# Patient Record
Sex: Female | Born: 1980
Health system: Southern US, Community
[De-identification: ages and names within clinical notes are randomized; demographics above are authoritative.]

## PROBLEM LIST (undated history)

## (undated) DIAGNOSIS — F419 Anxiety disorder, unspecified: Secondary | ICD-10-CM

## (undated) DIAGNOSIS — K589 Irritable bowel syndrome without diarrhea: Secondary | ICD-10-CM

## (undated) DIAGNOSIS — K449 Diaphragmatic hernia without obstruction or gangrene: Secondary | ICD-10-CM

## (undated) DIAGNOSIS — K219 Gastro-esophageal reflux disease without esophagitis: Secondary | ICD-10-CM

## (undated) DIAGNOSIS — F32A Depression, unspecified: Secondary | ICD-10-CM

## (undated) HISTORY — DX: Diaphragmatic hernia without obstruction or gangrene: K44.9

## (undated) HISTORY — DX: Gastro-esophageal reflux disease without esophagitis: K21.9

## (undated) HISTORY — DX: Irritable bowel syndrome, unspecified: K58.9

## (undated) HISTORY — DX: Depression, unspecified: F32.A

## (undated) HISTORY — DX: Anxiety disorder, unspecified: F41.9

## (undated) HISTORY — PX: TONSILLECTOMY: SUR1361

---

## 2015-10-22 DIAGNOSIS — F419 Anxiety disorder, unspecified: Secondary | ICD-10-CM | POA: Insufficient documentation

## 2017-10-17 ENCOUNTER — Ambulatory Visit: Payer: BLUE CROSS/BLUE SHIELD | Admitting: Psychiatry

## 2017-10-17 ENCOUNTER — Encounter: Payer: Self-pay | Admitting: Psychiatry

## 2017-10-17 VITALS — BP 104/62 | HR 68 | Ht 65.0 in | Wt 140.0 lb

## 2017-10-17 DIAGNOSIS — F411 Generalized anxiety disorder: Secondary | ICD-10-CM | POA: Diagnosis not present

## 2017-10-17 DIAGNOSIS — F330 Major depressive disorder, recurrent, mild: Secondary | ICD-10-CM | POA: Diagnosis not present

## 2017-10-17 DIAGNOSIS — F5105 Insomnia due to other mental disorder: Secondary | ICD-10-CM | POA: Diagnosis not present

## 2017-10-17 DIAGNOSIS — F4310 Post-traumatic stress disorder, unspecified: Secondary | ICD-10-CM

## 2017-10-17 MED ORDER — VILAZODONE HCL 20 MG PO TABS: 20.0000 mg | ORAL_TABLET | Freq: Every day | ORAL | 0 refills | Status: DC

## 2017-10-17 MED ORDER — TRAZODONE HCL 100 MG PO TABS: 100.0000 mg | ORAL_TABLET | Freq: Every day | ORAL | 1 refills | Status: DC

## 2017-10-17 MED ORDER — CLONAZEPAM 1 MG PO TABS: ORAL_TABLET | ORAL | 1 refills | Status: DC

## 2017-10-17 NOTE — Progress Notes (Signed)
Breanna Young [id number] 09-21-1980 37 y.o.  Assessment: Plan:    PTSD (post-traumatic stress disorder) - Plan: Vilazodone HCl (VIIBRYD) 20 MG TABS, clonazePAM (KLONOPIN) 1 MG tablet  Generalized anxiety disorder - Plan: Vilazodone HCl (VIIBRYD) 20 MG TABS, clonazePAM (KLONOPIN) 1 MG tablet  Major depressive disorder, recurrent episode, mild (HCC) - Plan: Vilazodone HCl (VIIBRYD) 20 MG TABS  Insomnia due to mental disorder - Plan: traZODone (DESYREL) 100 MG tablet  Please see After Visit Summary for patient specific instructions.  Future Appointments  Date Time Provider [place]  01/23/2018 11:30 AM [name], PMHNP [facility] None    No orders of the defined types were placed in this encounter.      Subjective:   Patient ID:  Breanna Young is a 37 y.o. (DOB 1980/06/24) female.  Chief Complaint:  Chief Complaint  Patient presents with  . Anxiety  . Depression  . Insomnia    Anxiety  Presents for follow-up visit. Symptoms include insomnia. Patient reports no confusion, decreased concentration, nervous/anxious behavior, panic or suicidal ideas.    Depression         This is a chronic problem.  The problem has been gradually improving since onset.  Associated symptoms include insomnia.  Associated symptoms include no decreased concentration, no fatigue, no appetite change and no suicidal ideas.  Compliance with treatment is good.  Past medical history includes anxiety.   Insomnia  Primary symptoms: no sleep disturbance.  The problem has been gradually improving since onset. The treatment provided significant relief. PMH includes: depression.   Breanna Young presents to the office today for f/u for depression and anxiety. She reports "I feel so much better." Reports improved mood and energy and feels "happier." Reports depressed mood is "minimal, if any." Reports that her boyfriend has been traveling frequently with new job and that previously this would have  caused her significant distress and she is coping better than expected. Reports "I feel like I am coping better with things." Reports decreased irritability. Notices mild irritability monthly when she thinks she would have a period if she did not have IUD. Reports improved anxiety. Reports some continued generalized anxiety and worry and reports that this has decreased. Reports that physical s/s of anxiety have improved. No recent panic attacks. Reports vivid dreams but no nightmares. Difficulty going to bed and feeling tired and able to fall asleep easily once she goes to bed. Reports that working out later at night after work may be causing difficulty falling asleep. Reports that Trazodone is effective for her insomnia. Describes appetite as "healthy" and reports eating an adequate amount.Reports eating healthy snacks throughout the day.  Reports exercising for at least an hour 4-5 times a week. Energy and motivation is "much better." Reports improved concentration and "I still struggle with it a little bit, but not like  I had before." Denies SI.     Medications: I have reviewed the patient's current medications.  Allergies:  Allergies  Allergen Reactions  . Avelox [Moxifloxacin Hcl In Nacl]   . Dexlansoprazole   . Doxazosin     Past Medical History:  Diagnosis Date  . GERD (gastroesophageal reflux disease)   . IBS (irritable bowel syndrome)     Past Surgical History:  Procedure Laterality Date  . TONSILLECTOMY      Family History  Problem Relation Age of Onset  . Depression Mother   . Heart attack Father   . Diabetes Father   . Depression Maternal Grandmother   .  Endometrial cancer Maternal Grandmother   . Dementia Maternal Grandfather   . Diabetes Maternal Grandfather   . Dementia Paternal Grandmother     Social History   Socioeconomic History  . Marital status: Single    Spouse name: Not on file  . Number of children: Not on file  . Years of education: Not on file  .  Highest education level: Not on file  Occupational History  . Occupation: hair stylist  Social Needs  . Financial resource strain: Not on file  . Food insecurity:    Worry: Not on file    Inability: Not on file  . Transportation needs:    Medical: Not on file    Non-medical: Not on file  Tobacco Use  . Smoking status: Never Smoker  . Smokeless tobacco: Never Used  Substance and Sexual Activity  . Alcohol use: Not on file  . Drug use: Not on file  . Sexual activity: Yes    Partners: Male    Birth control/protection: IUD  Lifestyle  . Physical activity:    Days per week: 4 days    Minutes per session: 60 min  . Stress: Not on file  Relationships  . Social connections:    Talks on phone: Not on file    Gets together: Not on file    Attends religious service: Not on file    Active member of club or organization: Not on file    Attends meetings of clubs or organizations: Not on file    Relationship status: Not on file  . Intimate partner violence:    Fear of current or ex partner: Not on file    Emotionally abused: Not on file    Physically abused: Not on file    Forced sexual activity: Not on file  Other Topics Concern  . Not on file  Social History Narrative  . Not on file    Past Medical History, Surgical history, Social history, and Family history were reviewed and updated as appropriate.   Please see review of systems for further details on the patient's review from today.   Review of Systems:  Review of Systems  Constitutional: Negative for activity change, appetite change, fatigue and unexpected weight change.  Musculoskeletal: Negative for gait problem.  Neurological: Negative for tremors.  Psychiatric/Behavioral: Positive for depression. Negative for agitation, behavioral problems, confusion, decreased concentration, dysphoric mood, hallucinations, sleep disturbance and suicidal ideas. The patient has insomnia. The patient is not nervous/anxious and is not  hyperactive.     Objective:   Physical Exam:  BP 104/62   Pulse 68   Ht 5\' 5"  (1.651 m)   Wt 140 lb (63.5 kg)   BMI 23.30 kg/m   Physical Exam  Constitutional: She is oriented to person, place, and time. She appears well-developed. No distress.  Musculoskeletal: Normal range of motion.  Neurological: She is alert and oriented to person, place, and time. Coordination normal.  Psychiatric: Her speech is normal and behavior is normal. Judgment and thought content normal. Her mood appears not anxious. Her affect is not angry, not blunt, not labile and not inappropriate. Cognition and memory are normal. She does not exhibit a depressed mood.    Lab Review:  No results found for: NA, K, CL, CO2, GLUCOSE, BUN, CREATININE, CALCIUM, PROT, ALBUMIN, AST, ALT, ALKPHOS, BILITOT, GFRNONAA, GFRAA  No results found for: WBC, RBC, HGB, HCT, PLT, MCV, MCH, MCHC, RDW, LYMPHSABS, MONOABS, EOSABS, BASOSABS -------------------------------

## 2017-11-07 ENCOUNTER — Ambulatory Visit (INDEPENDENT_AMBULATORY_CARE_PROVIDER_SITE_OTHER): Payer: BLUE CROSS/BLUE SHIELD | Admitting: Psychiatry

## 2017-11-07 ENCOUNTER — Encounter: Payer: Self-pay | Admitting: Psychiatry

## 2017-11-07 DIAGNOSIS — F411 Generalized anxiety disorder: Secondary | ICD-10-CM | POA: Diagnosis not present

## 2017-11-07 DIAGNOSIS — F4310 Post-traumatic stress disorder, unspecified: Secondary | ICD-10-CM | POA: Diagnosis not present
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
## 2017-11-14 ENCOUNTER — Ambulatory Visit: Payer: BLUE CROSS/BLUE SHIELD | Admitting: Psychiatry

## 2017-11-14 DIAGNOSIS — F411 Generalized anxiety disorder: Secondary | ICD-10-CM | POA: Diagnosis not present

## 2017-11-14 DIAGNOSIS — F4310 Post-traumatic stress disorder, unspecified: Secondary | ICD-10-CM
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
## 2017-11-28 ENCOUNTER — Ambulatory Visit: Payer: BLUE CROSS/BLUE SHIELD | Admitting: Psychiatry

## 2017-11-28 DIAGNOSIS — F4310 Post-traumatic stress disorder, unspecified: Secondary | ICD-10-CM

## 2017-11-28 DIAGNOSIS — F411 Generalized anxiety disorder: Secondary | ICD-10-CM | POA: Diagnosis not present
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
## 2017-12-12 ENCOUNTER — Encounter: Payer: Self-pay | Admitting: Psychiatry

## 2017-12-12 ENCOUNTER — Ambulatory Visit: Payer: BLUE CROSS/BLUE SHIELD | Admitting: Psychiatry

## 2017-12-12 DIAGNOSIS — F4310 Post-traumatic stress disorder, unspecified: Secondary | ICD-10-CM

## 2017-12-12 NOTE — Progress Notes (Signed)
      [facility] Counselor/Therapist Progress Note  Patient ID: Breanna Young, MRN: [id number],    Date: 12/12/2017  Time Spent: 46 minutes  Treatment Type: Individual Therapy  Reported Symptoms: Anxious Mood and Fatigue,depression, anger, low motivation  Mental Status Exam:  Appearance:   Well Groomed     Behavior:  Appropriate  Motor:  Normal  Speech/Language:   Normal Rate  Affect:  Appropriate  Mood:  sad  Thought process:  normal  Thought content:    WNL  Sensory/Perceptual disturbances:    WNL  Orientation:  oriented to person, place and time/date  Attention:  Good  Concentration:  Good  Memory:  WNL  Fund of knowledge:   Good  Insight:    Good  Judgment:   Good  Impulse Control:  Good   Risk Assessment: Danger to Self:  No Self-injurious Behavior: No Danger to Others: No Duty to Warn:no Physical Aggression / Violence:No  Access to Firearms a concern: No  Gang Involvement:No   Subjective: Patient was present for session.  She stated at the beginning of session she was not feeling well and was not certain if she had a stomach virus or had food poisoning.  Agreed to keep the session light due to that reason.  Patient also reported that she is having lots of issues with being easily agitated.  She reported she is concerned that some of the agitation is coming because past memories are flashing up for her and she is becoming angrier with her mother.  Patient was allowed time to realize that the emotions are appropriate but need to be released in a good manner.  Discussed different ways to release her anger appropriately throughout her day.  Patient was encouraged to remember the emotions are going to come up very quickly so she needs to be proactive about releasing things regularly.  Ways to do that were discussed with patient.  Patient was given a list of options to take home and work on daily.  Patient was also encouraged to focus on her eating sleep and exercise.   Patient did admit that the nightmares have stopped which was very important for her.  Ways to continue taking care of herself were discussed with patient.  Interventions: Solution-Oriented/Positive Psychology  Diagnosis:   ICD-10-CM   1. PTSD (post-traumatic stress disorder) F43.10     Plan: 1.  Patient to continue to engage in individual counseling 2-4 times a month or as needed. 2.  Patient to identify and apply CBT, coping skills learned in session to decrease anxiety symptoms. 3.  Patient to contact this office, go to the local ED or call 911 if a crisis or emergency develops between visits.  Lina Sayre, [place]

## 2018-01-01 ENCOUNTER — Encounter: Payer: Self-pay | Admitting: Emergency Medicine

## 2018-01-01 DIAGNOSIS — F4310 Post-traumatic stress disorder, unspecified: Secondary | ICD-10-CM

## 2018-01-01 DIAGNOSIS — F332 Major depressive disorder, recurrent severe without psychotic features: Secondary | ICD-10-CM | POA: Insufficient documentation

## 2018-01-01 DIAGNOSIS — G4700 Insomnia, unspecified: Secondary | ICD-10-CM

## 2018-01-01 DIAGNOSIS — F410 Panic disorder [episodic paroxysmal anxiety] without agoraphobia: Secondary | ICD-10-CM | POA: Insufficient documentation

## 2018-01-02 ENCOUNTER — Encounter: Payer: Self-pay | Admitting: Psychiatry

## 2018-01-02 ENCOUNTER — Ambulatory Visit: Payer: BLUE CROSS/BLUE SHIELD | Admitting: Psychiatry

## 2018-01-02 DIAGNOSIS — F4310 Post-traumatic stress disorder, unspecified: Secondary | ICD-10-CM | POA: Diagnosis not present

## 2018-01-02 NOTE — Progress Notes (Signed)
      [facility] Counselor/Therapist Progress Note  Patient ID: Breanna Young, MRN: [id number],    Date: 01/02/2018  Time Spent: 52 minutes   Treatment Type: Individual Therapy  Reported Symptoms: Anxious Mood  Mental Status Exam:  Appearance:   Well Groomed     Behavior:  Appropriate  Motor:  Normal  Speech/Language:   Normal Rate  Affect:  Appropriate  Mood:  normal  Thought process:  normal  Thought content:    WNL  Sensory/Perceptual disturbances:    WNL  Orientation:  oriented to person, place and time/date  Attention:  Good  Concentration:  Good  Memory:  WNL  Fund of knowledge:   Good  Insight:    Good  Judgment:   Good  Impulse Control:  Good   Risk Assessment: Danger to Self:  No Self-injurious Behavior: No Danger to Others: No Duty to Warn:no Physical Aggression / Violence:No  Access to Firearms a concern: No  Gang Involvement:No   Subjective: Patient was present for session.  Patient reported she is having lots of issues with the anticipation of Christmas.  She shared she admitted through Thanksgiving but it was very difficult with her mother.  Also 1 of her pets passed away and that has been very difficult.  Patient shared that she did use her coping skills and found them to be helpful.  She was able to discuss some coping skills that can continue during her Christmas visit with her mother.  Other solutions were also discussed with patient.  Patient reported she had gotten to the point where she started having some negative thoughts again and she wanted to make sure that that did not happen.  The importance of releasing her emotions in healthy manners through exercise were discussed with patient and she agreed to get back into her regular routine at this time.  Interventions: Cognitive Behavioral Therapy and Solution-Oriented/Positive Psychology  Diagnosis:   ICD-10-CM   1. Posttraumatic stress disorder F43.10     Plan: 1.  Patient to continue to  engage in individual counseling 2-4 times a month or as needed. 2.  Patient to identify and apply CBT, coping skills learned in session to decrease triggered responses and anxiety symptoms. 3.  Patient to contact this office, go to the local ED or call 911 if a crisis or emergency develops between visits.  Lina Sayre, [place]

## 2018-01-23 ENCOUNTER — Encounter: Payer: Self-pay | Admitting: Psychiatry

## 2018-01-23 ENCOUNTER — Ambulatory Visit: Payer: BLUE CROSS/BLUE SHIELD | Admitting: Psychiatry

## 2018-01-23 VITALS — BP 115/71 | HR 72

## 2018-01-23 DIAGNOSIS — F330 Major depressive disorder, recurrent, mild: Secondary | ICD-10-CM | POA: Diagnosis not present

## 2018-01-23 DIAGNOSIS — F5105 Insomnia due to other mental disorder: Secondary | ICD-10-CM | POA: Diagnosis not present

## 2018-01-23 DIAGNOSIS — F411 Generalized anxiety disorder: Secondary | ICD-10-CM

## 2018-01-23 DIAGNOSIS — F4310 Post-traumatic stress disorder, unspecified: Secondary | ICD-10-CM

## 2018-01-23 MED ORDER — TRAZODONE HCL 100 MG PO TABS: 100.0000 mg | ORAL_TABLET | Freq: Every day | ORAL | 1 refills | Status: DC

## 2018-01-23 MED ORDER — CLONAZEPAM 1 MG PO TABS: ORAL_TABLET | ORAL | 2 refills | Status: DC

## 2018-01-23 MED ORDER — VILAZODONE HCL 20 MG PO TABS: 20.0000 mg | ORAL_TABLET | Freq: Every day | ORAL | 0 refills | Status: DC

## 2018-01-23 NOTE — Progress Notes (Signed)
Breanna Young [id number] 1980/04/05 38 y.o.  Subjective:   Patient ID:  Breanna Young is a 38 y.o. (DOB 10/14/1980) female.  Chief Complaint:  Chief Complaint  Patient presents with  . Anxiety  . Insomnia  . Depression    HPI Breanna Young presents to the office today for follow-up of depression and anxiety. She noticed increased irritability a few months ago. She reports that irritability has improved and thinks that some of the irritability may have been related to working through some issues in therapy. She reports that she had some nightmares and panic attacks in the middle of the night while doing more intense work from therapy. Reports that her motivation was lower around that time. She reports that her motivation is gradually improving. Reports that she had some anxiety over the holidays and that they holidays are typically a difficult time for her. She reports that she has some occasional mild irritability but what she would consider appropriate.   She reports having the start of a panic attack earlier this week while sleeping. Reports that she then had to take a klonopin during the middle of the night. She describes generalized anxiety as "middle of the road" and "not great." She reports that she has been feeling easily overwhelmed. She reports that anxiety was more manageable when she was exercising regularly and is trying to get back to that. Energy has been low. "I feel kind of sluggish, tired." Reports that she has been having difficulty falling asleep since boyfriend left for travel with work several days ago. She reports that her appetite has been "ok." Reports concentration has been "all over the place." Denies SI.   Reports that boyfriend was home for a few weeks around the holidays.  Reports that sexual side effects have decreased with Viibryd compared to Trintellix.   Past Psychiatric Medication Trials: Viibryd Trintellix Zoloft-sexual side effects Lexapro-adverse  reaction Wellbutrin XL-increased anxiety and felt fuzzy headed Rexulti-affect of dulling, weight gain Trazodone Ativan Xanax Klonopin Doxazosin-dissociation BuSpar-adverse reaction   Review of Systems:  Review of Systems  Gastrointestinal: Positive for diarrhea.  Musculoskeletal: Negative for gait problem.  Neurological: Negative for tremors.  Psychiatric/Behavioral:       Please refer to HPI    Medications: I have reviewed the patient's current medications.  Current Outpatient Medications  Medication Sig Dispense Refill  . CALCIUM-MAGNESIUM PO Take by mouth.    . clonazePAM (KLONOPIN) 1 MG tablet TAKE 1 TAB PO BID AND 1 TAB PO QD PRN ANXIETY 75 tablet 2  . Glucosamine-Chondroitin (GLUCOSAMINE CHONDR COMPLEX PO) Take 3 tablets by mouth.    . Lactobacillus (PROBIOTIC ACIDOPHILUS PO) Take by mouth.    . Multiple Vitamin (MULTIVITAMIN) LIQD Take 5 mLs by mouth daily.    Marland Kitchen omega-3 fish oil (MAXEPA) 1000 MG CAPS capsule Take 2 capsules by mouth daily.    . pantoprazole (PROTONIX) 40 MG tablet Take 40 mg by mouth daily.    . traZODone (DESYREL) 100 MG tablet Take 1 tablet (100 mg total) by mouth at bedtime. 90 tablet 1  . Vilazodone HCl (VIIBRYD) 20 MG TABS Take 1 tablet (20 mg total) by mouth daily. 90 tablet 0   No current facility-administered medications for this visit.     Medication Side Effects: None  Allergies:  Allergies  Allergen Reactions  . Avelox [Moxifloxacin Hcl In Nacl]   . Dexlansoprazole   . Doxazosin     Past Medical History:  Diagnosis Date  . GERD (gastroesophageal reflux disease)   .  IBS (irritable bowel syndrome)     Family History  Problem Relation Age of Onset  . Depression Mother   . Heart attack Father   . Diabetes Father   . Depression Maternal Grandmother   . Endometrial cancer Maternal Grandmother   . Dementia Maternal Grandfather   . Diabetes Maternal Grandfather   . Dementia Paternal Grandmother     Social History    Socioeconomic History  . Marital status: Single    Spouse name: Not on file  . Number of children: Not on file  . Years of education: Not on file  . Highest education level: Not on file  Occupational History  . Occupation: hair stylist  Social Needs  . Financial resource strain: Not on file  . Food insecurity:    Worry: Not on file    Inability: Not on file  . Transportation needs:    Medical: Not on file    Non-medical: Not on file  Tobacco Use  . Smoking status: Never Smoker  . Smokeless tobacco: Never Used  Substance and Sexual Activity  . Alcohol use: Not Currently    Alcohol/week: 2.0 standard drinks    Types: 2 Glasses of wine per week  . Drug use: Never  . Sexual activity: Yes    Partners: Male    Birth control/protection: I.U.D.  Lifestyle  . Physical activity:    Days per week: 4 days    Minutes per session: 60 min  . Stress: Not on file  Relationships  . Social connections:    Talks on phone: Not on file    Gets together: Not on file    Attends religious service: Not on file    Active member of club or organization: Not on file    Attends meetings of clubs or organizations: Not on file    Relationship status: Not on file  . Intimate partner violence:    Fear of current or ex partner: Not on file    Emotionally abused: Not on file    Physically abused: Not on file    Forced sexual activity: Not on file  Other Topics Concern  . Not on file  Social History Narrative  . Not on file    Past Medical History, Surgical history, Social history, and Family history were reviewed and updated as appropriate.   Please see review of systems for further details on the patient's review from today.   Objective:   Physical Exam:  BP 115/71   Pulse 72   Physical Exam Constitutional:      General: She is not in acute distress.    Appearance: She is well-developed.  Musculoskeletal:        General: No deformity.  Neurological:     Mental Status: She is  alert and oriented to person, place, and time.     Coordination: Coordination normal.  Psychiatric:        Mood and Affect: Mood is anxious and depressed. Affect is not labile, blunt, angry or inappropriate.        Speech: Speech normal.        Behavior: Behavior normal.        Thought Content: Thought content normal. Thought content does not include homicidal or suicidal ideation. Thought content does not include homicidal or suicidal plan.        Judgment: Judgment normal.     Comments: Insight intact. No auditory or visual hallucinations. No delusions.      Lab Review:  No  results found for: NA, K, CL, CO2, GLUCOSE, BUN, CREATININE, CALCIUM, PROT, ALBUMIN, AST, ALT, ALKPHOS, BILITOT, GFRNONAA, GFRAA  No results found for: WBC, RBC, HGB, HCT, PLT, MCV, MCH, MCHC, RDW, LYMPHSABS, MONOABS, EOSABS, BASOSABS  No results found for: POCLITH, LITHIUM   No results found for: PHENYTOIN, PHENOBARB, VALPROATE, CBMZ   .res Assessment: Plan:   Discussed option of increasing Viibryd to 40 mg daily or continuing current medications.  Patient reports that she would like to continue current medications and increase exercise and improve diet for the next month and then reevaluate possibly increasing Viibryd, particularly since she has noticed some recent improvement in mood and anxiety signs and symptoms.  Patient advised to contact office if signs and symptoms worsen. Continue Viibryd 20 mg daily. Continue Klonopin 1 mg twice daily and 1 tab p.o. daily as needed anxiety. Continue trazodone 100 mg p.o. nightly for insomnia.  PTSD (post-traumatic stress disorder) - Chronic with recent exacerbation - Plan: Vilazodone HCl (VIIBRYD) 20 MG TABS, clonazePAM (KLONOPIN) 1 MG tablet  Generalized anxiety disorder - Chronic with some recent worsening - Plan: Vilazodone HCl (VIIBRYD) 20 MG TABS, clonazePAM (KLONOPIN) 1 MG tablet  Major depressive disorder, recurrent episode, mild (HCC) - Chronic with some  recent worsening - Plan: Vilazodone HCl (VIIBRYD) 20 MG TABS  Insomnia due to mental disorder - Chronic with some recent intermittent awakenings due to anxiety - Plan: traZODone (DESYREL) 100 MG tablet  Please see After Visit Summary for patient specific instructions.  Future Appointments  Date Time Provider [place]  02/13/2018 12:00 PM Lina Sayre, [place] [facility] None  02/20/2018  3:00 PM Lina Sayre, [place] [facility] None  02/27/2018  1:00 PM [name], PMHNP CP-CP None  03/06/2018  1:00 PM Lina Sayre, [facility] None    No orders of the defined types were placed in this encounter.     -------------------------------

## 2018-01-30 ENCOUNTER — Ambulatory Visit: Payer: BLUE CROSS/BLUE SHIELD | Admitting: Psychiatry

## 2018-02-13 ENCOUNTER — Ambulatory Visit: Payer: BLUE CROSS/BLUE SHIELD | Admitting: Psychiatry

## 2018-02-13 ENCOUNTER — Encounter: Payer: Self-pay | Admitting: Psychiatry

## 2018-02-13 DIAGNOSIS — F4310 Post-traumatic stress disorder, unspecified: Secondary | ICD-10-CM | POA: Diagnosis not present
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
## 2018-02-20 ENCOUNTER — Ambulatory Visit: Payer: BLUE CROSS/BLUE SHIELD | Admitting: Psychiatry

## 2018-02-27 ENCOUNTER — Encounter: Payer: Self-pay | Admitting: Psychiatry

## 2018-02-27 ENCOUNTER — Ambulatory Visit: Payer: BLUE CROSS/BLUE SHIELD | Admitting: Psychiatry

## 2018-02-27 VITALS — BP 95/60 | HR 70

## 2018-02-27 DIAGNOSIS — F411 Generalized anxiety disorder: Secondary | ICD-10-CM | POA: Diagnosis not present

## 2018-02-27 DIAGNOSIS — F330 Major depressive disorder, recurrent, mild: Secondary | ICD-10-CM | POA: Diagnosis not present

## 2018-02-27 DIAGNOSIS — F4310 Post-traumatic stress disorder, unspecified: Secondary | ICD-10-CM | POA: Diagnosis not present

## 2018-02-27 MED ORDER — CLONAZEPAM 1 MG PO TABS: ORAL_TABLET | ORAL | 2 refills | Status: DC

## 2018-02-27 MED ORDER — VILAZODONE HCL 40 MG PO TABS: 40.0000 mg | ORAL_TABLET | Freq: Every day | ORAL | 0 refills | Status: DC

## 2018-02-27 NOTE — Progress Notes (Signed)
Breanna Young [id number] Jul 25, 1980 38 y.o.  Subjective:   Patient ID:  Breanna Young is a 38 y.o. (DOB [birth year]) female.  Chief Complaint:  Chief Complaint  Patient presents with  . Depression  . Insomnia  . Anxiety    HPI Breanna Young presents to the office today for follow-up of depression and anxiety. She reports "I think it is definitely time for use to go up" on Viibryd and notices depression. She reports that her energy and motivation remain low and she has not been able to get back on a set routine of exercise and health maintenance. Reports that she not feel that she has the energy to go to work. She reports that she has not been sleeping well and wakes up feeling very tired. Reports difficulty falling asleep and does not feel tired until much later than she would like to fall asleep. Reports that she has tried multiple strategies to help fall asleep to include apps, TV on and off, total darkness and silence, etc. Reports some occasional difficulty staying asleep. Reports that she has had a few nightmares. She reports that she tried taking Benadryl instead of Trazodone one night and "that was terrible" and had itching, diarrhea, and sweats and thinks that she had discontinuation s/s. Estimates sleeping 5-6 hours of sleep a night on average. Reports that even when she is able to sleep she does not feel rested or refreshed on awakening. Reports that her anxiety is "so, so." Reports that she has had several recent stressors to include multiple home repairs. Reports poor concentration and notices that she is often not paying attention to clients. Appetite has been "up and down." Reports that she has been craving unhealthy foods at times. Denies SI.  Past Psychiatric Medication Trials: Viibryd Trintellix Zoloft-sexual side effects Lexapro-adverse reaction Wellbutrin XL-increased anxiety and felt fuzzy headed Rexulti-affect of dulling, weight  gain Trazodone Ativan Xanax Klonopin Doxazosin-dissociation BuSpar-adverse reaction  Review of Systems:  Review of Systems  Musculoskeletal: Negative for gait problem.  Neurological: Positive for headaches. Negative for tremors.       Reports increase in dull HA's.  Psychiatric/Behavioral:       Please refer to HPI    Medications: I have reviewed the patient's current medications.  Current Outpatient Medications  Medication Sig Dispense Refill  . CALCIUM-MAGNESIUM PO Take by mouth.    Derrill Memo ON 04/24/2018] clonazePAM (KLONOPIN) 1 MG tablet TAKE 1 TAB PO BID AND 1 TAB PO QD PRN ANXIETY 75 tablet 2  . Glucosamine-Chondroitin (GLUCOSAMINE CHONDR COMPLEX PO) Take 3 tablets by mouth.    . Lactobacillus (PROBIOTIC ACIDOPHILUS PO) Take by mouth.    . Multiple Vitamin (MULTIVITAMIN) LIQD Take 5 mLs by mouth daily.    Marland Kitchen omega-3 fish oil (MAXEPA) 1000 MG CAPS capsule Take 2 capsules by mouth daily.    . pantoprazole (PROTONIX) 40 MG tablet Take 40 mg by mouth daily.    . traZODone (DESYREL) 100 MG tablet Take 1 tablet (100 mg total) by mouth at bedtime. 90 tablet 1  . Vilazodone HCl (VIIBRYD) 40 MG TABS Take 1 tablet (40 mg total) by mouth daily. 90 tablet 0   No current facility-administered medications for this visit.     Medication Side Effects: None  Allergies:  Allergies  Allergen Reactions  . Avelox [Moxifloxacin Hcl In Nacl]   . Dexlansoprazole   . Doxazosin     Past Medical History:  Diagnosis Date  . GERD (gastroesophageal reflux disease)   . IBS (irritable  bowel syndrome)     Family History  Problem Relation Age of Onset  . Depression Mother   . Heart attack Father   . Diabetes Father   . Depression Maternal Grandmother   . Endometrial cancer Maternal Grandmother   . Dementia Maternal Grandfather   . Diabetes Maternal Grandfather   . Dementia Paternal Grandmother     Social History   Socioeconomic History  . Marital status: Single    Spouse name: Not  on file  . Number of children: Not on file  . Years of education: Not on file  . Highest education level: Not on file  Occupational History  . Occupation: hair stylist  Social Needs  . Financial resource strain: Not on file  . Food insecurity:    Worry: Not on file    Inability: Not on file  . Transportation needs:    Medical: Not on file    Non-medical: Not on file  Tobacco Use  . Smoking status: Never Smoker  . Smokeless tobacco: Never Used  Substance and Sexual Activity  . Alcohol use: Not Currently    Alcohol/week: 2.0 standard drinks    Types: 2 Glasses of wine per week  . Drug use: Never  . Sexual activity: Yes    Partners: Male    Birth control/protection: I.U.D.  Lifestyle  . Physical activity:    Days per week: 4 days    Minutes per session: 60 min  . Stress: Not on file  Relationships  . Social connections:    Talks on phone: Not on file    Gets together: Not on file    Attends religious service: Not on file    Active member of club or organization: Not on file    Attends meetings of clubs or organizations: Not on file    Relationship status: Not on file  . Intimate partner violence:    Fear of current or ex partner: Not on file    Emotionally abused: Not on file    Physically abused: Not on file    Forced sexual activity: Not on file  Other Topics Concern  . Not on file  Social History Narrative  . Not on file    Past Medical History, Surgical history, Social history, and Family history were reviewed and updated as appropriate.   Please see review of systems for further details on the patient's review from today.   Objective:   Physical Exam:  BP 95/60   Pulse 70   Physical Exam Constitutional:      General: She is not in acute distress.    Appearance: She is well-developed.  Musculoskeletal:        General: No deformity.  Neurological:     Mental Status: She is alert and oriented to person, place, and time.     Coordination: Coordination  normal.  Psychiatric:        Attention and Perception: Attention and perception normal. She does not perceive auditory or visual hallucinations.        Mood and Affect: Mood is anxious and depressed. Affect is not labile, blunt, angry or inappropriate.        Speech: Speech normal.        Behavior: Behavior normal.        Thought Content: Thought content normal. Thought content does not include homicidal or suicidal ideation. Thought content does not include homicidal or suicidal plan.        Cognition and Memory: Cognition and memory  normal.        Judgment: Judgment normal.     Comments: Insight intact. No delusions.      Lab Review:  No results found for: NA, K, CL, CO2, GLUCOSE, BUN, CREATININE, CALCIUM, PROT, ALBUMIN, AST, ALT, ALKPHOS, BILITOT, GFRNONAA, GFRAA  No results found for: WBC, RBC, HGB, HCT, PLT, MCV, MCH, MCHC, RDW, LYMPHSABS, MONOABS, EOSABS, BASOSABS  No results found for: POCLITH, LITHIUM   No results found for: PHENYTOIN, PHENOBARB, VALPROATE, CBMZ   .res Assessment: Plan:   Discussed potential benefits, risks, and side effects of increasing Viibryd to 40 mg po qd to improve anxiety and depressive s/s since these s/s have partially improved with Viibryd 20 mg po qd. Pt agrees to increase in Viibryd to 40 mg po qd. Discussed insomnia may be due to acute exacerbation of anxiety and depression and may improve if increase in Viibryd is effective for these s/s. Discussed considering tx options for insomnia if sleep has not improved at time of f/u visit.  Continue Klonopin for anxiety. Recommend continuing therapy with Lina Sayre, LPC.   Major depressive disorder, recurrent episode, mild (HCC) - Plan: Vilazodone HCl (VIIBRYD) 40 MG TABS  PTSD (post-traumatic stress disorder) - Chronic with recent exacerbation - Plan: Vilazodone HCl (VIIBRYD) 40 MG TABS, clonazePAM (KLONOPIN) 1 MG tablet  Generalized anxiety disorder - Chronic with some recent worsening - Plan:  Vilazodone HCl (VIIBRYD) 40 MG TABS, clonazePAM (KLONOPIN) 1 MG tablet  Please see After Visit Summary for patient specific instructions.  Future Appointments  Date Time Provider [place]  03/06/2018  1:00 PM Lina Sayre, [place] [facility] None  04/03/2018  1:00 PM [name], PMHNP CP-CP None    No orders of the defined types were placed in this encounter.     -------------------------------

## 2018-03-06 ENCOUNTER — Ambulatory Visit: Payer: BLUE CROSS/BLUE SHIELD | Admitting: Psychiatry

## 2018-03-06 ENCOUNTER — Encounter: Payer: Self-pay | Admitting: Psychiatry

## 2018-03-06 DIAGNOSIS — F411 Generalized anxiety disorder: Secondary | ICD-10-CM | POA: Diagnosis not present

## 2018-03-06 DIAGNOSIS — F4310 Post-traumatic stress disorder, unspecified: Secondary | ICD-10-CM | POA: Diagnosis not present
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
## 2018-03-20 ENCOUNTER — Ambulatory Visit: Payer: BLUE CROSS/BLUE SHIELD | Admitting: Psychiatry

## 2018-04-03 ENCOUNTER — Other Ambulatory Visit: Payer: Self-pay

## 2018-04-03 ENCOUNTER — Ambulatory Visit: Payer: BLUE CROSS/BLUE SHIELD | Admitting: Psychiatry

## 2018-04-03 ENCOUNTER — Encounter: Payer: Self-pay | Admitting: Psychiatry

## 2018-04-03 VITALS — Wt 143.0 lb

## 2018-04-03 DIAGNOSIS — F5105 Insomnia due to other mental disorder: Secondary | ICD-10-CM | POA: Diagnosis not present

## 2018-04-03 DIAGNOSIS — F330 Major depressive disorder, recurrent, mild: Secondary | ICD-10-CM

## 2018-04-03 DIAGNOSIS — F4310 Post-traumatic stress disorder, unspecified: Secondary | ICD-10-CM

## 2018-04-03 DIAGNOSIS — F411 Generalized anxiety disorder: Secondary | ICD-10-CM

## 2018-04-03 MED ORDER — LAMOTRIGINE 25 MG PO TABS: ORAL_TABLET | ORAL | 0 refills | Status: DC

## 2018-04-03 MED ORDER — VILAZODONE HCL 20 MG PO TABS: 30.0000 mg | ORAL_TABLET | Freq: Every day | ORAL | 0 refills | Status: DC

## 2018-04-03 MED ORDER — LAMOTRIGINE 100 MG PO TABS: ORAL_TABLET | ORAL | 0 refills | Status: DC

## 2018-04-03 NOTE — Progress Notes (Signed)
Breanna Young [id number] 12-06-1980 38 y.o.  Subjective:   Patient ID:  Breanna Young is a 38 y.o. (DOB 04/23/1980) female.  Chief Complaint:  Chief Complaint  Patient presents with  . Depression  . Anxiety  . Insomnia    HPI Breanna Young presents to the office today for follow-up of depression and anxiety. She reports that nightmares and vivid dreams stopped at higher dose. She reports feeling some "numb... lack of emotion" on multiple antidepressants. Has been more productive and sleep has improved at 40 mg po qd but notices some increase in affective dulling. Reports mood feels more "numb" than down currently. Reports that on Saturday she woke up early in the morning and was hysterically laughing and then cried uncontrollably off and on for the rest of the day. She reports that she has periods of feeling as if she is on the verge of tears. Energy and motivation have been better. She reports Viibryd has helped with anxiety. She reports that she is having some situational anxiety with COVID 19 and thinking about loss of income if work closes. Denies any recent panic attacks. Reports that now once she falls asleep she typically stays asleep. Reports less difficulty falling asleep. Estimates sleeping 7-9 hours a night. Describes appetite as "so, so." Denies change in appetite. Concentration has improved with Viibryd 40 mg "but I struggle with it still." Reports that she notices she may drift off while other people are talking. Denies SI.   Past Psychiatric Medication Trials: Viibryd- More effective at 40 mg dose and then had more "fogginess" and some sexual side effective. Not effective at 20 mg.  Trintellix- Helpful for depression Zoloft-sexual side effects. Felt more "upbeat."  Lexapro-adverse reaction Wellbutrin XL-increased anxiety and felt fuzzy headed Rexulti-affect of dulling, weight gain Trazodone Ativan Xanax Klonopin Doxazosin-dissociation BuSpar-adverse reaction  Review  of Systems:  Review of Systems  Musculoskeletal: Negative for gait problem.  Allergic/Immunologic: Positive for environmental allergies.  Neurological: Negative for tremors.       Occ HA's.  Psychiatric/Behavioral:       Please refer to HPI    Medications: I have reviewed the patient's current medications.  Current Outpatient Medications  Medication Sig Dispense Refill  . CALCIUM-MAGNESIUM PO Take by mouth.    Derrill Memo ON 04/24/2018] clonazePAM (KLONOPIN) 1 MG tablet TAKE 1 TAB PO BID AND 1 TAB PO QD PRN ANXIETY 75 tablet 2  . Glucosamine-Chondroitin (GLUCOSAMINE CHONDR COMPLEX PO) Take 3 tablets by mouth.    . Lactobacillus (PROBIOTIC ACIDOPHILUS PO) Take by mouth.    . Multiple Vitamin (MULTIVITAMIN) LIQD Take 5 mLs by mouth daily.    Marland Kitchen omega-3 fish oil (MAXEPA) 1000 MG CAPS capsule Take 2 capsules by mouth daily.    . pantoprazole (PROTONIX) 40 MG tablet Take 40 mg by mouth daily.    . traZODone (DESYREL) 100 MG tablet Take 1 tablet (100 mg total) by mouth at bedtime. 90 tablet 1  . Vilazodone HCl (VIIBRYD) 40 MG TABS Take 1 tablet (40 mg total) by mouth daily. 90 tablet 0  . [START ON 04/18/2018] lamoTRIgine (LAMICTAL) 100 MG tablet Take 1 tablet po qd x 2 weeks, then 1.5 tabs po qd 45 tablet 0  . lamoTRIgine (LAMICTAL) 25 MG tablet Take 1 tablet (25 mg total) by mouth daily for 14 days, THEN 2 tablets (50 mg total) daily for 14 days. 45 tablet 0  . Vilazodone HCl (VIIBRYD) 20 MG TABS Take 1.5 tablets (30 mg total) by mouth daily. [place]  tablet 0   No current facility-administered medications for this visit.     Medication Side Effects: Other: Some fogginess and sexual side effects.   Allergies:  Allergies  Allergen Reactions  . Avelox [Moxifloxacin Hcl In Nacl]   . Dexlansoprazole   . Doxazosin     Past Medical History:  Diagnosis Date  . GERD (gastroesophageal reflux disease)   . IBS (irritable bowel syndrome)     Family History  Problem Relation Age of Onset  .  Depression Mother   . Heart attack Father   . Diabetes Father   . Depression Maternal Grandmother   . Endometrial cancer Maternal Grandmother   . Dementia Maternal Grandfather   . Diabetes Maternal Grandfather   . Dementia Paternal Grandmother     Social History   Socioeconomic History  . Marital status: Single    Spouse name: Not on file  . Number of children: Not on file  . Years of education: Not on file  . Highest education level: Not on file  Occupational History  . Occupation: hair stylist  Social Needs  . Financial resource strain: Not on file  . Food insecurity:    Worry: Not on file    Inability: Not on file  . Transportation needs:    Medical: Not on file    Non-medical: Not on file  Tobacco Use  . Smoking status: Never Smoker  . Smokeless tobacco: Never Used  Substance and Sexual Activity  . Alcohol use: Not Currently    Alcohol/week: 2.0 standard drinks    Types: 2 Glasses of wine per week  . Drug use: Never  . Sexual activity: Yes    Partners: Male    Birth control/protection: I.U.D.  Lifestyle  . Physical activity:    Days per week: 4 days    Minutes per session: 60 min  . Stress: Not on file  Relationships  . Social connections:    Talks on phone: Not on file    Gets together: Not on file    Attends religious service: Not on file    Active member of club or organization: Not on file    Attends meetings of clubs or organizations: Not on file    Relationship status: Not on file  . Intimate partner violence:    Fear of current or ex partner: Not on file    Emotionally abused: Not on file    Physically abused: Not on file    Forced sexual activity: Not on file  Other Topics Concern  . Not on file  Social History Narrative  . Not on file    Past Medical History, Surgical history, Social history, and Family history were reviewed and updated as appropriate.   Please see review of systems for further details on the patient's review from today.    Objective:   Physical Exam:  Wt 143 lb (64.9 kg)   BMI 23.80 kg/m   Physical Exam Constitutional:      General: She is not in acute distress.    Appearance: She is well-developed.  Musculoskeletal:        General: No deformity.  Neurological:     Mental Status: She is alert and oriented to person, place, and time.     Coordination: Coordination normal.  Psychiatric:        Attention and Perception: Attention and perception normal. She does not perceive auditory or visual hallucinations.        Mood and Affect: Mood is anxious.  Affect is not labile, blunt, angry or inappropriate.        Speech: Speech normal.        Behavior: Behavior normal.        Thought Content: Thought content normal. Thought content does not include homicidal or suicidal ideation. Thought content does not include homicidal or suicidal plan.        Cognition and Memory: Cognition and memory normal.        Judgment: Judgment normal.     Comments: Insight intact. No delusions.      Lab Review:  No results found for: NA, K, CL, CO2, GLUCOSE, BUN, CREATININE, CALCIUM, PROT, ALBUMIN, AST, ALT, ALKPHOS, BILITOT, GFRNONAA, GFRAA  No results found for: WBC, RBC, HGB, HCT, PLT, MCV, MCH, MCHC, RDW, LYMPHSABS, MONOABS, EOSABS, BASOSABS  No results found for: POCLITH, LITHIUM   No results found for: PHENYTOIN, PHENOBARB, VALPROATE, CBMZ   .res Assessment: Plan:   Discussed several treatment options with patient since she reports that mood and anxiety signs and symptoms have partially improved with Viibryd but she continues to experience some mild signs and symptoms and have increased difficulty with concentration and affect of dulling which she has had on most other antidepressants tried.  Counseled patient regarding potential benefits, risks, and side effects of Lamictal to include potential risk of Stevens-Johnson syndrome. Advised patient to stop taking Lamictal and contact office immediately if rash develops  and to seek urgent medical attention if rash is severe and/or spreading quickly.  Discussed that Lamictal can be helpful for treatment resistant depression and typically is not associated with the side effects that she is most concerned about to include sexual side effects and affect of dulling.  Patient agrees to trial of Lamictal. Will start Lamictal 25 mg daily for 2 weeks and then increase to 50 mg daily for 2 weeks, with plan to then increase to 100 mg daily for 2 weeks and then 150 mg daily. Will decrease Viibryd to 30 mg daily to potentially minimize side effects since she is experiencing some intolerable side effects at 40 mg dose, however 20 mg daily did not adequately control mood and anxiety signs and symptoms. Continue Klonopin for anxiety Continue trazodone for insomnia. Recommend continuing psychotherapy with Lina Sayre, [place].  Major depressive disorder, recurrent episode, mild (HCC) - Plan: lamoTRIgine (LAMICTAL) 25 MG tablet, lamoTRIgine (LAMICTAL) 100 MG tablet, Vilazodone HCl (VIIBRYD) 20 MG TABS  PTSD (post-traumatic stress disorder) - Plan: Vilazodone HCl (VIIBRYD) 20 MG TABS  Generalized anxiety disorder - Plan: Vilazodone HCl (VIIBRYD) 20 MG TABS  Insomnia due to mental disorder  Please see After Visit Summary for patient specific instructions.  Future Appointments  Date Time Provider [place]  05/15/2018  1:30 PM [name], PMHNP CP-CP None  05/22/2018  1:00 PM Lina Sayre, [facility] CP-CP None    No orders of the defined types were placed in this encounter.     -------------------------------

## 2018-04-17 ENCOUNTER — Other Ambulatory Visit: Payer: Self-pay | Admitting: Psychiatry

## 2018-04-17 DIAGNOSIS — F4310 Post-traumatic stress disorder, unspecified: Secondary | ICD-10-CM

## 2018-04-17 DIAGNOSIS — F330 Major depressive disorder, recurrent, mild: Secondary | ICD-10-CM

## 2018-04-17 DIAGNOSIS — F411 Generalized anxiety disorder: Secondary | ICD-10-CM

## 2018-04-25 ENCOUNTER — Other Ambulatory Visit: Payer: Self-pay | Admitting: Psychiatry

## 2018-04-25 DIAGNOSIS — F330 Major depressive disorder, recurrent, mild: Secondary | ICD-10-CM

## 2018-04-29 ENCOUNTER — Telehealth: Payer: Self-pay | Admitting: Psychiatry

## 2018-04-29 NOTE — Telephone Encounter (Signed)
Pt on lamotrigine suppose to start new dosage on Thursday. She wants to know if she should take go up by 25mg  or what.

## 2018-04-30 NOTE — Telephone Encounter (Signed)
I called her and left her a message on her personal phone. She told me to leave it on there if she didn't answer. I advised her to call back if there were any side effects.

## 2018-04-30 NOTE — Telephone Encounter (Signed)
On 75 Mg currently. She wants to know if she goes from 50 Mg to 100 Mg or 50 Mg to 75 Mg and then !00 Mg.

## 2018-04-30 NOTE — Telephone Encounter (Signed)
Left pt. A VM.

## 2018-04-30 NOTE — Telephone Encounter (Signed)
She can go to 100 mg from her 50 mg as long as no side effects and feeling ok with dosage.

## 2018-05-15 ENCOUNTER — Ambulatory Visit (INDEPENDENT_AMBULATORY_CARE_PROVIDER_SITE_OTHER): Payer: BLUE CROSS/BLUE SHIELD | Admitting: Psychiatry

## 2018-05-15 ENCOUNTER — Other Ambulatory Visit: Payer: Self-pay

## 2018-05-15 DIAGNOSIS — F411 Generalized anxiety disorder: Secondary | ICD-10-CM

## 2018-05-15 DIAGNOSIS — F330 Major depressive disorder, recurrent, mild: Secondary | ICD-10-CM

## 2018-05-15 DIAGNOSIS — F4310 Post-traumatic stress disorder, unspecified: Secondary | ICD-10-CM

## 2018-05-15 MED ORDER — CLONAZEPAM 1 MG PO TABS: ORAL_TABLET | ORAL | 2 refills | Status: DC

## 2018-05-15 MED ORDER — VILAZODONE HCL 20 MG PO TABS: 30.0000 mg | ORAL_TABLET | Freq: Every day | ORAL | 1 refills | Status: DC

## 2018-05-15 MED ORDER — LAMOTRIGINE 150 MG PO TABS: 150.0000 mg | ORAL_TABLET | Freq: Every day | ORAL | 0 refills | Status: DC

## 2018-05-15 NOTE — Progress Notes (Signed)
Breanna Young [id number] 06-24-1980 38 y.o.  Virtual Visit via Telephone Note  I connected with@ on 05/15/18 at  1:30 PM EDT by telephone and verified that I am speaking with the correct person using two identifiers.   I discussed the limitations, risks, security and privacy concerns of performing an evaluation and management service by telephone and the availability of in person appointments. I also discussed with the patient that there may be a patient responsible charge related to this service. The patient expressed understanding and agreed to proceed.   I discussed the assessment and treatment plan with the patient. The patient was provided an opportunity to ask questions and all were answered. The patient agreed with the plan and demonstrated an understanding of the instructions.   The patient was advised to call back or seek an in-person evaluation if the symptoms worsen or if the condition fails to improve as anticipated.  I provided 30 minutes of non-face-to-face time during this encounter.  The patient was located at home.  The provider was located at home.   Breanna Young, PMHNP   Subjective:   Patient ID:  Breanna Young is a 38 y.o. (DOB 12/23/1980) female.  Chief Complaint:  Chief Complaint  Patient presents with  . Follow-up    anxiety, depression    HPI Breanna Young presents for follow-up of depression and anxiety. "Having more better days than not... not feeling as bad as I have been before... don't feel as depressed."   She reports that she will likely be out of work for at least 2 months due to stay at home order. Reports that boyfriend's work has reduced significantly with the pandemic.  She reports "I've had a few melt downs." She reports that she has had anxiety with going into stores and is trying to quarantine as much as possible. She reports that she awakened this morning with some anxiety s/s.   Reports that she is tolerating Lamictal 100 mg po qd  well. Notices that she has noticed some improvement in anxiety with Lamictal. She reports that she has been having some dull HA's daily that she finds tolerable. She notices it more when dose is increased and then it improves. She reports that her mood has been "quite a bit lighter." She reports that she is not as irritable.  She reports that she has noticed an improvement in side effects with Viibryd from 40 to 30 mg po qd. She reports that about a week later and felt more "aware of things" and no longer feeling as if she in a fog. Reports that she has not had sexual side effects at 30 mg dose.    She reports that her energy and motivation varies. She reports that some days she feels like she wants to clean or get outside in the sun. Reports that at times she will go for a ride if she needs to run an errand. She reports energy and motivation is lower when she is not leaving the house. Has been getting out and walking her dogs. She reports that her sleep has been "better."  She reports that she has been having "odd" dreams but not nightmares. She reports that her appetite has been good. Has been stress eating at times. She reports that concentration has improved. Denies SI.    Review of Systems:  Review of Systems  HENT: Positive for ear pain and sneezing.   Eyes: Positive for itching.  Gastrointestinal:       Reports that she  has been having cycles of constipation and diarrhea.   Musculoskeletal: Negative for gait problem.  Allergic/Immunologic: Positive for environmental allergies.  Neurological: Positive for headaches. Negative for tremors.  Psychiatric/Behavioral:       Please refer to HPI    Medications: I have reviewed the patient's current medications.  Current Outpatient Medications  Medication Sig Dispense Refill  . CALCIUM-MAGNESIUM PO Take by mouth.    . clonazePAM (KLONOPIN) 1 MG tablet TAKE 1 TAB PO BID AND 1 TAB PO QD PRN ANXIETY 75 tablet 2  . Glucosamine-Chondroitin  (GLUCOSAMINE CHONDR COMPLEX PO) Take 3 tablets by mouth.    . Lactobacillus (PROBIOTIC ACIDOPHILUS PO) Take by mouth.    . Multiple Vitamin (MULTIVITAMIN) LIQD Take 5 mLs by mouth daily.    Marland Kitchen omega-3 fish oil (MAXEPA) 1000 MG CAPS capsule Take 2 capsules by mouth daily.    . pantoprazole (PROTONIX) 40 MG tablet Take 40 mg by mouth daily.    . Vilazodone HCl (VIIBRYD) 20 MG TABS Take 1.5 tablets (30 mg total) by mouth daily. 135 tablet 1  . lamoTRIgine (LAMICTAL) 150 MG tablet Take 1 tablet (150 mg total) by mouth daily. 90 tablet 0  . traZODone (DESYREL) 100 MG tablet Take 1 tablet (100 mg total) by mouth at bedtime. 90 tablet 1   No current facility-administered medications for this visit.     Medication Side Effects: Headache  Allergies:  Allergies  Allergen Reactions  . Avelox [Moxifloxacin Hcl In Nacl]   . Dexlansoprazole   . Doxazosin     Past Medical History:  Diagnosis Date  . GERD (gastroesophageal reflux disease)   . IBS (irritable bowel syndrome)     Family History  Problem Relation Age of Onset  . Depression Mother   . Heart attack Father   . Diabetes Father   . Depression Maternal Grandmother   . Endometrial cancer Maternal Grandmother   . Dementia Maternal Grandfather   . Diabetes Maternal Grandfather   . Dementia Paternal Grandmother     Social History   Socioeconomic History  . Marital status: Single    Spouse name: Not on file  . Number of children: Not on file  . Years of education: Not on file  . Highest education level: Not on file  Occupational History  . Occupation: hair stylist  Social Needs  . Financial resource strain: Not on file  . Food insecurity:    Worry: Not on file    Inability: Not on file  . Transportation needs:    Medical: Not on file    Non-medical: Not on file  Tobacco Use  . Smoking status: Never Smoker  . Smokeless tobacco: Never Used  Substance and Sexual Activity  . Alcohol use: Not Currently    Alcohol/week: 2.0  standard drinks    Types: 2 Glasses of wine per week  . Drug use: Never  . Sexual activity: Yes    Partners: Male    Birth control/protection: I.U.D.  Lifestyle  . Physical activity:    Days per week: 4 days    Minutes per session: 60 min  . Stress: Not on file  Relationships  . Social connections:    Talks on phone: Not on file    Gets together: Not on file    Attends religious service: Not on file    Active member of club or organization: Not on file    Attends meetings of clubs or organizations: Not on file    Relationship status:  Not on file  . Intimate partner violence:    Fear of current or ex partner: Not on file    Emotionally abused: Not on file    Physically abused: Not on file    Forced sexual activity: Not on file  Other Topics Concern  . Not on file  Social History Narrative  . Not on file    Past Medical History, Surgical history, Social history, and Family history were reviewed and updated as appropriate.   Please see review of systems for further details on the patient's review from today.   Objective:   Physical Exam:  There were no vitals taken for this visit.  Physical Exam  Lab Review:  No results found for: NA, K, CL, CO2, GLUCOSE, BUN, CREATININE, CALCIUM, PROT, ALBUMIN, AST, ALT, ALKPHOS, BILITOT, GFRNONAA, GFRAA  No results found for: WBC, RBC, HGB, HCT, PLT, MCV, MCH, MCHC, RDW, LYMPHSABS, MONOABS, EOSABS, BASOSABS  No results found for: POCLITH, LITHIUM   No results found for: PHENYTOIN, PHENOBARB, VALPROATE, CBMZ   .res Assessment: Plan:   Will increase Lamictal to 150 mg daily since patient reports that she has noticed some improvement with mood and anxiety signs and symptoms with Lamictal without any significant tolerability issues. Continue Viibryd 30 mg daily for depression and anxiety. Continue Klonopin 1 mg twice daily and 1 tab p.o. daily as needed anxiety. Continue trazodone 100 mg p.o. nightly as needed insomnia.  Recommend  continuing psychotherapy with Lina Sayre, [place].  Patient to follow-up in 6 weeks or sooner if clinically indicated.   Major depressive disorder, recurrent episode, mild (HCC) - Plan: lamoTRIgine (LAMICTAL) 150 MG tablet, Vilazodone HCl (VIIBRYD) 20 MG TABS  PTSD (post-traumatic stress disorder) - Plan: Vilazodone HCl (VIIBRYD) 20 MG TABS, clonazePAM (KLONOPIN) 1 MG tablet  Generalized anxiety disorder - Plan: Vilazodone HCl (VIIBRYD) 20 MG TABS, clonazePAM (KLONOPIN) 1 MG tablet  Please see After Visit Summary for patient specific instructions.  Future Appointments  Date Time Provider [place]  05/22/2018  1:00 PM Lina Sayre, [facility] CP-CP None  06/19/2018 11:45 AM Breanna Young, PMHNP CP-CP None    No orders of the defined types were placed in this encounter.     -------------------------------

## 2018-05-16 ENCOUNTER — Encounter: Payer: Self-pay | Admitting: Psychiatry

## 2018-05-22 ENCOUNTER — Ambulatory Visit (INDEPENDENT_AMBULATORY_CARE_PROVIDER_SITE_OTHER): Payer: BLUE CROSS/BLUE SHIELD | Admitting: Psychiatry

## 2018-05-22 ENCOUNTER — Other Ambulatory Visit: Payer: Self-pay

## 2018-05-22 DIAGNOSIS — F411 Generalized anxiety disorder: Secondary | ICD-10-CM

## 2018-05-22 DIAGNOSIS — F4310 Post-traumatic stress disorder, unspecified: Secondary | ICD-10-CM
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
## 2018-05-23 ENCOUNTER — Other Ambulatory Visit: Payer: Self-pay | Admitting: Psychiatry

## 2018-05-23 DIAGNOSIS — F330 Major depressive disorder, recurrent, mild: Secondary | ICD-10-CM

## 2018-05-24 ENCOUNTER — Encounter: Payer: Self-pay | Admitting: Psychiatry

## 2018-06-19 ENCOUNTER — Encounter: Payer: Self-pay | Admitting: Psychiatry

## 2018-06-19 ENCOUNTER — Ambulatory Visit (INDEPENDENT_AMBULATORY_CARE_PROVIDER_SITE_OTHER): Payer: BC Managed Care – PPO | Admitting: Psychiatry

## 2018-06-19 ENCOUNTER — Other Ambulatory Visit: Payer: Self-pay

## 2018-06-19 DIAGNOSIS — F5105 Insomnia due to other mental disorder: Secondary | ICD-10-CM | POA: Diagnosis not present

## 2018-06-19 DIAGNOSIS — F3341 Major depressive disorder, recurrent, in partial remission: Secondary | ICD-10-CM

## 2018-06-19 DIAGNOSIS — F4310 Post-traumatic stress disorder, unspecified: Secondary | ICD-10-CM | POA: Diagnosis not present

## 2018-06-19 DIAGNOSIS — F411 Generalized anxiety disorder: Secondary | ICD-10-CM | POA: Diagnosis not present

## 2018-06-19 DIAGNOSIS — F330 Major depressive disorder, recurrent, mild: Secondary | ICD-10-CM | POA: Diagnosis not present

## 2018-06-19 MED ORDER — VILAZODONE HCL 20 MG PO TABS: 30.0000 mg | ORAL_TABLET | Freq: Every day | ORAL | 1 refills | Status: DC

## 2018-06-19 MED ORDER — LAMOTRIGINE 150 MG PO TABS: 150.0000 mg | ORAL_TABLET | Freq: Every day | ORAL | 0 refills | Status: DC

## 2018-06-19 MED ORDER — TRAZODONE HCL 100 MG PO TABS: 100.0000 mg | ORAL_TABLET | Freq: Every day | ORAL | 1 refills | Status: DC

## 2018-06-19 NOTE — Progress Notes (Signed)
Breanna Young [id number] 1980-10-30 38 y.o.  Virtual Visit via Telephone Note  I connected with pt on 06/19/18 at 11:45 AM EDT by telephone and verified that I am speaking with the correct person using two identifiers.   I discussed the limitations, risks, security and privacy concerns of performing an evaluation and management service by telephone and the availability of in person appointments. I also discussed with the patient that there may be a patient responsible charge related to this service. The patient expressed understanding and agreed to proceed.   I discussed the assessment and treatment plan with the patient. The patient was provided an opportunity to ask questions and all were answered. The patient agreed with the plan and demonstrated an understanding of the instructions.   The patient was advised to call back or seek an in-person evaluation if the symptoms worsen or if the condition fails to improve as anticipated.  I provided 30 minutes of non-face-to-face time during this encounter.  The patient was located at home.  The provider was located at [place].   [name], PMHNP   Subjective:   Patient ID:  Breanna Young is a 38 y.o. (DOB September 06, 1980) female.  Chief Complaint:  Chief Complaint  Patient presents with  . Follow-up    h/o Anxiety, Depression, and insomnia    HPI Breanna Young presents for follow-up of depression and anxiety. She reports that increase in Lamictal to 150 mg po qd has been helpful for her mood and anxiety.   She reports that she has returned to work and reports that this has been stressful. 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 She reports that she had increased anxiety with going back to work initially and had some nausea. She reports that she has had difficulty adjusting to wearing a face mask and this triggers her claustrophobia. Reports that she had one  panic attack with returning to work.  Denies depressed mood. Reports some slight irritability with increased anxiety. She reports that she has been sleeping adequately. Reports that she has had recent vivid dreams. Denies nightmares. She reports appetite has returned to baseline. She reports that her energy is ok and "could be better." She reports adequate concentration. She feels that concentration is better than it was previously. Denies SI.   Review of Systems:  Review of Systems  Gastrointestinal: Positive for constipation.  Musculoskeletal: Negative for gait problem.  Skin: Negative for rash.       Reports that her skin has been "bumpy" in places. Denies itching.  Allergic/Immunologic: Positive for environmental allergies.  Neurological: Negative for tremors.  Psychiatric/Behavioral:       Please refer to HPI   She reports that diarrhea has now switched to constipation and can go several days without a BM.   Medications: I have reviewed the patient's current medications.  Current Outpatient Medications  Medication Sig Dispense Refill  . CALCIUM-MAGNESIUM PO Take by mouth.    . clonazePAM (KLONOPIN) 1 MG tablet TAKE 1 TAB PO BID AND 1 TAB PO QD PRN ANXIETY 75 tablet 2  . Glucosamine-Chondroitin (GLUCOSAMINE CHONDR COMPLEX PO) Take 3 tablets by mouth.    . Lactobacillus (PROBIOTIC ACIDOPHILUS PO) Take by mouth.    . lamoTRIgine (LAMICTAL) 150 MG tablet Take 1 tablet (150 mg total) by mouth daily. 90 tablet 0  . Multiple Vitamin (MULTIVITAMIN) LIQD Take 5 mLs by mouth daily.    Marland Kitchen omega-3 fish oil (MAXEPA) 1000  MG CAPS capsule Take 2 capsules by mouth daily.    . pantoprazole (PROTONIX) 40 MG tablet Take 40 mg by mouth daily.    . traZODone (DESYREL) 100 MG tablet Take 1 tablet (100 mg total) by mouth at bedtime. 90 tablet 1  . Vilazodone HCl (VIIBRYD) 20 MG TABS Take 1.5 tablets (30 mg total) by mouth daily. 135 tablet 1   No current facility-administered medications for this  visit.     Medication Side Effects: None  Allergies:  Allergies  Allergen Reactions  . Avelox [Moxifloxacin Hcl In Nacl]   . Dexlansoprazole   . Doxazosin     Past Medical History:  Diagnosis Date  . GERD (gastroesophageal reflux disease)   . IBS (irritable bowel syndrome)     Family History  Problem Relation Age of Onset  . Depression Mother   . Heart attack Father   . Diabetes Father   . Depression Maternal Grandmother   . Endometrial cancer Maternal Grandmother   . Dementia Maternal Grandfather   . Diabetes Maternal Grandfather   . Dementia Paternal Grandmother     Social History   Socioeconomic History  . Marital status: Single    Spouse name: Not on file  . Number of children: Not on file  . Years of education: Not on file  . Highest education level: Not on file  Occupational History  . Occupation: hair stylist  Social Needs  . Financial resource strain: Not on file  . Food insecurity:    Worry: Not on file    Inability: Not on file  . Transportation needs:    Medical: Not on file    Non-medical: Not on file  Tobacco Use  . Smoking status: Never Smoker  . Smokeless tobacco: Never Used  Substance and Sexual Activity  . Alcohol use: Not Currently    Alcohol/week: 2.0 standard drinks    Types: 2 Glasses of wine per week  . Drug use: Never  . Sexual activity: Yes    Partners: Male    Birth control/protection: I.U.D.  Lifestyle  . Physical activity:    Days per week: 4 days    Minutes per session: 60 min  . Stress: Not on file  Relationships  . Social connections:    Talks on phone: Not on file    Gets together: Not on file    Attends religious service: Not on file    Active member of club or organization: Not on file    Attends meetings of clubs or organizations: Not on file    Relationship status: Not on file  . Intimate partner violence:    Fear of current or ex partner: Not on file    Emotionally abused: Not on file    Physically abused:  Not on file    Forced sexual activity: Not on file  Other Topics Concern  . Not on file  Social History Narrative  . Not on file    Past Medical History, Surgical history, Social history, and Family history were reviewed and updated as appropriate.   Please see review of systems for further details on the patient's review from today.   Objective:   Physical Exam:  There were no vitals taken for this visit.  Physical Exam Neurological:     Mental Status: She is alert and oriented to person, place, and time.     Cranial Nerves: No dysarthria.  Psychiatric:        Attention and Perception: Attention normal.  Mood and Affect: Mood normal.        Speech: Speech normal.        Behavior: Behavior is cooperative.        Thought Content: Thought content normal. Thought content is not paranoid or delusional. Thought content does not include homicidal or suicidal ideation. Thought content does not include homicidal or suicidal plan.        Cognition and Memory: Cognition and memory normal.        Judgment: Judgment normal.     Comments: Mood is appropriate to content.  Insight intact.     Lab Review:  No results found for: NA, K, CL, CO2, GLUCOSE, BUN, CREATININE, CALCIUM, PROT, ALBUMIN, AST, ALT, ALKPHOS, BILITOT, GFRNONAA, GFRAA  No results found for: WBC, RBC, HGB, HCT, PLT, MCV, MCH, MCHC, RDW, LYMPHSABS, MONOABS, EOSABS, BASOSABS  No results found for: POCLITH, LITHIUM   No results found for: PHENYTOIN, PHENOBARB, VALPROATE, CBMZ   .res Assessment: Plan:   Will continue current medications since patient reports that medications seem to be adequately controlling her signs and symptoms, particularly considering situational stressors. Continue Viibryd 30 mg daily for depression and anxiety Continue Lamictal 150 mg daily for mood. Continue trazodone 100 mg at bedtime for insomnia. Continue Klonopin for anxiety. Recommend continuing psychotherapy with Lina Sayre,  [place]. Patient to follow-up in 2 months or sooner if clinically indicated. PTSD (post-traumatic stress disorder) - Plan: Vilazodone HCl (VIIBRYD) 20 MG TABS  Generalized anxiety disorder - Plan: Vilazodone HCl (VIIBRYD) 20 MG TABS  Insomnia due to mental disorder - Plan: traZODone (DESYREL) 100 MG tablet  Recurrent major depressive disorder, in partial remission (HCC)  Insomnia due to mental disorder - Chronic with some recent intermittent awakenings due to anxiety - Plan: traZODone (DESYREL) 100 MG tablet  Major depressive disorder, recurrent episode, mild (HCC) - Plan: Vilazodone HCl (VIIBRYD) 20 MG TABS, lamoTRIgine (LAMICTAL) 150 MG tablet  Please see After Visit Summary for patient specific instructions.  Future Appointments  Date Time Provider [place]  06/19/2018  1:00 PM Lina Sayre, [facility] CP-CP None    No orders of the defined types were placed in this encounter.     -------------------------------

## 2018-06-19 NOTE — Progress Notes (Signed)
[facility] Counselor/Therapist Progress Note  Patient ID: Breanna Young, MRN: [id number],    Date: 06/19/2018  Time Spent: 52 minutes start time 1:00 PM End time 1:52 PM Virtual Visit via Telephone Note Connected with patient by a video enabled telemedicine/telehealth application or telephone, with their informed consent, and verified patient privacy and that I am speaking with the correct person using two identifiers. I discussed the limitations, risks, security and privacy concerns of performing psychotherapy and management service by telephone and the availability of in person appointments. I also discussed with the patient that there may be a patient responsible charge related to this service. The patient expressed understanding and agreed to proceed. I discussed the treatment planning with the patient. The patient was provided an opportunity to ask questions and all were answered. The patient agreed with the plan and demonstrated an understanding of the instructions. The patient was advised to call  our office if  symptoms worsen or feel they are in a crisis state and need immediate contact.   Therapist Location: office Patient Location: home    Treatment Type: Individual Therapy  Reported Symptoms: anxiety,irritable, triggered responses, depression, flashbacks  Mental Status Exam:  Appearance:   NA     Behavior:  Sharing  Motor:  NA  Speech/Language:   Normal Rate  Affect:  NA  Mood:  anxious  Thought process:  normal  Thought content:    WNL  Sensory/Perceptual disturbances:    WNL  Orientation:  oriented to person, place, time/date and situation  Attention:  Good  Concentration:  Good  Memory:  WNL  Fund of knowledge:   Good  Insight:    Good  Judgment:   Good  Impulse Control:  Good   Risk Assessment: Danger to Self:  No Self-injurious Behavior: No Danger to Others: No Duty to Warn:no Physical Aggression / Violence:No  Access to Firearms a concern: No   Gang Involvement:No   Subjective: Met with patient via phone.  Patient reported that things had gotten much worse after last session.  She explained that she is starting to have more contact with her mother and during those times she is getting very triggered by comments that her mother is making.  Patient explained her mother is pushing her to discuss more of the traumas that she is experienced.  She shared that the issue is if she divulges anything than she uses it against her at later times.  Patient shared that that is already occurred with the things that she has shared with her mother previously.  Patient was very tearful discussed the hurt that she feels when she goes around her mom.  Patient reported even on the telephone her mother will let things go well for a while and then start asking her strange questions that bring up lots of emotion for patient.  Patient was encouraged to try and put in place some CBT filters so that she can keep herself at a positive place when she interacts with her mother.  The importance of her reminding herself that her mother is having difficulty hearing and excepting what occurred due to she being the adult who was to keep her safe.  Also, there seems to be a continued dynamic between she and her mother and without communicating with her is hard to know exactly what that issue was there.  Patient was able to acknowledge the fact that that has always been present in their relationship she was hopeful that things would  change but now she is saying she they will not.  The importance of affirming the younger parts of her and working on her affirmations to help her get to the other side of it were discussed with patient.  Also different plans to allow herself to be removed in situations when she gets triggered were discussed with patient.  Patient explained her birthday and her boyfriend's birthday are coming up so she knows they will have to do a family dinner and that would  be a time her mother would try and make comments if she has a moment with patient alone.  Different strategies to assure that does not happen were discussed with patient and plans were developed in session.  Interventions: Cognitive Behavioral Therapy and Solution-Oriented/Positive Psychology  Diagnosis:   ICD-10-CM   1. PTSD (post-traumatic stress disorder) F43.10   2. Generalized anxiety disorder F41.1   3. Major depressive disorder, recurrent episode, in partial remission ([place]) F33.41     Plan: 1.  Patient to continue to engage in individual counseling 2-4 times a month or as needed. 2.  Patient to identify and apply CBT, coping skills learned in session to decrease depression and anxiety symptoms. 3.  Patient to contact this office, go to the local ED or call 911 if a crisis or emergency develops between visits.  Lina Sayre, [facility]   This record has been created using [employer].  Chart creation errors have been sought, but may not always have been located and corrected. Such creation errors do not reflect on the standard of medical care.

## 2018-06-21 ENCOUNTER — Encounter: Payer: Self-pay | Admitting: Psychiatry

## 2018-07-12 DIAGNOSIS — G44011 Episodic cluster headache, intractable: Secondary | ICD-10-CM | POA: Diagnosis not present

## 2018-07-12 DIAGNOSIS — H6993 Unspecified Eustachian tube disorder, bilateral: Secondary | ICD-10-CM | POA: Diagnosis not present

## 2018-07-17 ENCOUNTER — Other Ambulatory Visit: Payer: Self-pay

## 2018-07-17 ENCOUNTER — Ambulatory Visit (INDEPENDENT_AMBULATORY_CARE_PROVIDER_SITE_OTHER): Payer: BC Managed Care – PPO | Admitting: Psychiatry

## 2018-07-17 ENCOUNTER — Encounter: Payer: Self-pay | Admitting: Psychiatry

## 2018-07-17 DIAGNOSIS — F4310 Post-traumatic stress disorder, unspecified: Secondary | ICD-10-CM

## 2018-07-17 DIAGNOSIS — F411 Generalized anxiety disorder: Secondary | ICD-10-CM | POA: Diagnosis not present

## 2018-07-17 NOTE — Progress Notes (Signed)
      [facility] Counselor/Therapist Progress Note  Patient ID: Breanna Young, MRN: [id number],    Date: 07/17/2018  Time Spent: 48 minutes  Treatment Type: Individual Therapy  Reported Symptoms: anxiety, triggered responses, crying spell  Mental Status Exam:  Appearance:   Well Groomed     Behavior:  Sharing  Motor:  Normal  Speech/Language:   Normal Rate  Affect:  Congruent  Mood:  normal  Thought process:  normal  Thought content:    WNL  Sensory/Perceptual disturbances:    WNL  Orientation:  oriented to person, place, time/date and situation  Attention:  Good  Concentration:  Good  Memory:  WNL  Fund of knowledge:   Good  Insight:    Good  Judgment:   Good  Impulse Control:  Good   Risk Assessment: Danger to Self:  No Self-injurious Behavior: No Danger to Others: No Duty to Warn:no Physical Aggression / Violence:No  Access to Firearms a concern: No  Gang Involvement:No   Subjective: Patient was present for session.  Patient reported that overall things were going okay for her.  She was thankful to be back at work and making money and that is helped taken some stress off of her.  She went on to explain that things continue to seem to be getting a little better with her mother which is a positive surprise.  Ways for her to continue working had to interact differently with her mother were discussed in session and plans were developed.  Patient reported that her boyfriend however is having lots of difficulties due to stress with work, stress with not being able to have contact with his son, and issues within his family.  Patient recognized that he is not using appropriate coping skills.  Patient was able to report she has been working on setting limits with him and that she is noticed that is something new for her that she does deserve to be treated with respect and she is going to continue expecting that from him.  Patient also reported that she is realized when he gets in  a bad place she needs to go ahead and work out and take care of herself rather than trying to figure out how to help him when he is not a place to be helped.  Ways for patient to continue working on CBT skills to help her do that were discussed in session.  The importance of reminding herself that she has value more that was also addressed with patient and she agreed to continue working on her affirmations.  Interventions: Cognitive Behavioral Therapy and Solution-Oriented/Positive Psychology  Diagnosis:   ICD-10-CM   1. PTSD (post-traumatic stress disorder)  F43.10   2. Generalized anxiety disorder  F41.1     Plan: 1.  Patient to continue to engage in individual counseling 1-2 times a month or as needed. 2.  Patient to identify and apply CBT, coping skills learned in session to decrease triggered responses and anxiety symptoms. 3.  Patient to contact this office, go to the local ED or call 911 if a crisis or emergency develops between visits.  Lina Sayre, [facility]  This record has been created using [employer].  Chart creation errors have been sought, but may not always have been located and corrected. Such creation errors do not reflect on the standard of medical care.

## 2018-07-24 DIAGNOSIS — Z30431 Encounter for routine checking of intrauterine contraceptive device: Secondary | ICD-10-CM | POA: Diagnosis not present

## 2018-07-24 DIAGNOSIS — Z8049 Family history of malignant neoplasm of other genital organs: Secondary | ICD-10-CM | POA: Diagnosis not present

## 2018-07-24 DIAGNOSIS — Z87410 Personal history of cervical dysplasia: Secondary | ICD-10-CM | POA: Diagnosis not present

## 2018-07-24 DIAGNOSIS — Z01419 Encounter for gynecological examination (general) (routine) without abnormal findings: Secondary | ICD-10-CM | POA: Diagnosis not present

## 2018-08-14 ENCOUNTER — Ambulatory Visit (INDEPENDENT_AMBULATORY_CARE_PROVIDER_SITE_OTHER): Payer: BC Managed Care – PPO | Admitting: Psychiatry

## 2018-08-14 ENCOUNTER — Encounter: Payer: Self-pay | Admitting: Psychiatry

## 2018-08-14 ENCOUNTER — Other Ambulatory Visit: Payer: Self-pay

## 2018-08-14 VITALS — Wt 143.0 lb

## 2018-08-14 DIAGNOSIS — F411 Generalized anxiety disorder: Secondary | ICD-10-CM

## 2018-08-14 DIAGNOSIS — F3342 Major depressive disorder, recurrent, in full remission: Secondary | ICD-10-CM | POA: Diagnosis not present

## 2018-08-14 DIAGNOSIS — F4310 Post-traumatic stress disorder, unspecified: Secondary | ICD-10-CM | POA: Diagnosis not present

## 2018-08-14 DIAGNOSIS — F5105 Insomnia due to other mental disorder: Secondary | ICD-10-CM

## 2018-08-14 MED ORDER — LAMOTRIGINE 150 MG PO TABS: 150.0000 mg | ORAL_TABLET | Freq: Every day | ORAL | 1 refills | Status: DC

## 2018-08-14 NOTE — Progress Notes (Signed)
Breanna Young [id number] 12-02-1980 38 y.o.  Subjective:   Patient ID:  Breanna Young is a 38 y.o. (DOB 03-31-1980) female.  Chief Complaint:  Chief Complaint  Patient presents with  . Follow-up    h/o Anxiety and Depression    HPI Breanna Young presents to the office today for follow-up of anxiety, depression, and insomnia. Reports some recent muscle tension. She reports that anxiety has been "overall pretty good." Reports occ episodes of anxiety and has been able to manage this. Denies panic attacks. She reports that her mood has been "a lot better." Reports that Lamotrigine 150 mg po qd has been helpful for her mood. Denies significant depression aside from brief situational sadness in response to current events. She reports improved sleep. Sleeping 7-8 hours a night typically. She reports that her appetite has been good and has been trying to eat small meals throughout the day. She reports that her energy and motivation have been good. She reports that her concentration has been ok overall. Denies SI.   Review of Systems:  Review of Systems  Musculoskeletal: Positive for neck pain and neck stiffness. Negative for gait problem.  Neurological: Positive for headaches. Negative for tremors.  Psychiatric/Behavioral:       Please refer to HPI  Reports headaches began about a month ago.   Medications: I have reviewed the patient's current medications.  Current Outpatient Medications  Medication Sig Dispense Refill  . CALCIUM-MAGNESIUM PO Take by mouth.    . clonazePAM (KLONOPIN) 1 MG tablet TAKE 1 TAB PO BID AND 1 TAB PO QD PRN ANXIETY 75 tablet 2  . fluticasone (FLONASE) 50 MCG/ACT nasal spray Place into both nostrils daily.    . Glucosamine-Chondroitin (GLUCOSAMINE CHONDR COMPLEX PO) Take 3 tablets by mouth.    . Lactobacillus (PROBIOTIC ACIDOPHILUS PO) Take by mouth.    . lamoTRIgine (LAMICTAL) 150 MG tablet Take 1 tablet (150 mg total) by mouth daily. 90 tablet 1  . Multiple  Vitamin (MULTIVITAMIN) LIQD Take 5 mLs by mouth daily.    Marland Kitchen omega-3 fish oil (MAXEPA) 1000 MG CAPS capsule Take 2 capsules by mouth daily.    . pantoprazole (PROTONIX) 40 MG tablet Take 40 mg by mouth daily.    . psyllium (METAMUCIL) 58.6 % packet Take 1 packet by mouth daily.    Marland Kitchen tiZANidine (ZANAFLEX) 4 MG capsule Take 4 mg by mouth daily as needed for muscle spasms.    . traZODone (DESYREL) 100 MG tablet Take 1 tablet (100 mg total) by mouth at bedtime. 90 tablet 1  . Vilazodone HCl (VIIBRYD) 20 MG TABS Take 1.5 tablets (30 mg total) by mouth daily. 135 tablet 1   No current facility-administered medications for this visit.     Medication Side Effects: None  Possible mild constipation.  Allergies:  Allergies  Allergen Reactions  . Avelox [Moxifloxacin Hcl In Nacl]   . Dexlansoprazole   . Doxazosin     Past Medical History:  Diagnosis Date  . GERD (gastroesophageal reflux disease)   . IBS (irritable bowel syndrome)     Family History  Problem Relation Age of Onset  . Depression Mother   . Heart attack Father   . Diabetes Father   . Depression Maternal Grandmother   . Endometrial cancer Maternal Grandmother   . Dementia Maternal Grandfather   . Diabetes Maternal Grandfather   . Dementia Paternal Grandmother     Social History   Socioeconomic History  . Marital status: Single    Spouse  name: Not on file  . Number of children: Not on file  . Years of education: Not on file  . Highest education level: Not on file  Occupational History  . Occupation: hair stylist  Social Needs  . Financial resource strain: Not on file  . Food insecurity    Worry: Not on file    Inability: Not on file  . Transportation needs    Medical: Not on file    Non-medical: Not on file  Tobacco Use  . Smoking status: Never Smoker  . Smokeless tobacco: Never Used  Substance and Sexual Activity  . Alcohol use: Not Currently    Alcohol/week: 2.0 standard drinks    Types: 2 Glasses of  wine per week  . Drug use: Never  . Sexual activity: Yes    Partners: Male    Birth control/protection: I.U.D.  Lifestyle  . Physical activity    Days per week: 4 days    Minutes per session: 60 min  . Stress: Not on file  Relationships  . Social Herbalist on phone: Not on file    Gets together: Not on file    Attends religious service: Not on file    Active member of club or organization: Not on file    Attends meetings of clubs or organizations: Not on file    Relationship status: Not on file  . Intimate partner violence    Fear of current or ex partner: Not on file    Emotionally abused: Not on file    Physically abused: Not on file    Forced sexual activity: Not on file  Other Topics Concern  . Not on file  Social History Narrative  . Not on file    Past Medical History, Surgical history, Social history, and Family history were reviewed and updated as appropriate.   Please see review of systems for further details on the patient's review from today.   Objective:   Physical Exam:  Wt 143 lb (64.9 kg)   BMI 23.80 kg/m   Physical Exam Constitutional:      General: She is not in acute distress.    Appearance: She is well-developed.  Musculoskeletal:        General: No deformity.  Neurological:     Mental Status: She is alert and oriented to person, place, and time.     Coordination: Coordination normal.  Psychiatric:        Attention and Perception: Attention and perception normal. She does not perceive auditory or visual hallucinations.        Mood and Affect: Mood normal. Mood is not anxious or depressed. Affect is not labile, blunt, angry or inappropriate.        Speech: Speech normal.        Behavior: Behavior normal.        Thought Content: Thought content normal. Thought content does not include homicidal or suicidal ideation. Thought content does not include homicidal or suicidal plan.        Cognition and Memory: Cognition and memory normal.         Judgment: Judgment normal.     Comments: Insight intact. No delusions.      Lab Review:  No results found for: NA, K, CL, CO2, GLUCOSE, BUN, CREATININE, CALCIUM, PROT, ALBUMIN, AST, ALT, ALKPHOS, BILITOT, GFRNONAA, GFRAA  No results found for: WBC, RBC, HGB, HCT, PLT, MCV, MCH, MCHC, RDW, LYMPHSABS, MONOABS, EOSABS, BASOSABS  No results found for: POCLITH,  LITHIUM   No results found for: PHENYTOIN, PHENOBARB, VALPROATE, CBMZ   .res Assessment: Plan:   Will continue current plan of care since patient reports that mood, anxiety, and insomnia are currently well controlled with medications and denies any tolerability issues.  Patient request that medications not be sent to pharmacy at this time other than lamotrigine and instead waiting for pharmacy to request other refills when needed. Continue Viibryd 30 mg daily for anxiety and depression. Continue trazodone 100 mg at bedtime for insomnia. Continue Klonopin for anxiety. Recommend continuing psychotherapy with Lina Sayre, [place]. Patient to follow-up with this provider in 3 months or sooner if clinically indicated. Patient advised to contact office with any questions, adverse effects, or acute worsening in signs and symptoms.  Breanna was seen today for follow-up.  Diagnoses and all orders for this visit:  Generalized anxiety disorder  Recurrent major depressive disorder, in full remission ([place]) -     lamoTRIgine (LAMICTAL) 150 MG tablet; Take 1 tablet (150 mg total) by mouth daily.  PTSD (post-traumatic stress disorder)  Insomnia due to mental disorder     Please see After Visit Summary for patient specific instructions.  Future Appointments  Date Time Provider [place]  08/21/2018  1:00 PM Lina Sayre, [facility] None  11/13/2018  1:15 PM [name], [facility] None    No orders of the defined types were placed in this encounter.   -------------------------------

## 2018-08-21 ENCOUNTER — Ambulatory Visit (INDEPENDENT_AMBULATORY_CARE_PROVIDER_SITE_OTHER): Payer: BC Managed Care – PPO | Admitting: Psychiatry

## 2018-08-21 ENCOUNTER — Other Ambulatory Visit: Payer: Self-pay

## 2018-08-21 DIAGNOSIS — F4310 Post-traumatic stress disorder, unspecified: Secondary | ICD-10-CM | POA: Diagnosis not present

## 2018-08-21 DIAGNOSIS — F3341 Major depressive disorder, recurrent, in partial remission: Secondary | ICD-10-CM | POA: Diagnosis not present

## 2018-08-21 NOTE — Progress Notes (Signed)
      [facility] Counselor/Therapist Progress Note  Patient ID: Breanna Young, MRN: [id number],    Date: 08/21/2018  Time Spent: 50 minutes  Treatment Type: Individual Therapy  Reported Symptoms: anxiety  Mental Status Exam:  Appearance:   Well Groomed     Behavior:  Sharing  Motor:  Normal  Speech/Language:   Normal Rate  Affect:  Appropriate  Mood:  normal  Thought process:  normal  Thought content:    WNL  Sensory/Perceptual disturbances:    WNL  Orientation:  oriented to person, place, time/date and situation  Attention:  Good  Concentration:  Good  Memory:  WNL  Fund of knowledge:   Good  Insight:    Good  Judgment:   Good  Impulse Control:  Good   Risk Assessment: Danger to Self:  No Self-injurious Behavior: No Danger to Others: No Duty to Warn:no Physical Aggression / Violence:No  Access to Firearms a concern: No  Gang Involvement:No   Subjective: Patient was present for session.  Patient reported that overall she has been doing very well.  She shared she is working hard to stay distant from her mother and use her CBT filters when she does interact with her so she does not get agitated or upset.  She explained that she also contacts her father when things start to get very overwhelming and triggering for her and he is able to remind her of the truth and help her to feel calm again.  She reported she is very sad because due to the situation she is not getting get to go to see her father and she has not seen him for 3 years.  She went on to explain she understands and is going to try and work out another time but they can get together.  Patient went on to share that there are some conflicts in her work situation that were concerning to her.  Discussed the different situations and ways that she can manage them as well as the emotions she is feeling appropriately.  Patient shared she is having some very difficult neck pain and shoulder pain she reported feeling that it  was more occupational but has had other people share could have to do with stress.  Patient was encouraged to research yoga poses that help to safely stretch her neck and back appropriately and try and utilize those skills to see if they help.  She was also encouraged to make sure she is doing her diaphragmatic breathing and reminding herself to relax whenever she notices any tension in her body.  Interventions: Cognitive Behavioral Therapy and Solution-Oriented/Positive Psychology  Diagnosis:   ICD-10-CM   1. Posttraumatic stress disorder  F43.10   2. Major depressive disorder, recurrent episode, in partial remission ([place])  F33.41     Plan: 1.  Patient to continue to engage in individual counseling 2-4 times a month or as needed. 2.  Patient to identify and apply CBT, coping skills learned in session to decrease depression and anxiety symptoms. 3.  Patient to contact this office, go to the local ED or call 911 if a crisis or emergency develops between visits.  Lina Sayre, [facility]   This record has been created using [employer].  Chart creation errors have been sought, but may not always have been located and corrected. Such creation errors do not reflect on the standard of medical care.

## 2018-08-22 ENCOUNTER — Encounter: Payer: Self-pay | Admitting: Psychiatry

## 2018-09-18 ENCOUNTER — Ambulatory Visit: Payer: BC Managed Care – PPO | Admitting: Psychiatry

## 2018-10-16 ENCOUNTER — Other Ambulatory Visit: Payer: Self-pay

## 2018-10-16 ENCOUNTER — Ambulatory Visit (INDEPENDENT_AMBULATORY_CARE_PROVIDER_SITE_OTHER): Payer: BC Managed Care – PPO | Admitting: Psychiatry

## 2018-10-16 ENCOUNTER — Encounter: Payer: Self-pay | Admitting: Psychiatry

## 2018-10-16 DIAGNOSIS — F4310 Post-traumatic stress disorder, unspecified: Secondary | ICD-10-CM | POA: Diagnosis not present
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
## 2018-10-23 ENCOUNTER — Other Ambulatory Visit: Payer: Self-pay

## 2018-10-23 ENCOUNTER — Encounter: Payer: Self-pay | Admitting: Psychiatry

## 2018-10-23 ENCOUNTER — Ambulatory Visit (INDEPENDENT_AMBULATORY_CARE_PROVIDER_SITE_OTHER): Payer: BC Managed Care – PPO | Admitting: Psychiatry

## 2018-10-23 DIAGNOSIS — F4310 Post-traumatic stress disorder, unspecified: Secondary | ICD-10-CM | POA: Diagnosis not present
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
## 2018-11-06 ENCOUNTER — Ambulatory Visit: Payer: BC Managed Care – PPO | Admitting: Psychiatry

## 2018-11-08 ENCOUNTER — Other Ambulatory Visit: Payer: Self-pay | Admitting: Psychiatry

## 2018-11-08 DIAGNOSIS — F411 Generalized anxiety disorder: Secondary | ICD-10-CM

## 2018-11-08 DIAGNOSIS — F4310 Post-traumatic stress disorder, unspecified: Secondary | ICD-10-CM

## 2018-11-13 ENCOUNTER — Ambulatory Visit (INDEPENDENT_AMBULATORY_CARE_PROVIDER_SITE_OTHER): Payer: BC Managed Care – PPO | Admitting: Psychiatry

## 2018-11-13 ENCOUNTER — Encounter: Payer: Self-pay | Admitting: Psychiatry

## 2018-11-13 ENCOUNTER — Other Ambulatory Visit: Payer: Self-pay

## 2018-11-13 VITALS — BP 110/77 | HR 85 | Wt 137.0 lb

## 2018-11-13 DIAGNOSIS — G43911 Migraine, unspecified, intractable, with status migrainosus: Secondary | ICD-10-CM

## 2018-11-13 DIAGNOSIS — F5105 Insomnia due to other mental disorder: Secondary | ICD-10-CM

## 2018-11-13 DIAGNOSIS — F3342 Major depressive disorder, recurrent, in full remission: Secondary | ICD-10-CM

## 2018-11-13 DIAGNOSIS — F4310 Post-traumatic stress disorder, unspecified: Secondary | ICD-10-CM | POA: Diagnosis not present

## 2018-11-13 DIAGNOSIS — F411 Generalized anxiety disorder: Secondary | ICD-10-CM

## 2018-11-13 MED ORDER — VIIBRYD 20 MG PO TABS: 30.0000 mg | ORAL_TABLET | Freq: Every day | ORAL | 1 refills | Status: DC

## 2018-11-13 MED ORDER — TRAZODONE HCL 100 MG PO TABS: 100.0000 mg | ORAL_TABLET | Freq: Every day | ORAL | 1 refills | Status: DC

## 2018-11-13 MED ORDER — UBRELVY 50 MG PO TABS: 50.0000 mg | ORAL_TABLET | Freq: Every day | ORAL | 0 refills | Status: DC | PRN

## 2018-11-13 MED ORDER — LAMOTRIGINE 150 MG PO TABS: 150.0000 mg | ORAL_TABLET | Freq: Every day | ORAL | 1 refills | Status: DC

## 2018-11-13 MED ORDER — PROPRANOLOL HCL 10 MG PO TABS: ORAL_TABLET | ORAL | 1 refills | Status: DC

## 2018-11-13 NOTE — Progress Notes (Signed)
[name] [id number] 1980-09-01 38 y.o.  Subjective:   Patient ID:  Breanna Young is a 38 y.o. (DOB 1980-03-12) female.  Chief Complaint:  Chief Complaint  Patient presents with  . Follow-up    Anxiety, Depression, Insomnia    HPI Breanna Young presents to the office today for follow-up of anxiety and depression. has been "so, so."  Had panic attack around Labor day while sleeping and reports that it was severe and had to take more Klonopin than usual to decrease anxiety. Denies any recent panic attacks. Reports worry and rumination have improved with therapy. Denies depressed mood- "I just don't feel 100% right now." Has been exercising. Energy and motivation have been good. Appetite has been good. Sleep has been fair. Reports that her sleep is fragmented at times. Denies nightmares. Concentration has been decreased with HA's and migraines. Denies SI.  Past Psychiatric Medication Trials: Viibryd- More effective at 40 mg dose and then had more "fogginess" and some sexual side effective. Not effective at 20 mg.  Trintellix- Helpful for depression Zoloft-sexual side effects. Felt more "upbeat."  Lexapro-adverse reaction Wellbutrin XL-increased anxiety and felt fuzzy headed Rexulti-affect of dulling, weight gain Trazodone Ativan Xanax Klonopin Doxazosin-dissociation BuSpar-adverse reaction BC Powder Excedrin Migraine Tizanidine- ineffective for migraines   Review of Systems:  Review of Systems  Musculoskeletal: Negative for gait problem.  Skin: Positive for rash.  Neurological: Positive for headaches. Negative for tremors.       Vertigo and nausea with nausea. Has had cluster migraines.  Psychiatric/Behavioral:       Please refer to HPI   Reports having migraines at least twice a week.   Medications: I have reviewed the patient's current medications.  Current Outpatient Medications  Medication Sig Dispense Refill  . CALCIUM-MAGNESIUM PO Take by mouth.    .  clonazePAM (KLONOPIN) 1 MG tablet TAKE ONE TAB BY MOUTH TWICE DAILY AND 1 TAB BY MOUTH AS NEEDED FOR ANXIETY 75 tablet 1  . Lactobacillus (PROBIOTIC ACIDOPHILUS PO) Take by mouth.    . loratadine-pseudoephedrine (CLARITIN-D 24-HOUR) 10-240 MG 24 hr tablet Take 1 tablet by mouth daily.    . Multiple Vitamin (MULTIVITAMIN) LIQD Take 5 mLs by mouth daily.    Marland Kitchen omega-3 fish oil (MAXEPA) 1000 MG CAPS capsule Take 2 capsules by mouth daily.    . pantoprazole (PROTONIX) 40 MG tablet Take 40 mg by mouth daily.    . psyllium (METAMUCIL) 58.6 % packet Take 1 packet by mouth daily as needed.     Marland Kitchen tiZANidine (ZANAFLEX) 4 MG capsule Take 4 mg by mouth daily as needed for muscle spasms.    . Glucosamine-Chondroitin (GLUCOSAMINE CHONDR COMPLEX PO) Take 3 tablets by mouth.    . lamoTRIgine (LAMICTAL) 150 MG tablet Take 1 tablet (150 mg total) by mouth daily. 90 tablet 1  . propranolol (INDERAL) 10 MG tablet Take 1-2 tabs po BID prn anxiety or headaches 120 tablet 1  . traZODone (DESYREL) 100 MG tablet Take 1 tablet (100 mg total) by mouth at bedtime. 90 tablet 1  . Ubrogepant (UBRELVY) 50 MG TABS Take 50 mg by mouth daily as needed. 2 tablet 0  . Vilazodone HCl (VIIBRYD) 20 MG TABS Take 1.5 tablets (30 mg total) by mouth daily. 135 tablet 1   No current facility-administered medications for this visit.     Medication Side Effects: Other: Occ constipation  Allergies:  Allergies  Allergen Reactions  . Avelox [Moxifloxacin Hcl In Nacl]   . Dexlansoprazole   .  Doxazosin     Past Medical History:  Diagnosis Date  . GERD (gastroesophageal reflux disease)   . IBS (irritable bowel syndrome)     Family History  Problem Relation Age of Onset  . Depression Mother   . Heart attack Father   . Diabetes Father   . Depression Maternal Grandmother   . Endometrial cancer Maternal Grandmother   . Dementia Maternal Grandfather   . Diabetes Maternal Grandfather   . Dementia Paternal Grandmother     Social  History   Socioeconomic History  . Marital status: Single    Spouse name: Not on file  . Number of children: Not on file  . Years of education: Not on file  . Highest education level: Not on file  Occupational History  . Occupation: hair stylist  Social Needs  . Financial resource strain: Not on file  . Food insecurity    Worry: Not on file    Inability: Not on file  . Transportation needs    Medical: Not on file    Non-medical: Not on file  Tobacco Use  . Smoking status: Never Smoker  . Smokeless tobacco: Never Used  Substance and Sexual Activity  . Alcohol use: Not Currently    Alcohol/week: 2.0 standard drinks    Types: 2 Glasses of wine per week  . Drug use: Never  . Sexual activity: Yes    Partners: Male    Birth control/protection: I.U.D.  Lifestyle  . Physical activity    Days per week: 4 days    Minutes per session: 60 min  . Stress: Not on file  Relationships  . Social Herbalist on phone: Not on file    Gets together: Not on file    Attends religious service: Not on file    Active member of club or organization: Not on file    Attends meetings of clubs or organizations: Not on file    Relationship status: Not on file  . Intimate partner violence    Fear of current or ex partner: Not on file    Emotionally abused: Not on file    Physically abused: Not on file    Forced sexual activity: Not on file  Other Topics Concern  . Not on file  Social History Narrative  . Not on file    Past Medical History, Surgical history, Social history, and Family history were reviewed and updated as appropriate.   Please see review of systems for further details on the patient's review from today.   Objective:   Physical Exam:  BP 110/77   Pulse 85   Wt 137 lb (62.1 kg)   BMI 22.80 kg/m   Physical Exam  Lab Review:  No results found for: NA, K, CL, CO2, GLUCOSE, BUN, CREATININE, CALCIUM, PROT, ALBUMIN, AST, ALT, ALKPHOS, BILITOT, GFRNONAA,  GFRAA  No results found for: WBC, RBC, HGB, HCT, PLT, MCV, MCH, MCHC, RDW, LYMPHSABS, MONOABS, EOSABS, BASOSABS  No results found for: POCLITH, LITHIUM   No results found for: PHENYTOIN, PHENOBARB, VALPROATE, CBMZ   .res Assessment: Plan:   Discussed potential benefits, risks, and side effects of propanolol and Ubrelvy.  Discussed that [name] could be taken as needed at the onset of migraine.  Provided patient with samples of Ubrelvy.  Discussed that propanolol is used to treat both anxiety and chronic headaches.  Discussed that propanolol may be helpful as needed if she has another severe panic attack with significantly increased heart rate. Recommend  continuing all other medications as prescribed.   Recommend continuing psychotherapy with Lina Sayre, [place]. Patient to follow-up with this provider in 3 months or sooner if clinically indicated.  Jamora was seen today for follow-up.  Diagnoses and all orders for this visit:  Generalized anxiety disorder -     propranolol (INDERAL) 10 MG tablet; Take 1-2 tabs po BID prn anxiety or headaches -     Vilazodone HCl (VIIBRYD) 20 MG TABS; Take 1.5 tablets (30 mg total) by mouth daily.  Intractable migraine with status migrainosus, unspecified migraine type -     Ubrogepant (UBRELVY) 50 MG TABS; Take 50 mg by mouth daily as needed. -     propranolol (INDERAL) 10 MG tablet; Take 1-2 tabs po BID prn anxiety or headaches  Recurrent major depressive disorder, in full remission (HCC) -     lamoTRIgine (LAMICTAL) 150 MG tablet; Take 1 tablet (150 mg total) by mouth daily. -     Vilazodone HCl (VIIBRYD) 20 MG TABS; Take 1.5 tablets (30 mg total) by mouth daily.  PTSD (post-traumatic stress disorder) -     Vilazodone HCl (VIIBRYD) 20 MG TABS; Take 1.5 tablets (30 mg total) by mouth daily.  Insomnia due to mental disorder -     traZODone (DESYREL) 100 MG tablet; Take 1 tablet (100 mg total) by mouth at bedtime.     Please see After Visit  Summary for patient specific instructions.  Future Appointments  Date Time Provider [place]  12/18/2018  1:00 PM Lina Sayre, [facility] CP-CP None  02/05/2019  1:30 PM [name], PMHNP CP-CP None    No orders of the defined types were placed in this encounter.   -------------------------------

## 2018-11-22 DIAGNOSIS — K219 Gastro-esophageal reflux disease without esophagitis: Secondary | ICD-10-CM | POA: Diagnosis not present

## 2018-11-22 DIAGNOSIS — Z0000 Encounter for general adult medical examination without abnormal findings: Secondary | ICD-10-CM | POA: Diagnosis not present

## 2018-11-22 DIAGNOSIS — E162 Hypoglycemia, unspecified: Secondary | ICD-10-CM | POA: Diagnosis not present

## 2018-11-22 DIAGNOSIS — G43009 Migraine without aura, not intractable, without status migrainosus: Secondary | ICD-10-CM | POA: Diagnosis not present

## 2018-11-22 DIAGNOSIS — M25562 Pain in left knee: Secondary | ICD-10-CM | POA: Diagnosis not present

## 2018-11-22 DIAGNOSIS — R519 Headache, unspecified: Secondary | ICD-10-CM | POA: Diagnosis not present

## 2018-11-22 DIAGNOSIS — R42 Dizziness and giddiness: Secondary | ICD-10-CM | POA: Diagnosis not present

## 2018-11-22 DIAGNOSIS — M25561 Pain in right knee: Secondary | ICD-10-CM | POA: Diagnosis not present

## 2018-12-05 ENCOUNTER — Other Ambulatory Visit: Payer: Self-pay | Admitting: Psychiatry

## 2018-12-05 DIAGNOSIS — G43911 Migraine, unspecified, intractable, with status migrainosus: Secondary | ICD-10-CM

## 2018-12-05 DIAGNOSIS — F411 Generalized anxiety disorder: Secondary | ICD-10-CM

## 2018-12-18 ENCOUNTER — Encounter: Payer: Self-pay | Admitting: Psychiatry

## 2018-12-18 ENCOUNTER — Ambulatory Visit (INDEPENDENT_AMBULATORY_CARE_PROVIDER_SITE_OTHER): Payer: BC Managed Care – PPO | Admitting: Psychiatry

## 2018-12-18 ENCOUNTER — Other Ambulatory Visit: Payer: Self-pay

## 2018-12-18 ENCOUNTER — Encounter: Payer: Self-pay | Admitting: Internal Medicine

## 2018-12-18 ENCOUNTER — Ambulatory Visit: Payer: BC Managed Care – PPO | Admitting: Internal Medicine

## 2018-12-18 VITALS — BP 122/80 | HR 89 | Temp 97.9°F | Ht 65.0 in | Wt 139.8 lb

## 2018-12-18 DIAGNOSIS — Z1159 Encounter for screening for other viral diseases: Secondary | ICD-10-CM | POA: Diagnosis not present

## 2018-12-18 DIAGNOSIS — K219 Gastro-esophageal reflux disease without esophagitis: Secondary | ICD-10-CM | POA: Diagnosis not present

## 2018-12-18 DIAGNOSIS — F4310 Post-traumatic stress disorder, unspecified: Secondary | ICD-10-CM

## 2018-12-18 MED ORDER — PANTOPRAZOLE SODIUM 40 MG PO TBEC: 40.0000 mg | DELAYED_RELEASE_TABLET | Freq: Two times a day (BID) | ORAL | 3 refills | Status: DC

## 2018-12-18 NOTE — Progress Notes (Signed)
[facility] Counselor/Therapist Progress Note  Patient ID: Breanna Young, MRN: [id number],    Date: 12/18/2018  Time Spent: 50 minutes start time 1:07 PM end time 1:57 PM  Treatment Type: Individual Therapy  Reported Symptoms: anxiety, triggered responses, migraines, sleep issues  Mental Status Exam:  Appearance:   Well Groomed     Behavior:  Sharing  Motor:  Normal  Speech/Language:   Normal Rate  Affect:  Congruent  Mood:  anxious  Thought process:  normal  Thought content:    WNL  Sensory/Perceptual disturbances:    WNL  Orientation:  oriented to person, place, time/date and situation  Attention:  Good  Concentration:  Good  Memory:  WNL  Fund of knowledge:   Good  Insight:    Good  Judgment:   Good  Impulse Control:  Good   Risk Assessment: Danger to Self:  No Self-injurious Behavior: No Danger to Others: No Duty to Warn:no Physical Aggression / Violence:No  Access to Firearms a concern: No  Gang Involvement:No   Subjective: Patient was present for session. She did not go to Thanksgiving with her family even though her mother tried to get lots of people to manipulate her into going.  Patient was encouraged to feel positive about the fact that she was able to set that limit with her mother and follow through on what was important to her.  Patient also shared that she has not had any contact with her mom since October and is still not sure she will have contacted in December because they are making different choices when it comes to the COVID virus.  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  Patient was encouraged to feel good about the fact that she is making a stand for herself rather than feeling that she has to give then to what her mother does even when her mother is not thinking about what is best for patient.  Patient also shared that she has been making  lots of contact with her biological dad and that has been a very positive thing for her.  She explained that her mother is not happy with that but she is not concerned but wanting to continue the relationship.  She also shared that her nightmares have gone away and she has not thought about her ex anytime recently.  Acknowledge the fact that we still need to do EMDR on her boyfriend in prison but he is not getting out for 3 years so there is still time.  Patient went on to explain her biggest issue currently is she is had a migraine for 3 days and had 1 in session.  She shared that it is gotten bad enough where she has had some vertigo to go along with that and she is not sure what to do.  Sent message to patient's provider [name], PMH, NP to let her know about the situation.  She had asked if she could be contacted to see if there was anything she could do to help with the situation because she did not feel positive about the interaction with her PCP and would like to have a referral to a neurologist.  Patient was encouraged to make sure she is doing everything she can including staying hydrated exercising when possible, keeping her stress down is much as possible and working on noticing if migraines  come from and add any different foods in her diet.  Patient reported she is try to do all of those things currently and will continue.  Patient was encouraged to feel good about the progress she is made and to continue reminding herself that she is worth it and utilizing coping skills that have been helpful. Interventions: Solution-Oriented/Positive Psychology  Diagnosis:   ICD-10-CM   1. Posttraumatic stress disorder  F43.10     Plan: Patient is to continue utilizing CBT and coping skills to help manage her triggered responses appropriately.  Patient is to follow-up on her migraines to see if there is anything that can be done about them.  Patient is to continue working on diet and exercise and using  her positive affirmations regularly. Long-term goal: Develop and implement effective coping skills to carry out normal responsibilities and participate constructively in relationships Short-term goal: Implement a regular exercise regimen as a stress reduction technique Identify and replace negative self talk and catastrophisizing that is associated with past trauma and current stimulus triggers for anxiety  Lina Sayre, [facility]

## 2018-12-18 NOTE — Patient Instructions (Addendum)
We have sent the following medications to your pharmacy for you to pick up at your convenience:  Protonix  You have been scheduled for an endoscopy. Please follow written instructions given to you at your visit today. If you use inhalers (even only as needed), please bring them with you on the day of your procedure.   

## 2018-12-18 NOTE — Progress Notes (Signed)
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  Patient tells me that she has had a greater than 10-year history of GERD initially manifested by pyrosis and regurgitation.  She was subsequently placed on pantoprazole and did well until recent months.  Since then she has noticed significant problems with regurgitation, particularly at night.  She describes several episodes of coughing choking and what sounds like laryngospasm related to regurgitation of the refluxate.  On one occasion she did notice food material.  She does tell me that she avoids large meals prior to bedtime and has her head elevated with pillows.  She does not smoke.  Mild alcohol use.  She denies dysphagia.  She does have bloating and occasional postprandial fullness but this has not progressed.  Her weight has been stable.  She was recently seen by her primary provider.  Pantoprazole was increased to 40 mg twice daily.  This seems to help some.  The patient tells me that she underwent previous upper endoscopy, but this has been greater than 10 years ago.  She was told that she has a hiatal hernia.  Next, she does have chronic stable irritable bowel syndrome as manifested by alternating bowel habits.  Unchanged.  No bloating.  REVIEW OF SYSTEMS:  All non-GI ROS negative unless otherwise stated in the HPI except for sinus and allergy trouble, anxiety, back pain, depression, headaches, night sweats, sleeping problems  Past Medical History:  Diagnosis Date  . GERD (gastroesophageal reflux disease)   . Hiatal hernia   . IBS (irritable bowel syndrome)     Past Surgical History:  Procedure Laterality Date  . TONSILLECTOMY      Social History Daje Monsen  reports that she has never smoked. She has never used smokeless tobacco. She reports previous  alcohol use of about 2.0 standard drinks of alcohol per week. She reports that she does not use drugs.  family history includes Dementia in her maternal grandfather and paternal grandmother; Depression in her maternal grandmother and mother; Diabetes in her father and maternal grandfather; Endometrial cancer in her maternal grandmother; Heart attack in her father.  Allergies  Allergen Reactions  . Avelox [Moxifloxacin Hcl In Nacl]   . Dexlansoprazole   . Doxazosin        PHYSICAL EXAMINATION: Vital signs: BP 122/80 (BP Location: Left Arm, Patient Position: Sitting)   Pulse 89   Temp 97.9 F (36.6 C)   Ht 5\' 5"  (1.651 m)   Wt 139 lb 12.8 oz (63.4 kg)   SpO2 99%   BMI 23.26 kg/m   Constitutional: generally well-appearing, no acute distress Psychiatric: alert and oriented x3, cooperative Eyes: extraocular movements intact, anicteric, conjunctiva pink Mouth: oral pharynx moist, no lesions Neck: supple no lymphadenopathy Cardiovascular: heart regular rate and rhythm, no murmur Lungs: clear to auscultation bilaterally Abdomen: soft, nontender, nondistended, no obvious ascites, no peritoneal signs, normal bowel sounds, no organomegaly Rectal: Omitted Extremities: no clubbing, cyanosis, or lower extremity edema bilaterally Skin: no lesions on visible extremities Neuro: No focal deficits.  Cranial nerves intact  ASSESSMENT:  1.  Chronic GERD.  Now exacerbated with nocturnal regurgitation and aspiration episodes.  Rule out progressive hiatal hernia.  Rule out gastroparesis 2.  Irritable bowel syndrome  PLAN:  1.  Reflux precautions reviewed 2.  Schedule upper endoscopy.The nature of the procedure, as  well as the risks, benefits, and alternatives were carefully and thoroughly reviewed with the patient. Ample time for discussion and questions allowed. The patient understood, was satisfied, and agreed to proceed. 3.  Consider gastric emptying scan 4.  Continue twice daily PPI 5.   High-fiber diet 6.  Further recommendations pending the above work-up.  She might be considered for some type of antireflux procedure.  We discussed this briefly

## 2018-12-23 ENCOUNTER — Ambulatory Visit (INDEPENDENT_AMBULATORY_CARE_PROVIDER_SITE_OTHER): Payer: BC Managed Care – PPO

## 2018-12-23 DIAGNOSIS — Z1159 Encounter for screening for other viral diseases: Secondary | ICD-10-CM

## 2018-12-24 LAB — SARS CORONAVIRUS 2 (TAT 6-24 HRS): SARS Coronavirus 2: NEGATIVE

## 2018-12-25 ENCOUNTER — Ambulatory Visit (AMBULATORY_SURGERY_CENTER): Payer: BC Managed Care – PPO | Admitting: Internal Medicine

## 2018-12-25 ENCOUNTER — Other Ambulatory Visit: Payer: Self-pay

## 2018-12-25 ENCOUNTER — Encounter: Payer: Self-pay | Admitting: Internal Medicine

## 2018-12-25 VITALS — BP 107/73 | HR 60 | Temp 98.5°F | Resp 15 | Ht 65.0 in | Wt 139.0 lb

## 2018-12-25 DIAGNOSIS — K219 Gastro-esophageal reflux disease without esophagitis: Secondary | ICD-10-CM | POA: Diagnosis not present

## 2018-12-25 DIAGNOSIS — K317 Polyp of stomach and duodenum: Secondary | ICD-10-CM

## 2018-12-25 MED ORDER — SODIUM CHLORIDE 0.9 % IV SOLN: 500.0000 mL | Freq: Once | INTRAVENOUS | Status: DC

## 2018-12-25 NOTE — Op Note (Signed)
[facility] Patient Name: Breanna Young Procedure Date: 12/25/2018 2:26 PM MRN: [id number] Endoscopist: Docia Chuck. Henrene Pastor , MD Age: 38 Referring MD:  Date of Birth: 09-18-1980 Gender: Female Account #: [id number] Procedure:                Upper GI endoscopy with biopsies Indications:              Esophageal reflux. Problems in recent months with                            nocturnal regurgitation. Had been on once daily                            pantoprazole, now twice daily pantoprazole. No                            dysphagia Medicines:                Monitored Anesthesia Care Procedure:                Pre-Anesthesia Assessment:                           - Prior to the procedure, a History and Physical                            was performed, and patient medications and                            allergies were reviewed. The patient's tolerance of                            previous anesthesia was also reviewed. The risks                            and benefits of the procedure and the sedation                            options and risks were discussed with the patient.                            All questions were answered, and informed consent                            was obtained. Prior Anticoagulants: The patient has                            taken no previous anticoagulant or antiplatelet                            agents. ASA Grade Assessment: I - A normal, healthy                            patient. After reviewing the risks and benefits,  the patient was deemed in satisfactory condition to                            undergo the procedure.                           After obtaining informed consent, the endoscope was                            passed under direct vision. Throughout the                            procedure, the patient's blood pressure, pulse, and                            oxygen saturations were monitored continuously.  The                            Endoscope was introduced through the mouth, and                            advanced to the second part of duodenum. The upper                            GI endoscopy was accomplished without difficulty.                            The patient tolerated the procedure well. Scope In: Scope Out: Findings:                 The esophagus was normal.                           The stomach was normal except for a few diminutive                            benign fundic gland type polyps. Biopsies were                            taken with a cold forceps for histology.                           The examined duodenum was normal.                           The cardia and gastric fundus were normal on                            retroflexion. Patient has no or negligible hiatal                            hernia. Complications:            No immediate complications. Estimated Blood Loss:     Estimated blood loss: none. Impression:               -  Normal esophagus.                           - Normal stomach save a few small polyps. Biopsied.                           - Normal examined duodenum. Recommendation:           1. Resume previous diet and medications                           2. Schedule barium swallow "assess for evidence of                            reflux"                           3. Schedule office appointment with Dr. Gerrit Heck [facility] OFFICE) for consultation regarding                            possible TIF (transoral incision less                            fundoplication) procedure. Docia Chuck. Henrene Pastor, MD 12/25/2018 3:11:44 PM This report has been signed electronically.

## 2018-12-25 NOTE — Progress Notes (Signed)
Called to room to assist during endoscopic procedure.  Patient ID and intended procedure confirmed with present staff. Received instructions for my participation in the procedure from the performing physician.  

## 2018-12-25 NOTE — Progress Notes (Signed)
Vitals DT Temp lisa

## 2018-12-25 NOTE — Patient Instructions (Signed)
Please read handouts provided. Continue present medications.      YOU HAD AN ENDOSCOPIC PROCEDURE TODAY AT [facility]:   Refer to the procedure report that was given to you for any specific questions about what was found during the examination.  If the procedure report does not answer your questions, please call your gastroenterologist to clarify.  If you requested that your care partner not be given the details of your procedure findings, then the procedure report has been included in a sealed envelope for you to review at your convenience later.  YOU SHOULD EXPECT: Some feelings of bloating in the abdomen. Passage of more gas than usual.  Walking can help get rid of the air that was put into your GI tract during the procedure and reduce the bloating. If you had a lower endoscopy (such as a colonoscopy or flexible sigmoidoscopy) you may notice spotting of blood in your stool or on the toilet paper. If you underwent a bowel prep for your procedure, you may not have a normal bowel movement for a few days.  Please Note:  You might notice some irritation and congestion in your nose or some drainage.  This is from the oxygen used during your procedure.  There is no need for concern and it should clear up in a day or so.  SYMPTOMS TO REPORT IMMEDIATELY:     Following upper endoscopy (EGD)  Vomiting of blood or coffee ground material  New chest pain or pain under the shoulder blades  Painful or persistently difficult swallowing  New shortness of breath  Fever of 100F or higher  Black, tarry-looking stools  For urgent or emergent issues, a gastroenterologist can be reached at any hour by calling [phone].   DIET:  We do recommend a small meal at first, but then you may proceed to your regular diet.  Drink plenty of fluids but you should avoid alcoholic beverages for 24 hours.  ACTIVITY:  You should plan to take it easy for the rest of today and you should NOT DRIVE or  use heavy machinery until tomorrow (because of the sedation medicines used during the test).    FOLLOW UP: Our staff will call the number listed on your records 48-72 hours following your procedure to check on you and address any questions or concerns that you may have regarding the information given to you following your procedure. If we do not reach you, we will leave a message.  We will attempt to reach you two times.  During this call, we will ask if you have developed any symptoms of COVID 19. If you develop any symptoms (ie: fever, flu-like symptoms, shortness of breath, cough etc.) before then, please call [phone].  If you test positive for Covid 19 in the 2 weeks post procedure, please call and report this information to [place].    If any biopsies were taken you will be contacted by phone or by letter within the next 1-3 weeks.  Please call us at [phone] if you have not heard about the biopsies in 3 weeks.    SIGNATURES/CONFIDENTIALITY: You and/or your care partner have signed paperwork which will be entered into your electronic medical record.  These signatures attest to the fact that that the information above on your After Visit Summary has been reviewed and is understood.  Full responsibility of the confidentiality of this discharge information lies with you and/or your care-partner.

## 2018-12-25 NOTE — Progress Notes (Signed)
Pt Drowsy. VSS. To PACU, report to RN. No anesthetic complications noted.  

## 2018-12-26 ENCOUNTER — Other Ambulatory Visit: Payer: Self-pay

## 2018-12-26 ENCOUNTER — Telehealth: Payer: Self-pay

## 2018-12-26 DIAGNOSIS — K219 Gastro-esophageal reflux disease without esophagitis: Secondary | ICD-10-CM

## 2018-12-26 NOTE — Telephone Encounter (Signed)
Pt scheduled for barium swallow at [facility] 12/31/18@10 :30am, pt to arrive there at 10:15am, pt to be npo after 7:30am. Pt scheduled to see Dr. Bryan Lemma in [place] 02/14/19@1 :40pm. Pt aware of appts.

## 2018-12-27 ENCOUNTER — Telehealth: Payer: Self-pay | Admitting: *Deleted

## 2018-12-27 NOTE — Telephone Encounter (Signed)
Follow up call made, left message. 

## 2018-12-27 NOTE — Telephone Encounter (Signed)
Message left

## 2018-12-30 ENCOUNTER — Encounter: Payer: Self-pay | Admitting: Internal Medicine

## 2018-12-31 ENCOUNTER — Ambulatory Visit (HOSPITAL_COMMUNITY)
Admission: RE | Admit: 2018-12-31 | Discharge: 2018-12-31 | Disposition: A | Discharge status: Home / Self Care | Payer: BC Managed Care – PPO | Source: Ambulatory Visit | Attending: Internal Medicine | Admitting: Internal Medicine

## 2018-12-31 ENCOUNTER — Other Ambulatory Visit: Payer: Self-pay

## 2018-12-31 DIAGNOSIS — K219 Gastro-esophageal reflux disease without esophagitis: Secondary | ICD-10-CM | POA: Diagnosis not present

## 2019-01-01 ENCOUNTER — Encounter: Payer: Self-pay | Admitting: Psychiatry

## 2019-01-01 ENCOUNTER — Ambulatory Visit (INDEPENDENT_AMBULATORY_CARE_PROVIDER_SITE_OTHER): Payer: BC Managed Care – PPO | Admitting: Psychiatry

## 2019-01-01 DIAGNOSIS — F5105 Insomnia due to other mental disorder: Secondary | ICD-10-CM | POA: Diagnosis not present

## 2019-01-01 DIAGNOSIS — G43911 Migraine, unspecified, intractable, with status migrainosus: Secondary | ICD-10-CM

## 2019-01-01 MED ORDER — SUMATRIPTAN SUCCINATE 50 MG PO TABS: 50.0000 mg | ORAL_TABLET | ORAL | 0 refills | Status: DC | PRN

## 2019-01-01 MED ORDER — TOPIRAMATE 25 MG PO TABS: ORAL_TABLET | ORAL | 0 refills | Status: DC

## 2019-01-01 NOTE — Progress Notes (Signed)
Breanna Young [id number] Nov 03, 1980 38 y.o.  Virtual Visit via Telephone Note  I connected with pt on 01/01/19 at  2:00 PM EST by telephone and verified that I am speaking with the correct person using two identifiers.   I discussed the limitations, risks, security and privacy concerns of performing an evaluation and management service by telephone and the availability of in person appointments. I also discussed with the patient that there may be a patient responsible charge related to this service. The patient expressed understanding and agreed to proceed.   I discussed the assessment and treatment plan with the patient. The patient was provided an opportunity to ask questions and all were answered. The patient agreed with the plan and demonstrated an understanding of the instructions.   The patient was advised to call back or seek an in-person evaluation if the symptoms worsen or if the condition fails to improve as anticipated.  I provided 30 minutes of non-face-to-face time during this encounter.  The patient was located at home.  The provider was located at [place].   Breanna Young, PMHNP   Subjective:   Patient ID:  Breanna Young is a 38 y.o. (DOB Dec 08, 1980) female.  Chief Complaint:  Chief Complaint  Patient presents with  . Headache    HPI Breanna Young presents for follow-up of anxiety, depression, and insomnia.  She reports that she has had some severe migraines including migraines for 3 days straight with nausea and vertigo. She has had HA's more than migraines. She reports that migraines are always on the left side of her head near her temple. She reports that the pain will awaken her at night at times. Some partial improvement in chronic HA's with Propranolol and no improvement in migraines. Has some vertigo with chronic HA's. Will feel slightly disoriented with migraines. Breanna Young was ineffective. Has had 2-3 separate migraines that can last 2-3 days.  Has nausea and noise and light sensitivity with migraines.   Headaches and migraines started in June and have increasingly worsened since then.   Had head CT scan and was told that results were WNL.   Mood and anxiety have been stable overall aside from occ situational stress. She reports that she has not been sleeping as well. Having frequent awakenings. She reports that she has been having frequent dreams, not nightmares. Appetite has been ok. Energy and motivation have been lower, possibly due to disrupted sleep. Concentration is fair. Notices that she will lose track in conversations. Denies SI.   Past Psychiatric Medication Trials: Viibryd- More effective at 40 mg dose and then had more "fogginess" and some sexual side effective. Not effective at 20 mg. Trintellix- Helpful for depression Zoloft-sexual side effects. Felt more "upbeat." Lexapro-adverse reaction Wellbutrin XL-increased anxiety and felt fuzzy headed Rexulti-affect of dulling, weight gain Trazodone Ativan Xanax Klonopin Doxazosin-dissociation BuSpar-adverse reaction BC Powder- Ineffective Excedrin Migraine Tizanidine- ineffective for migraines Propranolol Ubrelvy  Review of Systems:  Review of Systems  Gastrointestinal:       Improved constipation with organic powder greens  Genitourinary:       Recent spotting and has had some menstrual bleeding after not having menses with Mirena.   Musculoskeletal: Negative for gait problem.  Neurological: Positive for dizziness and headaches. Negative for tremors.  Psychiatric/Behavioral:       Please refer to HPI    Medications: None  Current Outpatient Medications  Medication Sig Dispense Refill  . CALCIUM-MAGNESIUM PO Take by mouth.    . clonazePAM (KLONOPIN) 1 MG  tablet TAKE ONE TAB BY MOUTH TWICE DAILY AND 1 TAB BY MOUTH AS NEEDED FOR ANXIETY (Patient taking differently: 3 (three) times daily as needed. TAKE ONE TAB BY MOUTH TWICE DAILY AND 1 TAB BY MOUTH AS  NEEDED FOR ANXIETY) 75 tablet 1  . Glucosamine-Chondroitin (GLUCOSAMINE CHONDR COMPLEX PO) Take 3 tablets by mouth.    . Lactobacillus (PROBIOTIC ACIDOPHILUS PO) Take by mouth.    . lamoTRIgine (LAMICTAL) 150 MG tablet Take 1 tablet (150 mg total) by mouth daily. 90 tablet 1  . levonorgestrel (MIRENA) 20 MCG/24HR IUD 1 each by Intrauterine route once.    . loratadine-pseudoephedrine (CLARITIN-D 24-HOUR) 10-240 MG 24 hr tablet Take 1 tablet by mouth daily.    . Multiple Vitamin (MULTIVITAMIN) tablet Take 1 tablet by mouth daily.    Marland Kitchen omega-3 fish oil (MAXEPA) 1000 MG CAPS capsule Take 2 capsules by mouth daily.    . pantoprazole (PROTONIX) 40 MG tablet Take 1 tablet (40 mg total) by mouth 2 (two) times daily. 180 tablet 3  . tiZANidine (ZANAFLEX) 4 MG capsule Take 4 mg by mouth daily as needed for muscle spasms.    . traZODone (DESYREL) 100 MG tablet Take 1 tablet (100 mg total) by mouth at bedtime. 90 tablet 1  . Vilazodone HCl (VIIBRYD) 20 MG TABS Take 1.5 tablets (30 mg total) by mouth daily. 135 tablet 1  . SUMAtriptan (IMITREX) 50 MG tablet Take 1 tablet (50 mg total) by mouth every 2 (two) hours as needed for migraine. May repeat in 2 hours if headache persists or recurs. 10 tablet 0  . topiramate (TOPAMAX) 25 MG tablet Start 25 mg po QHS for one week, then may increase to 2 tabs po QHS 60 tablet 0   No current facility-administered medications for this visit.    Medication Side Effects: None  Allergies:  Allergies  Allergen Reactions  . Avelox [Moxifloxacin Hcl In Nacl]   . Dexlansoprazole   . Doxazosin     Past Medical History:  Diagnosis Date  . GERD (gastroesophageal reflux disease)   . Hiatal hernia   . IBS (irritable bowel syndrome)     Family History  Problem Relation Age of Onset  . Depression Mother   . Heart attack Father   . Diabetes Father   . Depression Maternal Grandmother   . Endometrial cancer Maternal Grandmother   . Dementia Maternal Grandfather   .  Diabetes Maternal Grandfather   . Dementia Paternal Grandmother     Social History   Socioeconomic History  . Marital status: Single    Spouse name: Not on file  . Number of children: Not on file  . Years of education: Not on file  . Highest education level: Not on file  Occupational History  . Occupation: hair stylist  Tobacco Use  . Smoking status: Never Smoker  . Smokeless tobacco: Never Used  Substance and Sexual Activity  . Alcohol use: Not Currently    Alcohol/week: 2.0 standard drinks    Types: 2 Glasses of wine per week  . Drug use: Never  . Sexual activity: Yes    Partners: Male    Birth control/protection: I.U.D.  Other Topics Concern  . Not on file  Social History Narrative  . Not on file   Social Determinants of Health   Financial Resource Strain:   . Difficulty of Paying Living Expenses: Not on file  Food Insecurity:   . Worried About Charity fundraiser in the Last Year: Not  on file  . Ran Out of Food in the Last Year: Not on file  Transportation Needs:   . Lack of Transportation (Medical): Not on file  . Lack of Transportation (Non-Medical): Not on file  Physical Activity:   . Days of Exercise per Week: Not on file  . Minutes of Exercise per Session: Not on file  Stress:   . Feeling of Stress : Not on file  Social Connections:   . Frequency of Communication with Friends and Family: Not on file  . Frequency of Social Gatherings with Friends and Family: Not on file  . Attends Religious Services: Not on file  . Active Member of Clubs or Organizations: Not on file  . Attends Archivist Meetings: Not on file  . Marital Status: Not on file  Intimate Partner Violence:   . Fear of Current or Ex-Partner: Not on file  . Emotionally Abused: Not on file  . Physically Abused: Not on file  . Sexually Abused: Not on file    Past Medical History, Surgical history, Social history, and Family history were reviewed and updated as appropriate.    Please see review of systems for further details on the patient's review from today.   Objective:   Physical Exam:  There were no vitals taken for this visit.  Physical Exam Neurological:     Mental Status: She is alert and oriented to person, place, and time.     Cranial Nerves: No dysarthria.  Psychiatric:        Attention and Perception: Attention and perception normal.        Mood and Affect: Mood normal.        Speech: Speech normal.        Behavior: Behavior is cooperative.        Thought Content: Thought content normal. Thought content is not paranoid or delusional. Thought content does not include homicidal or suicidal ideation. Thought content does not include homicidal or suicidal plan.        Cognition and Memory: Cognition and memory normal.        Judgment: Judgment normal.     Comments: Insight intact     Lab Review:  No results found for: NA, K, CL, CO2, GLUCOSE, BUN, CREATININE, CALCIUM, PROT, ALBUMIN, AST, ALT, ALKPHOS, BILITOT, GFRNONAA, GFRAA  No results found for: WBC, RBC, HGB, HCT, PLT, MCV, MCH, MCHC, RDW, LYMPHSABS, MONOABS, EOSABS, BASOSABS  No results found for: POCLITH, LITHIUM   No results found for: PHENYTOIN, PHENOBARB, VALPROATE, CBMZ   .res Assessment: Plan:   Patient seen for 30 minutes and greater than 50% of visit spent counseling patient and coordination of care, to include reviewing results of CT scan with patient and explaining findings since patient reports that she was not clear what results meant based on callback she had received.  Discussed that CT scan was unremarkable for acute underlying medical cause for recent headaches, migraines, and vertigo.  Discussed potential benefits, risks, and side effects of treatment options for headaches and migraines to include Topamax, Imitrex, and possibly CGRP inhibitor.  Patient agrees with trial of low-dose Topamax to improve headaches.  Discussed that dose may need to be titrated based on  tolerability.  Discussed that Topamax may also be helpful for insomnia and anxiety. Patient also agrees to using Imitrex as needed for migraine relief. Discussed discontinuing propanolol since patient has not experienced any significant improvement in headaches with propanolol. Will continue all other medications as prescribed. Recommend continuing psychotherapy  with Lina Sayre, LPC. Patient to follow-up with this provider in 4 weeks or sooner if clinically indicated. Patient advised to contact office with any questions, adverse effects, or acute worsening in signs and symptoms.  Jaila was seen today for headache.  Diagnoses and all orders for this visit:  Intractable migraine with status migrainosus, unspecified migraine type -     topiramate (TOPAMAX) 25 MG tablet; Start 25 mg po QHS for one week, then may increase to 2 tabs po QHS -     SUMAtriptan (IMITREX) 50 MG tablet; Take 1 tablet (50 mg total) by mouth every 2 (two) hours as needed for migraine. May repeat in 2 hours if headache persists or recurs.  Insomnia due to mental disorder -     topiramate (TOPAMAX) 25 MG tablet; Start 25 mg po QHS for one week, then may increase to 2 tabs po QHS    Please see After Visit Summary for patient specific instructions.  Future Appointments  Date Time Provider [place]  02/05/2019 12:00 PM Lina Sayre, [facility] None  02/05/2019  1:30 PM Breanna Young, PMHNP CP-CP None  02/14/2019  1:40 PM Cirigliano, Vito V, DO [facility]    No orders of the defined types were placed in this encounter.     -------------------------------

## 2019-01-06 ENCOUNTER — Other Ambulatory Visit: Payer: Self-pay | Admitting: Psychiatry

## 2019-01-06 DIAGNOSIS — G43911 Migraine, unspecified, intractable, with status migrainosus: Secondary | ICD-10-CM

## 2019-01-06 DIAGNOSIS — F411 Generalized anxiety disorder: Secondary | ICD-10-CM

## 2019-01-09 DIAGNOSIS — Z20828 Contact with and (suspected) exposure to other viral communicable diseases: Secondary | ICD-10-CM | POA: Diagnosis not present

## 2019-01-23 ENCOUNTER — Other Ambulatory Visit: Payer: Self-pay | Admitting: Psychiatry

## 2019-01-23 DIAGNOSIS — G43911 Migraine, unspecified, intractable, with status migrainosus: Secondary | ICD-10-CM

## 2019-01-23 DIAGNOSIS — F5105 Insomnia due to other mental disorder: Secondary | ICD-10-CM

## 2019-01-23 DIAGNOSIS — F411 Generalized anxiety disorder: Secondary | ICD-10-CM

## 2019-01-23 DIAGNOSIS — F4310 Post-traumatic stress disorder, unspecified: Secondary | ICD-10-CM

## 2019-01-24 NOTE — Telephone Encounter (Signed)
Has f/u on 02/05/2019, keep strengths same till then?

## 2019-02-05 ENCOUNTER — Encounter: Payer: Self-pay | Admitting: Psychiatry

## 2019-02-05 ENCOUNTER — Ambulatory Visit (INDEPENDENT_AMBULATORY_CARE_PROVIDER_SITE_OTHER): Payer: BC Managed Care – PPO | Admitting: Psychiatry

## 2019-02-05 ENCOUNTER — Other Ambulatory Visit: Payer: Self-pay

## 2019-02-05 DIAGNOSIS — F3342 Major depressive disorder, recurrent, in full remission: Secondary | ICD-10-CM

## 2019-02-05 DIAGNOSIS — G43911 Migraine, unspecified, intractable, with status migrainosus: Secondary | ICD-10-CM | POA: Diagnosis not present

## 2019-02-05 DIAGNOSIS — F4310 Post-traumatic stress disorder, unspecified: Secondary | ICD-10-CM

## 2019-02-05 DIAGNOSIS — F5105 Insomnia due to other mental disorder: Secondary | ICD-10-CM

## 2019-02-05 DIAGNOSIS — F411 Generalized anxiety disorder: Secondary | ICD-10-CM

## 2019-02-05 MED ORDER — VIIBRYD 20 MG PO TABS: 30.0000 mg | ORAL_TABLET | Freq: Every day | ORAL | 1 refills | Status: DC

## 2019-02-05 MED ORDER — TRAZODONE HCL 100 MG PO TABS: 100.0000 mg | ORAL_TABLET | Freq: Every day | ORAL | 1 refills | Status: DC

## 2019-02-05 MED ORDER — LAMOTRIGINE 150 MG PO TABS: 150.0000 mg | ORAL_TABLET | Freq: Every day | ORAL | 1 refills | Status: DC

## 2019-02-05 MED ORDER — TOPIRAMATE 25 MG PO TABS: 25.0000 mg | ORAL_TABLET | Freq: Every day | ORAL | 1 refills | Status: DC

## 2019-02-05 NOTE — Progress Notes (Signed)
[facility] Counselor/Therapist Progress Note  Patient ID: Breanna Young, MRN: [id number],    Date: 02/05/2019  Time Spent: 50 minutes start time 12:11 PM time 1:01 PM  Treatment Type: Individual Therapy  Reported Symptoms: anxiety, triggered responses  Mental Status Exam:  Appearance:   Well Groomed     Behavior:  Sharing  Motor:  Normal  Speech/Language:   Normal Rate  Affect:  Non-Congruent  Mood:  normal  Thought process:  normal  Thought content:    WNL  Sensory/Perceptual disturbances:    WNL  Orientation:  oriented to person, place, time/date and situation  Attention:  Good  Concentration:  Good  Memory:  WNL  Fund of knowledge:   Good  Insight:    Good  Judgment:   Good  Impulse Control:  Good   Risk Assessment: Danger to Self:  No Self-injurious Behavior: No Danger to Others: No Duty to Warn:no Physical Aggression / Violence:No  Access to Firearms a concern: No  Gang Involvement:No   Subjective: Patient was present for session.  She shared that things were going better overall.  She made it through Christmas and it was a good thing.  She was able to set an appropriate limit with her mother and that was progress.  She did share she had a med change that triggered  Horrible nightmares but she seems better now.  She has also had a decrease in migraines.  Patient reported she is having issues with the thought of getting married.  Patient discussed the fact that she knows she wants to get married but is not sure why she is having the response that she is.  Did EMDR set on the issue of getting married, patient reported suds level 4, felt anxiety.  Patient was able to reduce those level to 2.  She was able to realize through the processing that there have been a couple issues with her boyfriend that need to be addressed before she is fully ready to move forward with their relationship.  Patient explained that he needs to get the issues with his ex-wife and her  cheating resolved as well as dealing with the custody agreement and child support issues.  Patient also explained he has said some things when he has been drinking that have been hurtful and that has to be addressed between the 2 of them and he needs to continue working on cutting back on his drinking.  Patient shared that overall she is looking forward to the relationship with him and is realizing that getting married does not get a change anything but she does want this to be her only marriage so that is what such a concern for her.  Patient was encouraged to continue utilizing her coping skills and to talk to her boyfriend about the things discussed in session.  Interventions: Solution-Oriented/Positive Psychology and Eye Movement Desensitization and Reprocessing (EMDR)  Diagnosis:   ICD-10-CM   1. Posttraumatic stress disorder  F43.10     Plan: Patient is to utilize CBT and coping skills to decrease emotions attached to triggered responses.  Patient is to discuss her concerns with her boyfriend and help work through a solution for those problems.  Patient is to continue working on diet and exercise to help decrease negative emotions. Long-term goal: Develop and implement effective coping skills to carry out normal responsibilities and participate constructively in relationships Short-term goal: Implement a regular rest exercise regimen as a stress release technique Identify and replace negative  self talk and catastrophisizing that is associated with past trauma and current stimulus triggers for anxiety   Lina Sayre, [facility]

## 2019-02-05 NOTE — Progress Notes (Signed)
Breanna Young [id number] 14-Mar-1980 39 y.o.  Virtual Visit via Telephone Note  I connected with pt on 02/05/19 at  1:30 PM EST by telephone and verified that I am speaking with the correct person using two identifiers.   I discussed the limitations, risks, security and privacy concerns of performing an evaluation and management service by telephone and the availability of in person appointments. I also discussed with the patient that there may be a patient responsible charge related to this service. The patient expressed understanding and agreed to proceed.   I discussed the assessment and treatment plan with the patient. The patient was provided an opportunity to ask questions and all were answered. The patient agreed with the plan and demonstrated an understanding of the instructions.   The patient was advised to call back or seek an in-person evaluation if the symptoms worsen or if the condition fails to improve as anticipated.  I provided 30 minutes of non-face-to-face time during this encounter.  The patient was located at home.  The provider was located at [place].   [name], PMHNP   Subjective:   Patient ID:  Breanna Young is a 39 y.o. (DOB 1980/03/03) female.  Chief Complaint:  Chief Complaint  Patient presents with  . Follow-up    h/o anxiety, depression, insomnia, migraines    HPI Breanna Young presents for follow-up of anxiety, depression, insomnia, and migraines. She reports that she has been doing very well. She reports improved sleep with Topamax. Reports that it has been easier to fall asleep. Had some fatigue for the first 1-1.5 weeks on Topamax and this has resolved and this may have been due to some vivid dreams. She reports that there was only one occasion where she thought she may be on the verge of migraines. She reports infrequent mild HA's, typically upon awakening. She reports that she is now awakening feeling rested and refreshed.  She  reports that anxiety is improving. She reports that she has had some anxiety in response to some situational stress, especially with clients bringing up stressful topics. She reports that anxiety has been manageable. Denies any panic s/s. Denies depressed mood. She reports that she has been exercising consistently and eating healthier. Reports that her appetite has been about the same. Energy and motivation have been good. Denies any concentration impairment or word finding difficulties. Denies SI.   "I haven't felt this good in a very long time."   Past Psychiatric Medication Trials: Viibryd- More effective at 40 mg dose and then had more "fogginess" and some sexual side effective. Not effective at 20 mg. Trintellix- Helpful for depression Zoloft-sexual side effects. Felt more "upbeat." Lexapro-adverse reaction Wellbutrin XL-increased anxiety and felt fuzzy headed Rexulti-affect of dulling, weight gain Trazodone Ativan Xanax Klonopin Doxazosin-dissociation BuSpar-adverse reaction BC Powder- Ineffective Excedrin Migraine Tizanidine- ineffective for migraines Propranolol Ubrelvy Topamax  Review of Systems:  Review of Systems  Musculoskeletal: Negative for gait problem.  Neurological: Negative for tremors and headaches.  Psychiatric/Behavioral:       Please refer to HPI   Has had endoscopy and barium swallowing test. Has upcoming procedure scheduled.  Medications: I have reviewed the patient's current medications.  Current Outpatient Medications  Medication Sig Dispense Refill  . CALCIUM-MAGNESIUM PO Take by mouth.    . Chromium 500 MCG TABS Take by mouth 2 (two) times daily.    . clonazePAM (KLONOPIN) 1 MG tablet TAKE 1 TABLET BY MOUTH TWICE A DAY THEN 1 TAB AS NEEDED FOR ANXIETY 75  tablet 1  . Glucosamine-Chondroitin (GLUCOSAMINE CHONDR COMPLEX PO) Take 3 tablets by mouth.    . Lactobacillus (PROBIOTIC ACIDOPHILUS PO) Take by mouth.    . lamoTRIgine (LAMICTAL) 150 MG  tablet Take 1 tablet (150 mg total) by mouth daily. 90 tablet 1  . levonorgestrel (MIRENA) 20 MCG/24HR IUD 1 each by Intrauterine route once.    . Multiple Vitamin (MULTIVITAMIN) tablet Take 1 tablet by mouth daily.    Marland Kitchen omega-3 fish oil (MAXEPA) 1000 MG CAPS capsule Take 2 capsules by mouth daily.    . pantoprazole (PROTONIX) 40 MG tablet Take 1 tablet (40 mg total) by mouth 2 (two) times daily. 180 tablet 3  . SUMAtriptan (IMITREX) 50 MG tablet TAKE 1 TAB EVERY 2 HRS AS NEEDED FOR MIGRAINE. MAY REPEAT IN 2 HOURS IF HEADACHE PERSISTS OR RECURS. 10 tablet 0  . tiZANidine (ZANAFLEX) 4 MG capsule Take 4 mg by mouth daily as needed for muscle spasms.    Marland Kitchen topiramate (TOPAMAX) 25 MG tablet Take 1 tablet (25 mg total) by mouth at bedtime. 90 tablet 1  . traZODone (DESYREL) 100 MG tablet Take 1 tablet (100 mg total) by mouth at bedtime. 90 tablet 1  . Vilazodone HCl (VIIBRYD) 20 MG TABS Take 1.5 tablets (30 mg total) by mouth daily. 135 tablet 1  . loratadine-pseudoephedrine (CLARITIN-D 24-HOUR) 10-240 MG 24 hr tablet Take 1 tablet by mouth daily.     No current facility-administered medications for this visit.    Medication Side Effects: None  Allergies:  Allergies  Allergen Reactions  . Avelox [Moxifloxacin Hcl In Nacl]   . Dexlansoprazole   . Doxazosin     Past Medical History:  Diagnosis Date  . GERD (gastroesophageal reflux disease)   . Hiatal hernia   . IBS (irritable bowel syndrome)     Family History  Problem Relation Age of Onset  . Depression Mother   . Heart attack Father   . Diabetes Father   . Depression Maternal Grandmother   . Endometrial cancer Maternal Grandmother   . Dementia Maternal Grandfather   . Diabetes Maternal Grandfather   . Dementia Paternal Grandmother     Social History   Socioeconomic History  . Marital status: Single    Spouse name: Not on file  . Number of children: Not on file  . Years of education: Not on file  . Highest education level:  Not on file  Occupational History  . Occupation: hair stylist  Tobacco Use  . Smoking status: Never Smoker  . Smokeless tobacco: Never Used  Substance and Sexual Activity  . Alcohol use: Not Currently    Alcohol/week: 2.0 standard drinks    Types: 2 Glasses of wine per week  . Drug use: Never  . Sexual activity: Yes    Partners: Male    Birth control/protection: I.U.D.  Other Topics Concern  . Not on file  Social History Narrative  . Not on file   Social Determinants of Health   Financial Resource Strain:   . Difficulty of Paying Living Expenses: Not on file  Food Insecurity:   . Worried About Charity fundraiser in the Last Year: Not on file  . Ran Out of Food in the Last Year: Not on file  Transportation Needs:   . Lack of Transportation (Medical): Not on file  . Lack of Transportation (Non-Medical): Not on file  Physical Activity:   . Days of Exercise per Week: Not on file  . Minutes of  Exercise per Session: Not on file  Stress:   . Feeling of Stress : Not on file  Social Connections:   . Frequency of Communication with Friends and Family: Not on file  . Frequency of Social Gatherings with Friends and Family: Not on file  . Attends Religious Services: Not on file  . Active Member of Clubs or Organizations: Not on file  . Attends Archivist Meetings: Not on file  . Marital Status: Not on file  Intimate Partner Violence:   . Fear of Current or Ex-Partner: Not on file  . Emotionally Abused: Not on file  . Physically Abused: Not on file  . Sexually Abused: Not on file    Past Medical History, Surgical history, Social history, and Family history were reviewed and updated as appropriate.   Please see review of systems for further details on the patient's review from today.   Objective:   Physical Exam:  Wt 139 lb (63 kg)   BMI 23.13 kg/m   Physical Exam Neurological:     Mental Status: She is alert and oriented to person, place, and time.      Cranial Nerves: No dysarthria.  Psychiatric:        Attention and Perception: Attention and perception normal.        Mood and Affect: Mood normal.        Speech: Speech normal.        Behavior: Behavior is cooperative.        Thought Content: Thought content normal. Thought content is not paranoid or delusional. Thought content does not include homicidal or suicidal ideation. Thought content does not include homicidal or suicidal plan.        Cognition and Memory: Cognition and memory normal.        Judgment: Judgment normal.     Comments: Insight intact     Lab Review:  No results found for: NA, K, CL, CO2, GLUCOSE, BUN, CREATININE, CALCIUM, PROT, ALBUMIN, AST, ALT, ALKPHOS, BILITOT, GFRNONAA, GFRAA  No results found for: WBC, RBC, HGB, HCT, PLT, MCV, MCH, MCHC, RDW, LYMPHSABS, MONOABS, EOSABS, BASOSABS  No results found for: POCLITH, LITHIUM   No results found for: PHENYTOIN, PHENOBARB, VALPROATE, CBMZ   .res Assessment: Plan:   Will continue current plan of care since target signs and symptoms are well controlled without any tolerability issues. Recommend continuing psychotherapy with Lina Sayre, [place]. Pt to f/u with this provider in 3 months or sooner if clinically indicated. Patient advised to contact office with any questions, adverse effects, or acute worsening in signs and symptoms.  Emilio was seen today for follow-up.  Diagnoses and all orders for this visit:  Recurrent major depressive disorder, in full remission ([place]) -     lamoTRIgine (LAMICTAL) 150 MG tablet; Take 1 tablet (150 mg total) by mouth daily. -     Vilazodone HCl (VIIBRYD) 20 MG TABS; Take 1.5 tablets (30 mg total) by mouth daily.  Intractable migraine with status migrainosus, unspecified migraine type -     topiramate (TOPAMAX) 25 MG tablet; Take 1 tablet (25 mg total) by mouth at bedtime.  Insomnia due to mental disorder -     topiramate (TOPAMAX) 25 MG tablet; Take 1 tablet (25 mg total) by  mouth at bedtime. -     traZODone (DESYREL) 100 MG tablet; Take 1 tablet (100 mg total) by mouth at bedtime.  PTSD (post-traumatic stress disorder) -     Vilazodone HCl (VIIBRYD) 20 MG TABS; Take  1.5 tablets (30 mg total) by mouth daily.  Generalized anxiety disorder -     Vilazodone HCl (VIIBRYD) 20 MG TABS; Take 1.5 tablets (30 mg total) by mouth daily.    Please see After Visit Summary for patient specific instructions.  Future Appointments  Date Time Provider [place]  02/14/2019  1:40 PM Lavena Bullion, DO [facility]  03/19/2019  1:00 PM Lina Sayre, [facility] None  05/07/2019 12:45 PM [name], PMHNP CP-CP None    No orders of the defined types were placed in this encounter.     -------------------------------

## 2019-02-12 ENCOUNTER — Ambulatory Visit: Payer: BC Managed Care – PPO | Admitting: Psychiatry

## 2019-02-14 ENCOUNTER — Ambulatory Visit: Payer: BC Managed Care – PPO | Admitting: Gastroenterology

## 2019-02-15 ENCOUNTER — Other Ambulatory Visit: Payer: Self-pay | Admitting: Psychiatry

## 2019-02-15 DIAGNOSIS — G43911 Migraine, unspecified, intractable, with status migrainosus: Secondary | ICD-10-CM

## 2019-02-18 ENCOUNTER — Other Ambulatory Visit: Payer: Self-pay | Admitting: Psychiatry

## 2019-02-18 DIAGNOSIS — F5105 Insomnia due to other mental disorder: Secondary | ICD-10-CM

## 2019-02-18 DIAGNOSIS — G43911 Migraine, unspecified, intractable, with status migrainosus: Secondary | ICD-10-CM

## 2019-03-05 ENCOUNTER — Ambulatory Visit: Payer: BC Managed Care – PPO | Admitting: Gastroenterology

## 2019-03-05 ENCOUNTER — Other Ambulatory Visit: Payer: Self-pay | Admitting: Gastroenterology

## 2019-03-05 ENCOUNTER — Other Ambulatory Visit: Payer: Self-pay

## 2019-03-05 ENCOUNTER — Encounter: Payer: Self-pay | Admitting: Gastroenterology

## 2019-03-05 VITALS — BP 110/66 | HR 93 | Temp 97.8°F | Ht 65.0 in | Wt 141.1 lb

## 2019-03-05 DIAGNOSIS — K219 Gastro-esophageal reflux disease without esophagitis: Secondary | ICD-10-CM

## 2019-03-05 NOTE — Patient Instructions (Signed)
You will be scheduled for a TIF procedure on 03/26/19  Please review the Post -Op TIF Diet    It was a pleasure to see you today!  Vito Cirigliano, D.O.

## 2019-03-05 NOTE — H&P (View-Only) (Signed)
P  Chief Complaint:   GERD, evaluation for TIF  Referring Physician: Scarlette Shorts, MD  HPI:    Breanna Young is a 39 year old female with a history of GAD, depression, insomnia, migraines, GERD, referred to me by Dr. Henrene Pastor for evaluation of possible antireflux intervention with Transoral Incisionless Fundoplication (TIF) with a goal to stop or significantly reduce acid suppression therapy.  She has a longstanding history of reflux for the last 15+ years, characterized by pyrosis and regurgitation.  Regurgitation wit forward flexion. All symptoms have been worsening lately.  Treated with pantoprazole which initially worked well, with decreasing efficacy over the last few months, with increasing regurgitation, particularly at night.  Additional nocturnal symptoms of coughing, choking. Reflux sxs worse with acidic foods, spicy, coffee, wine.  Avoids eating close to bedtime and sleeps with HOB elevated.  No dysphagia.  Pantoprazole increased to 40 mg bid with some improvement, but owing to ongoing symptoms requiring high-dose therapy, she is interested in antireflux surgical options.  GERD history: -Index symptoms: Pyrosis, regurgitation, nocturnal symptoms (coughing, choking) -Medications trialed: Pantoprazoe, omeprazole, Tums, Alka-Seltzer -Current medications: Pantoprazole 40 mg bid, Tums prn -Complications: None  GERD evaluation: -EGD (12/2018, Dr. Henrene Pastor): Normal esophagus, fundic gland polyps, normal duodenum -EGD approximately 10 years ago and was told she had a hiatal hernia.  No details available for review. -Barium esophagram (12/2018): Minimal reflux into distal esophagus.  Normal motility -Esophageal Manometry: N/A -pH/Impedance: N/A  GERD HRQL Questionnaire Score: 26/50 (on high-dose PPI) and marked "dissatisfied" with satisfaction of current health condition.  Separately, history of IBS mixed type which is largely controlled with dietary modifications/high-fiber  diet.   Review of systems:     No chest pain, no SOB, no fevers, no urinary sx   Past Medical History:  Diagnosis Date  . GERD (gastroesophageal reflux disease)   . Hiatal hernia   . IBS (irritable bowel syndrome)     Patient's surgical history, family medical history, social history, medications and allergies were all reviewed in Epic    Current Outpatient Medications  Medication Sig Dispense Refill  . CALCIUM-MAGNESIUM PO Take by mouth.    . Chromium 500 MCG TABS Take 1 tablet by mouth 2 (two) times daily.     . clonazePAM (KLONOPIN) 1 MG tablet TAKE 1 TABLET BY MOUTH TWICE A DAY THEN 1 TAB AS NEEDED FOR ANXIETY (Patient taking differently: 3 (three) times daily as needed. TAKE 1 TABLET BY MOUTH TWICE A DAY THEN 1 TAB AS NEEDED FOR ANXIETY) 75 tablet 1  . Lactobacillus (PROBIOTIC ACIDOPHILUS PO) Take 1 tablet by mouth daily.     Marland Kitchen lamoTRIgine (LAMICTAL) 150 MG tablet Take 1 tablet (150 mg total) by mouth daily. 90 tablet 1  . levonorgestrel (MIRENA) 20 MCG/24HR IUD 1 each by Intrauterine route once.    . Multiple Vitamin (MULTIVITAMIN) tablet Take 1 tablet by mouth daily.    Marland Kitchen omega-3 fish oil (MAXEPA) 1000 MG CAPS capsule Take 2 capsules by mouth daily.    . pantoprazole (PROTONIX) 40 MG tablet Take 1 tablet (40 mg total) by mouth 2 (two) times daily. 180 tablet 3  . topiramate (TOPAMAX) 25 MG tablet START 25 MG BY MOUTH AT BEDTIME FOR ONE WEEK, THEN MAY INCREASE TO 2 TABS BY MOUTH AT BEDTIME (Patient taking differently: Take 25 mg by mouth daily as needed. ) 60 tablet 0  . traZODone (DESYREL) 100 MG tablet Take 1 tablet (100 mg total) by mouth at bedtime. 90 tablet 1  .  Vilazodone HCl (VIIBRYD) 20 MG TABS Take 1.5 tablets (30 mg total) by mouth daily. 135 tablet 1  . loratadine-pseudoephedrine (CLARITIN-D 24-HOUR) 10-240 MG 24 hr tablet Take 1 tablet by mouth daily as needed.     . SUMAtriptan (IMITREX) 50 MG tablet TAKE 1 TAB EVERY 2 HRS AS NEEDED FOR MIGRAINE. MAY REPEAT IN 2  HOURS IF HEADACHE PERSISTS OR RECURS. (Patient not taking: Reported on 03/05/2019) 8 tablet 1  . tiZANidine (ZANAFLEX) 4 MG capsule Take 4 mg by mouth daily as needed for muscle spasms.     No current facility-administered medications for this visit.    Physical Exam:     BP 110/66   Pulse 93   Temp 97.8 F (36.6 C)   Ht 5\' 5"  (1.651 m)   Wt 141 lb 2 oz (64 kg)   BMI 23.48 kg/m   GENERAL:  Pleasant female in NAD PSYCH: : Cooperative, normal affect EENT:  conjunctiva pink, mucous membranes moist, neck supple without masses CARDIAC:  RRR, no murmur heard, no peripheral edema PULM: Normal respiratory effort, lungs CTA bilaterally, no wheezing ABDOMEN:  Nondistended, soft, nontender. No obvious masses, no hepatomegaly,  normal bowel sounds SKIN:  turgor, no lesions seen Musculoskeletal:  Normal muscle tone, normal strength NEURO: Alert and oriented x 3, no focal neurologic deficits   IMPRESSION and PLAN:    1) GERD Breanna Young is a 40 y.o. female with a long-standing history of GERD with suboptimal response to high-dose PPI therapy.  She is requesting antireflux surgery with goal of improved/resolved symptoms,  and stopping acid suppression medications. Discussed the pathophysiology of GERD at length, to include the risks of untreated reflux (ie, strictures, Barrett's Esophagus, EAC, etc) as well as the possible treatment with medications vs antireflux surgery. In particular, we discussed the risks, benefits, and alternatives of Transoral Incisionless Fundoplication (TIF), to include Nissen fundoplication and Linx, and the patient wishes to proceed with TIF.  -I reviewed her endoscopy images today.  Retroflexed view appears to have a small Hill grade 2 valve, but no significant hernia on anterograde or retroflexed images.  No significant hernia noted on esophagram -Objective evidence of reflux established on recent barium esophagram.  No further diagnostic evaluation necessary at  this time -Given suboptimal response to high-dose acid suppression therapy, described very bothersome regurgitation with nocturnal symptoms, would clearly benefit from antireflux surgery with valve restoration - Will schedule [facility] with plan for subsequent admission overnight for post-operative observation  - NPO at [place] prior to the procedure - Confirmed allergies with the patient and no allergies to planned perioperative antibiotics - Reviewed postoperative dietary and activity restrictions with patient and provided handout - Anticipated 23 hour stay  - Additional recommendations regarding medications and post-operative diet to follow TIF completion -Discussed the risks and benefits of TIF, to include bleeding, perforation, infection, sore throat, chest pain, etc. and she wishes to proceed with scheduling -Avoid topical anesthetics in postop setting due to anxiety with numbing medications - All questions answered  2) IBS mixed type -Well-controlled with dietary modifications alone.  On several medications which may also provide some neuromodulation benefit.  No change at this time.      I spent 45 minutes of time, including in depth chart review, independent review of results as outlined above, communicating results with the patient directly, face-to-face time with the patient, coordinating care, ordering studies and medications as appropriate, and documentation.    [place] ,DO, FACG  03/05/2019, 11:09 AM

## 2019-03-05 NOTE — Progress Notes (Signed)
P  Chief Complaint:   GERD, evaluation for TIF  Referring Physician: Scarlette Shorts, MD  HPI:    Breanna Young is a 39 year old female with a history of GAD, depression, insomnia, migraines, GERD, referred to me by Dr. Henrene Pastor for evaluation of possible antireflux intervention with Transoral Incisionless Fundoplication (TIF) with a goal to stop or significantly reduce acid suppression therapy.  She has a longstanding history of reflux for the last 15+ years, characterized by pyrosis and regurgitation.  Regurgitation wit forward flexion. All symptoms have been worsening lately.  Treated with pantoprazole which initially worked well, with decreasing efficacy over the last few months, with increasing regurgitation, particularly at night.  Additional nocturnal symptoms of coughing, choking. Reflux sxs worse with acidic foods, spicy, coffee, wine.  Avoids eating close to bedtime and sleeps with HOB elevated.  No dysphagia.  Pantoprazole increased to 40 mg bid with some improvement, but owing to ongoing symptoms requiring high-dose therapy, she is interested in antireflux surgical options.  GERD history: -Index symptoms: Pyrosis, regurgitation, nocturnal symptoms (coughing, choking) -Medications trialed: Pantoprazoe, omeprazole, Tums, Alka-Seltzer -Current medications: Pantoprazole 40 mg bid, Tums prn -Complications: None  GERD evaluation: -EGD (12/2018, Dr. Henrene Pastor): Normal esophagus, fundic gland polyps, normal duodenum -EGD approximately 10 years ago and was told she had a hiatal hernia.  No details available for review. -Barium esophagram (12/2018): Minimal reflux into distal esophagus.  Normal motility -Esophageal Manometry: N/A -pH/Impedance: N/A  GERD HRQL Questionnaire Score: 26/50 (on high-dose PPI) and marked "dissatisfied" with satisfaction of current health condition.  Separately, history of IBS mixed type which is largely controlled with dietary modifications/high-fiber  diet.   Review of systems:     No chest pain, no SOB, no fevers, no urinary sx   Past Medical History:  Diagnosis Date  . GERD (gastroesophageal reflux disease)   . Hiatal hernia   . IBS (irritable bowel syndrome)     Patient's surgical history, family medical history, social history, medications and allergies were all reviewed in Epic    Current Outpatient Medications  Medication Sig Dispense Refill  . CALCIUM-MAGNESIUM PO Take by mouth.    . Chromium 500 MCG TABS Take 1 tablet by mouth 2 (two) times daily.     . clonazePAM (KLONOPIN) 1 MG tablet TAKE 1 TABLET BY MOUTH TWICE A DAY THEN 1 TAB AS NEEDED FOR ANXIETY (Patient taking differently: 3 (three) times daily as needed. TAKE 1 TABLET BY MOUTH TWICE A DAY THEN 1 TAB AS NEEDED FOR ANXIETY) 75 tablet 1  . Lactobacillus (PROBIOTIC ACIDOPHILUS PO) Take 1 tablet by mouth daily.     Marland Kitchen lamoTRIgine (LAMICTAL) 150 MG tablet Take 1 tablet (150 mg total) by mouth daily. 90 tablet 1  . levonorgestrel (MIRENA) 20 MCG/24HR IUD 1 each by Intrauterine route once.    . Multiple Vitamin (MULTIVITAMIN) tablet Take 1 tablet by mouth daily.    Marland Kitchen omega-3 fish oil (MAXEPA) 1000 MG CAPS capsule Take 2 capsules by mouth daily.    . pantoprazole (PROTONIX) 40 MG tablet Take 1 tablet (40 mg total) by mouth 2 (two) times daily. 180 tablet 3  . topiramate (TOPAMAX) 25 MG tablet START 25 MG BY MOUTH AT BEDTIME FOR ONE WEEK, THEN MAY INCREASE TO 2 TABS BY MOUTH AT BEDTIME (Patient taking differently: Take 25 mg by mouth daily as needed. ) 60 tablet 0  . traZODone (DESYREL) 100 MG tablet Take 1 tablet (100 mg total) by mouth at bedtime. 90 tablet 1  .  Vilazodone HCl (VIIBRYD) 20 MG TABS Take 1.5 tablets (30 mg total) by mouth daily. 135 tablet 1  . loratadine-pseudoephedrine (CLARITIN-D 24-HOUR) 10-240 MG 24 hr tablet Take 1 tablet by mouth daily as needed.     . SUMAtriptan (IMITREX) 50 MG tablet TAKE 1 TAB EVERY 2 HRS AS NEEDED FOR MIGRAINE. MAY REPEAT IN 2  HOURS IF HEADACHE PERSISTS OR RECURS. (Patient not taking: Reported on 03/05/2019) 8 tablet 1  . tiZANidine (ZANAFLEX) 4 MG capsule Take 4 mg by mouth daily as needed for muscle spasms.     No current facility-administered medications for this visit.    Physical Exam:     BP 110/66   Pulse 93   Temp 97.8 F (36.6 C)   Ht 5\' 5"  (1.651 m)   Wt 141 lb 2 oz (64 kg)   BMI 23.48 kg/m   GENERAL:  Pleasant female in NAD PSYCH: : Cooperative, normal affect EENT:  conjunctiva pink, mucous membranes moist, neck supple without masses CARDIAC:  RRR, no murmur heard, no peripheral edema PULM: Normal respiratory effort, lungs CTA bilaterally, no wheezing ABDOMEN:  Nondistended, soft, nontender. No obvious masses, no hepatomegaly,  normal bowel sounds SKIN:  turgor, no lesions seen Musculoskeletal:  Normal muscle tone, normal strength NEURO: Alert and oriented x 3, no focal neurologic deficits   IMPRESSION and PLAN:    1) GERD Breanna Young is a 39 y.o. female with a long-standing history of GERD with suboptimal response to high-dose PPI therapy.  She is requesting antireflux surgery with goal of improved/resolved symptoms,  and stopping acid suppression medications. Discussed the pathophysiology of GERD at length, to include the risks of untreated reflux (ie, strictures, Barrett's Esophagus, EAC, etc) as well as the possible treatment with medications vs antireflux surgery. In particular, we discussed the risks, benefits, and alternatives of Transoral Incisionless Fundoplication (TIF), to include Nissen fundoplication and Linx, and the patient wishes to proceed with TIF.  -I reviewed her endoscopy images today.  Retroflexed view appears to have a small Hill grade 2 valve, but no significant hernia on anterograde or retroflexed images.  No significant hernia noted on esophagram -Objective evidence of reflux established on recent barium esophagram.  No further diagnostic evaluation necessary at  this time -Given suboptimal response to high-dose acid suppression therapy, described very bothersome regurgitation with nocturnal symptoms, would clearly benefit from antireflux surgery with valve restoration - Will schedule for TIF at [facility] with plan for subsequent admission overnight for post-operative observation  - NPO at [place] prior to the procedure - Confirmed allergies with the patient and no allergies to planned perioperative antibiotics - Reviewed postoperative dietary and activity restrictions with patient and provided handout - Anticipated 23 hour stay  - Additional recommendations regarding medications and post-operative diet to follow TIF completion -Discussed the risks and benefits of TIF, to include bleeding, perforation, infection, sore throat, chest pain, etc. and she wishes to proceed with scheduling -Avoid topical anesthetics in postop setting due to anxiety with numbing medications - All questions answered  2) IBS mixed type -Well-controlled with dietary modifications alone.  On several medications which may also provide some neuromodulation benefit.  No change at this time.      I spent 45 minutes of time, including in depth chart review, independent review of results as outlined above, communicating results with the patient directly, face-to-face time with the patient, coordinating care, ordering studies and medications as appropriate, and documentation.    [place] ,DO, FACG  03/05/2019, 11:09 AM

## 2019-03-19 ENCOUNTER — Ambulatory Visit: Payer: BC Managed Care – PPO | Admitting: Psychiatry

## 2019-03-20 ENCOUNTER — Encounter (HOSPITAL_COMMUNITY): Payer: Self-pay | Admitting: *Deleted

## 2019-03-21 ENCOUNTER — Telehealth: Payer: Self-pay | Admitting: Gastroenterology

## 2019-03-21 NOTE — Telephone Encounter (Signed)
I spoke with Dr. Burt Knack from [facility] as a peer-to-peer.  TIF currently denied by [facility], despite the fact that this is approved and multiple surrounding states' [employer].  He stated that next that would be for the patient to file an appeal.  Can we please assist Ms. Breanna Young in filing this appeal?

## 2019-03-21 NOTE — Telephone Encounter (Signed)
I spoke to patient this afternoon to let her know that Dr Lonna Cobb had a peer-to peer with Dr Burt Knack from [facility]. She was provided phone numbers to contact and request an appeal as she did not wasn't to wait for them to call her. Additional information was given to patient to file for an appeal in writing. She will call our office and let us know the status or if she needs further assistance.

## 2019-03-21 NOTE — Telephone Encounter (Signed)
Faxed clinicals to [employer] for an authorization to cover the TIF procedure scheduled on 03/26/2019.  [employer] denied the procedure on 03/20/2019 stating they consider this procedure "investigational".  For an appeal and peer to peer they can be contacted at [phone] ext [phone] and ref# [id number].

## 2019-03-24 ENCOUNTER — Other Ambulatory Visit (HOSPITAL_COMMUNITY)
Admission: RE | Admit: 2019-03-24 | Discharge: 2019-03-24 | Disposition: A | Discharge status: Home / Self Care | Payer: BC Managed Care – PPO | Source: Ambulatory Visit | Attending: Gastroenterology | Admitting: Gastroenterology

## 2019-03-24 DIAGNOSIS — Z01812 Encounter for preprocedural laboratory examination: Secondary | ICD-10-CM | POA: Diagnosis not present

## 2019-03-24 DIAGNOSIS — Z20822 Contact with and (suspected) exposure to covid-19: Secondary | ICD-10-CM | POA: Diagnosis not present

## 2019-03-25 ENCOUNTER — Telehealth: Payer: Self-pay | Admitting: Gastroenterology

## 2019-03-25 LAB — SARS CORONAVIRUS 2 (TAT 6-24 HRS): SARS Coronavirus 2: NEGATIVE

## 2019-03-25 NOTE — Telephone Encounter (Signed)
Patient is returning your call she asked that you leave her a detailed message. She also said that she will be seeing you tomorrow regardless of what her insurance has decided.

## 2019-03-25 NOTE — Telephone Encounter (Signed)
I spoke with Breanna Young, and plan is for her to proceed with TIF as planned tomorrow morning.  Discussed updates regarding [employer] BS.  We did discuss the possibility of extended recovery in the PACU with discharge home same day.  This is based on emerging literature of same day discharge after TIF being performed at several of the large volume facilities throughout the country on a case-by-case basis.  Will reevaluate in PACU for same-day discharge versus overnight admission for 23-hour observation.  All questions answered and appreciative of the call back.

## 2019-03-26 ENCOUNTER — Encounter (HOSPITAL_COMMUNITY): Payer: Self-pay | Admitting: Gastroenterology

## 2019-03-26 ENCOUNTER — Ambulatory Visit (HOSPITAL_COMMUNITY): Payer: BC Managed Care – PPO | Admitting: Certified Registered Nurse Anesthetist

## 2019-03-26 ENCOUNTER — Telehealth: Payer: Self-pay | Admitting: Gastroenterology

## 2019-03-26 ENCOUNTER — Ambulatory Visit (HOSPITAL_COMMUNITY)
Admission: RE | Admit: 2019-03-26 | Discharge: 2019-03-26 | Disposition: A | Discharge status: Home / Self Care | Payer: BC Managed Care – PPO | Attending: Gastroenterology | Admitting: Gastroenterology

## 2019-03-26 ENCOUNTER — Other Ambulatory Visit: Payer: Self-pay

## 2019-03-26 ENCOUNTER — Encounter (HOSPITAL_COMMUNITY)
Admission: RE | Disposition: A | Discharge status: Home / Self Care | Payer: Self-pay | Source: Home / Self Care | Attending: Gastroenterology

## 2019-03-26 DIAGNOSIS — Z79899 Other long term (current) drug therapy: Secondary | ICD-10-CM | POA: Diagnosis not present

## 2019-03-26 DIAGNOSIS — K317 Polyp of stomach and duodenum: Secondary | ICD-10-CM | POA: Diagnosis not present

## 2019-03-26 DIAGNOSIS — F411 Generalized anxiety disorder: Secondary | ICD-10-CM | POA: Diagnosis not present

## 2019-03-26 DIAGNOSIS — X58XXXA Exposure to other specified factors, initial encounter: Secondary | ICD-10-CM | POA: Diagnosis not present

## 2019-03-26 DIAGNOSIS — K219 Gastro-esophageal reflux disease without esophagitis: Secondary | ICD-10-CM | POA: Diagnosis not present

## 2019-03-26 DIAGNOSIS — Z975 Presence of (intrauterine) contraceptive device: Secondary | ICD-10-CM | POA: Diagnosis not present

## 2019-03-26 DIAGNOSIS — T182XXA Foreign body in stomach, initial encounter: Secondary | ICD-10-CM | POA: Diagnosis not present

## 2019-03-26 DIAGNOSIS — Z793 Long term (current) use of hormonal contraceptives: Secondary | ICD-10-CM | POA: Diagnosis not present

## 2019-03-26 DIAGNOSIS — K449 Diaphragmatic hernia without obstruction or gangrene: Secondary | ICD-10-CM | POA: Diagnosis not present

## 2019-03-26 DIAGNOSIS — F332 Major depressive disorder, recurrent severe without psychotic features: Secondary | ICD-10-CM | POA: Diagnosis not present

## 2019-03-26 DIAGNOSIS — G43909 Migraine, unspecified, not intractable, without status migrainosus: Secondary | ICD-10-CM | POA: Insufficient documentation

## 2019-03-26 DIAGNOSIS — G4700 Insomnia, unspecified: Secondary | ICD-10-CM | POA: Diagnosis not present

## 2019-03-26 HISTORY — PX: ESOPHAGEAL DILATION: SHX303

## 2019-03-26 HISTORY — PX: ESOPHAGOGASTRODUODENOSCOPY (EGD) WITH PROPOFOL: SHX5813

## 2019-03-26 HISTORY — PX: TRANSORAL INCISIONLESS FUNDOPLICATION: SHX6840

## 2019-03-26 LAB — PREGNANCY, URINE: Preg Test, Ur: NEGATIVE

## 2019-03-26 SURGERY — ESOPHAGOGASTRODUODENOSCOPY (EGD) WITH PROPOFOL
Anesthesia: General

## 2019-03-26 MED ORDER — PROPOFOL 10 MG/ML IV BOLUS
INTRAVENOUS | Status: DC | PRN
  Administered 2019-03-26: 40 mg via INTRAVENOUS
  Administered 2019-03-26: 160 mg via INTRAVENOUS

## 2019-03-26 MED ORDER — ACETAMINOPHEN 160 MG/5ML PO SOLN
1000.0000 mg | Freq: Once | ORAL | Status: DC | PRN
  Filled 2019-03-26: qty 40.6

## 2019-03-26 MED ORDER — LIDOCAINE HCL (CARDIAC) PF 100 MG/5ML IV SOSY
PREFILLED_SYRINGE | INTRAVENOUS | Status: DC | PRN
  Administered 2019-03-26: 60 mg via INTRATRACHEAL

## 2019-03-26 MED ORDER — OXYCODONE HCL 5 MG PO TABS: 5.0000 mg | ORAL_TABLET | Freq: Once | ORAL | Status: DC | PRN

## 2019-03-26 MED ORDER — FENTANYL CITRATE (PF) 100 MCG/2ML IJ SOLN
INTRAMUSCULAR | Status: AC
  Filled 2019-03-26: qty 4

## 2019-03-26 MED ORDER — MIDAZOLAM HCL 2 MG/2ML IJ SOLN
INTRAMUSCULAR | Status: AC
  Filled 2019-03-26: qty 2

## 2019-03-26 MED ORDER — FENTANYL CITRATE (PF) 100 MCG/2ML IJ SOLN
25.0000 ug | INTRAMUSCULAR | Status: DC | PRN
  Administered 2019-03-26 (×2): 50 ug via INTRAVENOUS

## 2019-03-26 MED ORDER — ONDANSETRON HCL 4 MG/2ML IJ SOLN
INTRAMUSCULAR | Status: DC | PRN
  Administered 2019-03-26: 4 mg via INTRAVENOUS

## 2019-03-26 MED ORDER — SUCCINYLCHOLINE CHLORIDE 20 MG/ML IJ SOLN
INTRAMUSCULAR | Status: DC | PRN
  Administered 2019-03-26: 80 mg via INTRAVENOUS

## 2019-03-26 MED ORDER — SIMETHICONE 80 MG PO CHEW: 80.0000 mg | CHEWABLE_TABLET | Freq: Four times a day (QID) | ORAL | 2 refills | Status: DC | PRN

## 2019-03-26 MED ORDER — DEXAMETHASONE SODIUM PHOSPHATE 10 MG/ML IJ SOLN
INTRAMUSCULAR | Status: DC | PRN
  Administered 2019-03-26: 10 mg via INTRAVENOUS

## 2019-03-26 MED ORDER — SODIUM CHLORIDE 0.9 % IV SOLN: INTRAVENOUS | Status: DC

## 2019-03-26 MED ORDER — ACETAMINOPHEN-CODEINE 120-12 MG/5ML PO SOLN: 15.0000 mL | ORAL | 0 refills | Status: AC | PRN

## 2019-03-26 MED ORDER — SUGAMMADEX SODIUM 200 MG/2ML IV SOLN
INTRAVENOUS | Status: DC | PRN
  Administered 2019-03-26: 200 mg via INTRAVENOUS

## 2019-03-26 MED ORDER — ROCURONIUM BROMIDE 10 MG/ML (PF) SYRINGE
PREFILLED_SYRINGE | INTRAVENOUS | Status: DC | PRN
  Administered 2019-03-26: 40 mg via INTRAVENOUS

## 2019-03-26 MED ORDER — PROPOFOL 10 MG/ML IV BOLUS
INTRAVENOUS | Status: AC
  Filled 2019-03-26: qty 20

## 2019-03-26 MED ORDER — LACTATED RINGERS IV SOLN
INTRAVENOUS | Status: DC
  Administered 2019-03-26: 1000 mL via INTRAVENOUS

## 2019-03-26 MED ORDER — ACETAMINOPHEN 10 MG/ML IV SOLN
1000.0000 mg | Freq: Once | INTRAVENOUS | Status: DC | PRN
  Administered 2019-03-26: 1000 mg via INTRAVENOUS
  Filled 2019-03-26: qty 100

## 2019-03-26 MED ORDER — OXYCODONE HCL 5 MG/5ML PO SOLN: 5.0000 mg | Freq: Once | ORAL | Status: DC | PRN

## 2019-03-26 MED ORDER — CEFAZOLIN SODIUM-DEXTROSE 2-4 GM/100ML-% IV SOLN
2.0000 g | Freq: Once | INTRAVENOUS | Status: AC
  Administered 2019-03-26: 2 g via INTRAVENOUS
  Filled 2019-03-26: qty 100

## 2019-03-26 MED ORDER — ONDANSETRON HCL 4 MG/2ML IJ SOLN
4.0000 mg | Freq: Once | INTRAMUSCULAR | Status: AC
  Administered 2019-03-26: 4 mg via INTRAVENOUS

## 2019-03-26 MED ORDER — PROMETHAZINE HCL 25 MG/ML IJ SOLN: 6.2500 mg | INTRAMUSCULAR | Status: DC | PRN

## 2019-03-26 MED ORDER — SCOPOLAMINE 1 MG/3DAYS TD PT72
1.0000 | MEDICATED_PATCH | Freq: Once | TRANSDERMAL | Status: DC
  Administered 2019-03-26: 1.5 mg via TRANSDERMAL

## 2019-03-26 MED ORDER — FENTANYL CITRATE (PF) 100 MCG/2ML IJ SOLN
INTRAMUSCULAR | Status: AC
  Filled 2019-03-26: qty 2

## 2019-03-26 MED ORDER — ACETAMINOPHEN 10 MG/ML IV SOLN
INTRAVENOUS | Status: AC
  Filled 2019-03-26: qty 100

## 2019-03-26 MED ORDER — METOCLOPRAMIDE HCL 5 MG/ML IJ SOLN
INTRAMUSCULAR | Status: DC | PRN
  Administered 2019-03-26: 5 mg via INTRAVENOUS

## 2019-03-26 MED ORDER — FENTANYL CITRATE (PF) 100 MCG/2ML IJ SOLN
INTRAMUSCULAR | Status: DC | PRN
  Administered 2019-03-26: 100 ug via INTRAVENOUS

## 2019-03-26 MED ORDER — ONDANSETRON HCL 4 MG PO TABS: 4.0000 mg | ORAL_TABLET | Freq: Every day | ORAL | 1 refills | Status: AC | PRN

## 2019-03-26 MED ORDER — ACETAMINOPHEN 500 MG PO TABS: 1000.0000 mg | ORAL_TABLET | Freq: Once | ORAL | Status: DC | PRN

## 2019-03-26 MED ORDER — ONDANSETRON HCL 4 MG/2ML IJ SOLN
INTRAMUSCULAR | Status: AC
  Filled 2019-03-26: qty 2

## 2019-03-26 MED ORDER — SCOPOLAMINE 1 MG/3DAYS TD PT72
MEDICATED_PATCH | TRANSDERMAL | Status: AC
  Filled 2019-03-26: qty 1

## 2019-03-26 MED ORDER — FAMOTIDINE IN NACL 20-0.9 MG/50ML-% IV SOLN
20.0000 mg | Freq: Once | INTRAVENOUS | Status: AC
  Administered 2019-03-26: 20 mg via INTRAVENOUS
  Filled 2019-03-26: qty 50

## 2019-03-26 MED ORDER — MIDAZOLAM HCL 5 MG/5ML IJ SOLN
INTRAMUSCULAR | Status: DC | PRN
  Administered 2019-03-26: 2 mg via INTRAVENOUS

## 2019-03-26 SURGICAL SUPPLY — 16 items
BLOCK BITE 60FR ADLT L/F BLUE (MISCELLANEOUS) ×4 IMPLANT
ELECT REM PT RETURN 9FT ADLT (ELECTROSURGICAL)
ELECTRODE REM PT RTRN 9FT ADLT (ELECTROSURGICAL) IMPLANT
FORCEP RJ3 GP 1.8X160 W-NEEDLE (CUTTING FORCEPS) IMPLANT
FORCEPS BIOP RAD 4 LRG CAP 4 (CUTTING FORCEPS) IMPLANT
NDL SCLEROTHERAPY 25GX240 (NEEDLE) IMPLANT
NEEDLE SCLEROTHERAPY 25GX240 (NEEDLE) IMPLANT
PROBE APC STR FIRE (PROBE) IMPLANT
PROBE INJECTION GOLD (MISCELLANEOUS)
PROBE INJECTION GOLD 7FR (MISCELLANEOUS) IMPLANT
SNARE SHORT THROW 13M SML OVAL (MISCELLANEOUS) IMPLANT
SYR 50ML LL SCALE MARK (SYRINGE) IMPLANT
SerosaFuse Endogastric Solutions ×2 IMPLANT
TUBING ENDO SMARTCAP PENTAX (MISCELLANEOUS) ×8 IMPLANT
TUBING IRRIGATION ENDOGATOR (MISCELLANEOUS) ×4 IMPLANT
WATER STERILE IRR 1000ML POUR (IV SOLUTION) IMPLANT

## 2019-03-26 NOTE — Interval H&P Note (Signed)
History and Physical Interval Note:  03/26/2019 7:44 AM  Breanna Young  has presented today for surgery, with the diagnosis of GERD.  The various methods of treatment have been discussed with the patient and family. After consideration of risks, benefits and other options for treatment, the patient has consented to  Procedure(s): ESOPHAGOGASTRODUODENOSCOPY (EGD) WITH ESOPHAGEAL DILATION AND TRANSORAL INCISIONLESS FUNDOPLICATION (N/A) (General Anesthesia) as a surgical intervention.  The patient's history has been reviewed, patient examined, no change in status, stable for surgery.  I have reviewed the patient's chart and labs.  Questions were answered to the patient's satisfaction.     Dominic Pea Favor Kreh

## 2019-03-26 NOTE — Transfer of Care (Addendum)
Immediate Anesthesia Transfer of Care Note  Patient: Breanna Young  Procedure(s) Performed: ESOPHAGOGASTRODUODENOSCOPY (EGD) WITH PROPOFOL (N/A ) TRANSORAL INCISIONLESS FUNDOPLICATION (N/A )  Patient Location: PACU  Anesthesia Type:General  Level of Consciousness: awake, alert  and oriented  Airway & Oxygen Therapy: Patient Spontanous Breathing and Patient connected to face mask oxygen  Post-op Assessment: Report given to RN and Post -op Vital signs reviewed and stable  Post vital signs: Reviewed and stable  Last Vitals:  Vitals Value Taken Time  BP    Temp    Pulse    Resp    SpO2      Last Pain:  Vitals:   03/26/19 0800  TempSrc: Oral  PainSc: 0-No pain         Complications: No apparent anesthesia complications

## 2019-03-26 NOTE — Discharge Instructions (Signed)
YOU HAD AN ENDOSCOPIC PROCEDURE TODAY: Refer to the procedure report and other information in the discharge instructions given to you for any specific questions about what was found during the examination. If this information does not answer your questions, please call [place] office at [phone] to clarify.  ° °YOU SHOULD EXPECT: Some feelings of bloating in the abdomen. Passage of more gas than usual. Walking can help get rid of the air that was put into your GI tract during the procedure and reduce the bloating. If you had a lower endoscopy (such as a colonoscopy or flexible sigmoidoscopy) you may notice spotting of blood in your stool or on the toilet paper. Some abdominal soreness may be present for a day or two, also. ° °DIET: Your first meal following the procedure should be a light meal and then it is ok to progress to your normal diet. A half-sandwich or bowl of soup is an example of a good first meal. Heavy or fried foods are harder to digest and may make you feel nauseous or bloated. Drink plenty of fluids but you should avoid alcoholic beverages for 24 hours. If you had a esophageal dilation, please see attached instructions for diet.   ° °ACTIVITY: Your care partner should take you home directly after the procedure. You should plan to take it easy, moving slowly for the rest of the day. You can resume normal activity the day after the procedure however YOU SHOULD NOT DRIVE, use power tools, machinery or perform tasks that involve climbing or major physical exertion for 24 hours (because of the sedation medicines used during the test).  ° °SYMPTOMS TO REPORT IMMEDIATELY: °A gastroenterologist can be reached at any hour. Please call [phone]  for any of the following symptoms:  °Following lower endoscopy (colonoscopy, flexible sigmoidoscopy) °Excessive amounts of blood in the stool  °Significant tenderness, worsening of abdominal pains  °Swelling of the abdomen that is new, acute  °Fever of 100° or  higher  °Following upper endoscopy (EGD, EUS, ERCP, esophageal dilation) °Vomiting of blood or coffee ground material  °New, significant abdominal pain  °New, significant chest pain or pain under the shoulder blades  °Painful or persistently difficult swallowing  °New shortness of breath  °Black, tarry-looking or red, bloody stools ° °FOLLOW UP:  °If any biopsies were taken you will be contacted by phone or by letter within the next 1-3 weeks. Call [phone]  if you have not heard about the biopsies in 3 weeks.  °Please also call with any specific questions about appointments or follow up tests. ° °

## 2019-03-26 NOTE — Anesthesia Procedure Notes (Signed)
Procedure Name: Intubation Date/Time: 03/26/2019 8:42 AM Performed by: [place] Marquan Vokes C, CRNA Pre-anesthesia Checklist: Patient identified, Emergency Drugs available, Suction available and Patient being monitored Patient Re-evaluated:Patient Re-evaluated prior to induction Oxygen Delivery Method: Circle system utilized Preoxygenation: Pre-oxygenation with 100% oxygen Induction Type: IV induction and Rapid sequence Laryngoscope Size: Mac and 3 Grade View: Grade II Tube type: Oral Tube size: 7.0 mm Number of attempts: 1 Airway Equipment and Method: Stylet and Oral airway Placement Confirmation: ETT inserted through vocal cords under direct vision,  positive ETCO2 and breath sounds checked- equal and bilateral Secured at: 21 cm Tube secured with: Tape Dental Injury: Teeth and Oropharynx as per pre-operative assessment

## 2019-03-26 NOTE — Op Note (Signed)
[facility] Patient Name: Breanna Young Procedure Date: 03/26/2019 MRN: [id number] Attending MD: Gerrit Heck , MD Date of Birth: July 15, 1980 CSN: [id number] Age: 39 Admit Type: Outpatient Procedure:                Upper GI endoscopy Indications:              For therapy of esophageal reflux                           39 y.o. female with a long-standing history of GERD                            with suboptimal response to high-dose PPI therapy,                            presents for Transoral Incisionless Fundoplication                            (TIF). Providers:                Gerrit Heck, MD, Glori Bickers, RN, Lina Sar, Technician, Elspeth Cho Tech.,                            Technician, Stephanie [place] CRNA Referring MD:              Medicines:                Monitored Anesthesia Care Complications:            No immediate complications. Estimated Blood Loss:     Estimated blood loss was minimal. Procedure:                Pre-Anesthesia Assessment:                           - Prior to the procedure, a History and Physical                            was performed, and patient medications and                            allergies were reviewed. The patient's tolerance of                            previous anesthesia was also reviewed. The risks                            and benefits of the procedure and the sedation                            options and risks were discussed with the patient.                            All questions were answered, and  informed consent                            was obtained. Prior Anticoagulants: The patient has                            taken no previous anticoagulant or antiplatelet                            agents. ASA Grade Assessment: II - A patient with                            mild systemic disease. After reviewing the risks                            and benefits, the  patient was deemed in                            satisfactory condition to undergo the procedure.                           After obtaining informed consent, the endoscope was                            passed under direct vision. Throughout the                            procedure, the patient's blood pressure, pulse, and                            oxygen saturations were monitored continuously. The                            [id number]-[id number] Olympus gastroscope was                            introduced through the mouth, and advanced to the                            duodenal bulb. The upper GI endoscopy was                            accomplished without difficulty. The TIF device was                            then prepared an inserted as outlined below. The                            initial device was advanced without difficulty, but                            the helical retractor was noted far outside it's  housing channel and bent. This was retracted back                            into the channel and the entire device removed. The                            endoscope was reinserted and the esophagus ans                            stomach were without any perforation or mucosal                            defect due to the malpositioned helical retractor.                            A new device was then prepared and inserted. The                            patient tolerated the procedure well. Scope In: Scope Out: Findings:      The upper third of the esophagus, middle third of the esophagus and       lower third of the esophagus were normal. The scope was withdrawn.       Dilation was performed with a Maloney dilator with mild resistance at 58       Fr. The dilation site was examined following endoscope reinsertion and       showed mild mucosal disruption at 18 cm, consistent with successful       dilation. Estimated blood loss was minimal.      The  Z-line was regular and was found 41 cm from the incisors.      The gastroesophageal flap valve was visualized endoscopically and       classified as Hill Grade II (fold present, opens with respiration). The       decision was made to perform transoral fundoplication with the EsophyX       Z+ system. Before the procedure, the gastroesophageal flap valve was       classified as Hill Grade II (fold present, opens with respiration). The       endoscope was withdrawn, placed through the plication device, reinserted       into the patient and advanced past the level of the GE junction at 41 cm       from the incisors and into the stomach. Next, the endoscope was advanced       beyond the device and retroflexed. The first plication site was       identified at the 11 o'clock position. With the device in the proper       position, the helical retractor was deployed and tissue was pulled into       the mold before it was closed. The device was rotated, suction was       applied using the invaginator, then the device was advanced slightly and       two H-shaped fasteners were placed. The device was reloaded and the       process repeated in order to deploy a total of six fasteners at the       first site. The device was then  rotated to the 1 o'clock position after       which the helical retractor was used to grasp additional tissue within       the mold before rotation and deployment of a total of six fasteners at       the second site. To complete reconstruction of the valve, additional       fasteners were deployed at the following sites: six more fasteners at 1       o'clock position, four fasteners at 5 o'clock position, 4 fasteners at 7       o'clock position. In total, 26 fasteners contributed to create a valve       measuring 3 cm in length which involved 270 degrees of the circumference       upon retroflexed view. The EsophyX device and endoscope were then       removed. Relook endoscopy was  performed prior to the conclusion of the       case to confirm the above findings. Estimated blood loss was minimal.      A large amount of food (residue) was found in the gastric fundus and in       the gastric body. Removal of food was accomplished with Jabier Mutton net prior       to the start of the fundoplication.      A few small sessile polyps with no stigmata of recent bleeding were       found in the gastric fundus and in the gastric body. These were not       resected.      The duodenal bulb was normal. Impression:               - Normal upper third of esophagus, middle third of                            esophagus and lower third of esophagus. Dilated.                           - Z-line regular, 41 cm from the incisors.                           - Gastroesophageal flap valve classified as Hill                            Grade II (fold present, opens with respiration).                           - A large amount of food (residue) in the stomach.                            Removal was successful.                           - A few gastric polyps.                           - Normal duodenal bulb.                           - EsophyX transoral fundoplication was  performed. Moderate Sedation:      Not Applicable - Patient had care per Anesthesia. Recommendation:           -Transfer to PACU for possible same day discharge.                           -Zofran 4 mg IV now.                           -Reglan 10 mg now.                           -Resume scopolamine patch x3 days (applied preop)                           -Protonix 40 mg p.o. BID x2 weeks, then 40 mg daily                            x2 weeks, then 20 mg daily x1 week then prn                           -Gas-X (simethicone) 4225 mg p.o. prn every 6 hours                            gas pain, abdominal discomfort                           -Tylenol 3 (APAP 120 mg/codeine 12 mg per 5 mL): 15                            mL's every 4 hours prn  pain                           -Colace 100 mg p.o. twice daily if taking pain                            medications                           -Clear liquid diet x24 hours, then will advvance to                            full liquids x24 hours, then slowly advance to                            pureed diet.                           -Okay to ambulate with assist as tolerated in PACU.                           -Please do not hesitate to contact me directly with                            any  postoperative questions or concerns Procedure Code(s):        --- Professional ---                           [phone], Esophagogastroduodenoscopy, flexible,                            transoral; with removal of foreign body(s)                           43450, Dilation of esophagus, by unguided sound or                            bougie, single or multiple passes Diagnosis Code(s):        --- Professional ---                           [id number], Foreign body in stomach, initial encounter                           K31.7, Polyp of stomach and duodenum                           K21.9, Gastro-esophageal reflux disease without                            esophagitis CPT copyright 2019 [employer]. All rights reserved. The codes documented in this report are preliminary and upon coder review may  be revised to meet current compliance requirements. Gerrit Heck, MD 03/26/2019 10:28:21 AM Number of Addenda: 0

## 2019-03-26 NOTE — Telephone Encounter (Signed)
Pt has had N/V following TIF. Pain currently not controlled. They were under the impression that Zofran was qd.  Advised Zofran 4 mg po qid as needed. Take tylenol/codeine as prescribed starting 1 hour after next Zofran dose. Follow all instructions provided by Dr. Bryan Lemma. If pain or N/V not easily controlled call back or go to [facility] ED.

## 2019-03-26 NOTE — Anesthesia Preprocedure Evaluation (Signed)
Anesthesia Evaluation  Patient identified by MRN, date of birth, ID band Patient awake    Reviewed: Allergy & Precautions, NPO status , Patient's Chart, lab work & pertinent test results  History of Anesthesia Complications Negative for: history of anesthetic complications  Airway Mallampati: I  TM Distance: >3 FB Neck ROM: Full    Dental  (+) Dental Advisory Given, Teeth Intact   Pulmonary neg pulmonary ROS, neg recent URI,    breath sounds clear to auscultation       Cardiovascular negative cardio ROS   Rhythm:Regular     Neuro/Psych PSYCHIATRIC DISORDERS Anxiety Depression negative neurological ROS     GI/Hepatic Neg liver ROS, hiatal hernia, GERD  ,  Endo/Other  negative endocrine ROS  Renal/GU negative Renal ROS     Musculoskeletal negative musculoskeletal ROS (+)   Abdominal   Peds  Hematology negative hematology ROS (+)   Anesthesia Other Findings   Reproductive/Obstetrics                             Anesthesia Physical Anesthesia Plan  ASA: II  Anesthesia Plan: General   Post-op Pain Management:    Induction: Intravenous  PONV Risk Score and Plan: 3 and Ondansetron, Dexamethasone and Scopolamine patch - Pre-op  Airway Management Planned: Oral ETT  Additional Equipment: None  Intra-op Plan:   Post-operative Plan: Extubation in OR  Informed Consent: I have reviewed the patients History and Physical, chart, labs and discussed the procedure including the risks, benefits and alternatives for the proposed anesthesia with the patient or authorized representative who has indicated his/her understanding and acceptance.     Dental advisory given  Plan Discussed with: CRNA and Surgeon  Anesthesia Plan Comments:         Anesthesia Quick Evaluation

## 2019-03-27 ENCOUNTER — Encounter: Payer: Self-pay | Admitting: *Deleted

## 2019-03-27 ENCOUNTER — Telehealth: Payer: Self-pay | Admitting: Gastroenterology

## 2019-03-27 MED ORDER — SUCRALFATE 1 GM/10ML PO SUSP: 1.0000 g | Freq: Four times a day (QID) | ORAL | 1 refills | Status: DC

## 2019-03-27 MED ORDER — PROMETHAZINE HCL 25 MG PO TABS: 25.0000 mg | ORAL_TABLET | Freq: Four times a day (QID) | ORAL | 0 refills | Status: AC | PRN

## 2019-03-27 NOTE — Telephone Encounter (Signed)
Thank you Norberto Sorenson. I talked to her this morning as well and will keep close tabs. Appreciate you helping her out last night and keeping me in the loop.

## 2019-03-27 NOTE — Telephone Encounter (Signed)
Sore throat and pain following TIF yesterday. Likely related to esophageal mucosal rent and surgical site. No SOB, cough, fever, chills. 5-6 episodes of emesis. No n/v this AM. Tolerating water, but hasnt advanced beyond. Will treat as follows:  - Phenergan 25 mg PO prn Q6 hours - Carafate 10 mL QID - Resume Zofran. Tylenol #3, PPI, as prescribed.  To call with any fever, chills, return of n/vor any other concerns. All questions answered and appreciative of call back.

## 2019-03-28 ENCOUNTER — Telehealth: Payer: Self-pay | Admitting: Gastroenterology

## 2019-03-28 MED ORDER — DEXAMETHASONE 4 MG PO TABS: 4.0000 mg | ORAL_TABLET | Freq: Two times a day (BID) | ORAL | 0 refills | Status: DC

## 2019-03-28 MED ORDER — OXYCODONE-ACETAMINOPHEN 10-325 MG PO TABS: 1.0000 | ORAL_TABLET | ORAL | 0 refills | Status: AC | PRN

## 2019-03-28 NOTE — Telephone Encounter (Signed)
Patient called requesting liquid oxycodone.   She was given Rx for Codeine and subsequently was given additional Rx for Oxycodone 10mg  this morning  I offered Lidocaine suspension, patient declined.   Advised patient to take the oxycodone tablets with applesauce or dissolve it in small amount of liquid to make a slurry.    She was asking " if I get the oxycodone now, will I be able to fill more prescription on Monday"  I decline additional Rx for Oxycodone liquid. Will need to come to ER for evaluation if symptoms significantly worse.

## 2019-03-28 NOTE — Telephone Encounter (Signed)
Throat and chest/pain-requesting to have a refill on the Acetaminophen/Codeine solution-reports she is not able to take a "really good death breath because it catches and it hurts"; -this medication "has been helping me to sleep but it does not take the pain away"-  Reports when she had her tonsils out they prescribed her Prednisone for a "decrease in inflammation as it helped then  because my throat feels the same way as it did then"; requesting a RX be sent;   Reports she started taking the Carafate due to the "knife like pain when I swallow"-it is helping a little, does not last long  Please advise

## 2019-03-28 NOTE — Telephone Encounter (Signed)
Still with odynophagia. Tolerating clears, but with pain with swallowing. Sharp pain in chest which can radiate to shoulder blades. No cough. No SOB, but pain with deep inhalation.   Has taken APAP/Codeine which improves pain, but short duration improvement only.    No fever, chills.   Still highest suspicion for pain related to mucosal rent noted during procedure, which is not uncommon, along with some expected post operative pain. Discussed some other post op etiologies for sxs, and will proceed as below. Did discuss return precautions, and she feels ok remaining at home with medical management and observation.   - Change pain meds from APAP #3 to Oxycodone/APAP 10 mg/325 mg prn every 4 hours as needed for pain, #30, RF0. Discontinue APAP #3. - Resume Carafate, PPI - Good data for the use of steroids to decrease post operative swelling and enhance recovery times. Will give short course of Decadron 4 mg BID x3 days, then 4 mg daily x3 days, then stop. - Discussed signs that should prompt her to call on-call GI or return to the ER, to include increasing pain, decreasing PO tolerance, fever, chills, SOB, or any other concern. If returning to ER, plan for CT Chest/abdomen, labs, etc.   All questions answered and appreciative of call back and advice. Will continue to monitor closely.

## 2019-03-30 NOTE — Anesthesia Postprocedure Evaluation (Signed)
Anesthesia Post Note  Patient: Breanna Young  Procedure(s) Performed: ESOPHAGOGASTRODUODENOSCOPY (EGD) WITH PROPOFOL (N/A ) TRANSORAL INCISIONLESS FUNDOPLICATION (N/A ) ESOPHAGEAL DILATION     Patient location during evaluation: PACU Anesthesia Type: General Level of consciousness: awake and alert Pain management: pain level controlled Vital Signs Assessment: post-procedure vital signs reviewed and stable Respiratory status: spontaneous breathing, nonlabored ventilation, respiratory function stable and patient connected to nasal cannula oxygen Cardiovascular status: blood pressure returned to baseline and stable Postop Assessment: no apparent nausea or vomiting Anesthetic complications: no    Last Vitals:  Vitals:   03/26/19 1105 03/26/19 1215  BP: (!) 145/90 138/85  Pulse: 75 73  Resp: 16 18  Temp: 36.7 C 36.6 C  SpO2: 96% 98%    Last Pain:  Vitals:   03/26/19 1105  TempSrc:   PainSc: 2                  Jupiter Kabir

## 2019-03-31 ENCOUNTER — Telehealth: Payer: Self-pay | Admitting: Gastroenterology

## 2019-03-31 MED ORDER — HYDROCODONE-ACETAMINOPHEN 7.5-325 MG/15ML PO SOLN: 15.0000 mL | ORAL | 0 refills | Status: AC | PRN

## 2019-03-31 NOTE — Telephone Encounter (Signed)
Was intolerant of the pain tablets due to odynophagia. Changing to Hydrocodone/APAP 7.5/325 mg Elixir. Confirmed availability at pharmacy. Tolerating broth without issue. No fever, chills, n/v. Tolerating Carafate and PPI and Decadron. Will discard other opioid at pharmacy when picking up new Rx.  All questions answered.

## 2019-04-08 ENCOUNTER — Encounter: Payer: Self-pay | Admitting: Gastroenterology

## 2019-04-08 ENCOUNTER — Other Ambulatory Visit: Payer: Self-pay

## 2019-04-08 ENCOUNTER — Ambulatory Visit: Payer: BC Managed Care – PPO | Admitting: Gastroenterology

## 2019-04-08 VITALS — BP 110/76 | HR 93 | Temp 97.5°F | Ht 65.0 in | Wt 130.0 lb

## 2019-04-08 DIAGNOSIS — R1310 Dysphagia, unspecified: Secondary | ICD-10-CM

## 2019-04-08 DIAGNOSIS — Z9889 Other specified postprocedural states: Secondary | ICD-10-CM | POA: Diagnosis not present

## 2019-04-08 DIAGNOSIS — K219 Gastro-esophageal reflux disease without esophagitis: Secondary | ICD-10-CM

## 2019-04-08 MED ORDER — METOCLOPRAMIDE HCL 5 MG PO TABS: 5.0000 mg | ORAL_TABLET | Freq: Three times a day (TID) | ORAL | 2 refills | Status: DC

## 2019-04-08 NOTE — Patient Instructions (Signed)
Call us in 2 weeks to report how you are doing  We have sent the following medications to your pharmacy for you to pick up at your convenience:  It was a pleasure to see you today!  Vito Cirigliano, D.O.

## 2019-04-08 NOTE — Progress Notes (Signed)
P  Chief Complaint:    Postop follow-up after TIF  GI History: Breanna Young is a 39 year old female with a history of GAD, depression, insomnia, migraines, along with a longstanding history GERD, referred to me by Dr. Henrene Pastor for Transoral Incisionless Fundoplication (TIF) with a goal to stop or significantly reduce acid suppression therapy.  TIF completed on 03/26/2019.  She has a longstanding history of reflux for the last 15+ years, with index symptoms of pyrosis, regurgitation, particularly regurgitation with forward flexion, along with nocturnal symptoms of coughing and choking. Treated with pantoprazole which initially worked well, with decreasing efficacy over time, with increasing regurgitation, particularly at night.  Reflux sxs worse with acidic foods, spicy, coffee, wine.   No dysphagia.    GERD history: -Index symptoms: Pyrosis, regurgitation, nocturnal symptoms (coughing, choking) -Medications trialed: Pantoprazoe, omeprazole, Tums, Alka-Seltzer -Current medications: Pantoprazole 40 mg bid, Tums prn -Complications: None  GERD evaluation: -EGD with TIF (03/26/2019, Dr. Bryan Lemma): Mucosal rent to 18 cm with 54 Fr Maloney dilator consistent with successful dilation pre-TIF.  Successful TIF with 26 Serofuse fasteners placed with 3 cm valve, 270 degrees -EGD (12/2018, Dr. Henrene Pastor): Normal esophagus, fundic gland polyps, normal duodenum -EGD approximately 10 years ago and was told she had a hiatal hernia.  No details available for review. -Barium esophagram (12/2018): Minimal reflux into distal esophagus.  Normal motility -Esophageal Manometry: N/A -pH/Impedance: N/A  GERD HRQL Questionnaire Score: 26/50 (on high-dose PPI) and marked "dissatisfied" with satisfaction of current health condition (preop, 03/05/2019).  Separately, history of IBS mixed type which is largely controlled with dietary modifications/high-fiber diet  HPI:     Patient is a 39 y.o. female presenting  [facility] for follow-up after TIF on 03/26/2019.  Postop course complicated by odynophagia/chest pain, requiring pain medications for 7 days.  Pain suspected due to mucosal rent at UES with dilator prior to performing TIF.  Has slowly increased p.o. tolerance, but still only sticking to liquids.  Still taking Protonix 40 mg bid as prescribed.    Endorses HB starting in AM and can occur thorughout the day, but symptoms are different from her index reflux symptoms, more described as pain in the MEG and suprasternal notch. No regurgitation. +nausea occasional without emesis.  Tolerating all medications as prescribed.  No fever, chills, SOB.  Is concerned about advancing diet.  Review of systems:     No chest pain, no SOB, no fevers, no urinary sx   Past Medical History:  Diagnosis Date  . GERD (gastroesophageal reflux disease)   . Hiatal hernia   . IBS (irritable bowel syndrome)     Patient's surgical history, family medical history, social history, medications and allergies were all reviewed in Epic    Current Outpatient Medications  Medication Sig Dispense Refill  . Calcium Citrate (CAL-CITRATE PO) Take 1 tablet by mouth daily with supper.    . Chromium 500 MCG TABS Take 500 mcg by mouth 2 (two) times daily.     . clonazePAM (KLONOPIN) 1 MG tablet TAKE 1 TABLET BY MOUTH TWICE A DAY THEN 1 TAB AS NEEDED FOR ANXIETY (Patient taking differently: Take 1 mg by mouth 3 (three) times daily as needed for anxiety. ) 75 tablet 1  . COLLAGEN PO Take 1 Scoop by mouth daily. Mix in morning w/juice    . Lactobacillus (PROBIOTIC ACIDOPHILUS PO) Take 1 tablet by mouth daily.     Marland Kitchen lamoTRIgine (LAMICTAL) 150 MG tablet Take 1 tablet (150 mg total) by  mouth daily. 90 tablet 1  . levonorgestrel (MIRENA) 20 MCG/24HR IUD 1 each by Intrauterine route once.    . loratadine-pseudoephedrine (CLARITIN-D 24-HOUR) 10-240 MG 24 hr tablet Take 1 tablet by mouth daily as needed for allergies.     Marland Kitchen  MAGNESIUM CITRATE PO Take 1 tablet by mouth daily with supper.    . Misc Natural Products (SUPER GREENS PO) Take 1 Scoop by mouth daily with breakfast. Mix with juice    . Multiple Vitamin (MULTIVITAMIN WITH MINERALS) TABS tablet Take 1 tablet by mouth daily.    . Omega-3 Fatty Acids (SUPER OMEGA 3 PO) Take 2 capsules by mouth daily.    . pantoprazole (PROTONIX) 40 MG tablet Take 1 tablet (40 mg total) by mouth 2 (two) times daily. 180 tablet 3  . Probiotic Product (PROBIOTIC PO) Take 1 capsule by mouth daily. [place]    . simethicone (GAS-X) 80 MG chewable tablet Chew 1 tablet (80 mg total) by mouth 4 (four) times daily as needed for flatulence. 100 tablet 2  . sucralfate (CARAFATE) 1 GM/10ML suspension Take 10 mLs (1 g total) by mouth 4 (four) times daily. 420 mL 1  . SUMAtriptan (IMITREX) 50 MG tablet TAKE 1 TAB EVERY 2 HRS AS NEEDED FOR MIGRAINE. MAY REPEAT IN 2 HOURS IF HEADACHE PERSISTS OR RECURS. (Patient taking differently: Take 50 mg by mouth every 2 (two) hours as needed for migraine. ) 8 tablet 1  . tiZANidine (ZANAFLEX) 4 MG tablet Take 4 mg by mouth every 6 (six) hours as needed (spasms.).     Marland Kitchen topiramate (TOPAMAX) 25 MG tablet START 25 MG BY MOUTH AT BEDTIME FOR ONE WEEK, THEN MAY INCREASE TO 2 TABS BY MOUTH AT BEDTIME (Patient taking differently: Take 25 mg by mouth at bedtime. ) 60 tablet 0  . traZODone (DESYREL) 100 MG tablet Take 1 tablet (100 mg total) by mouth at bedtime. 90 tablet 1  . Vilazodone HCl (VIIBRYD) 20 MG TABS Take 1.5 tablets (30 mg total) by mouth daily. 135 tablet 1  . ondansetron (ZOFRAN) 4 MG tablet Take 1 tablet (4 mg total) by mouth daily as needed for nausea or vomiting. (Patient not taking: Reported on 04/08/2019) 30 tablet 1  . promethazine (PHENERGAN) 25 MG tablet Take 1 tablet (25 mg total) by mouth every 6 (six) hours as needed for nausea or vomiting. (Patient not taking: Reported on 04/08/2019) 30 tablet 0   No current  facility-administered medications for this visit.    Physical Exam:     BP 110/76   Pulse 93   Temp (!) 97.5 F (36.4 C)   Ht 5\' 5"  (1.651 m)   Wt 130 lb (59 kg)   BMI 21.63 kg/m   GENERAL:  Pleasant female in NAD PSYCH: : Cooperative, normal affect EENT:  conjunctiva pink, mucous membranes moist, neck supple without masses.  No crepitus CARDIAC:  RRR, no murmur heard, no peripheral edema PULM: Normal respiratory effort, lungs CTA bilaterally, no wheezing ABDOMEN:  Nondistended, soft, nontender. No obvious masses, no hepatomegaly,  normal bowel sounds SKIN:  turgor, no lesions seen Musculoskeletal:  Normal muscle tone, normal strength NEURO: Alert and oriented x 3, no focal neurologic deficits   IMPRESSION and PLAN:    1) GERD 2) s/p Transoral Incisionless fundoplication 3) MEG Pain 4) Odynophagia 5) Nausea without emesis  39 year old female with longstanding history of reflux refractory to high-dose PPI therapy, now s/p TIF on 03/26/2019.  Postop course complicated  by odynophagia and chest pain, suspected due to mucosal rent of the esophagus.  No clinical evidence of perforation and no crepitus on exam.  Tolerating liquids.  Not sure if the symptoms she describes are truly reflux versus expected (albeit delayed and more pronounced) postoperative pain.  However, as the symptoms seem to be different from her index reflux, quite hopeful for the latter which should improve with time.  -Resume Protonix 40 mg bid for another 2 weeks, then if symptoms improving, start to wean PPI -Resume Carafate as prescribed for now, again with plan to wean in the next 2-4 weeks as possible -Trial course of Reglan for promotility benefit to reduce some of the nausea -Resume additional non-GI medications, a few of which have neuromodulation benefit -Okay to start to advance diet as tolerated -Continue advancing activity as tolerated -2 weeks phone f/u  I spent 35 minutes of time, including in  depth chart review, independent review of results as outlined above, communicating results with the patient directly, face-to-face time with the patient, coordinating care, and ordering studies and medications as appropriate, and documentation.            [place] ,DO, FACG 04/08/2019, 11:04 AM

## 2019-04-14 ENCOUNTER — Other Ambulatory Visit: Payer: Self-pay | Admitting: Psychiatry

## 2019-04-14 ENCOUNTER — Telehealth: Payer: Self-pay | Admitting: Gastroenterology

## 2019-04-14 DIAGNOSIS — K219 Gastro-esophageal reflux disease without esophagitis: Secondary | ICD-10-CM

## 2019-04-14 DIAGNOSIS — G43911 Migraine, unspecified, intractable, with status migrainosus: Secondary | ICD-10-CM

## 2019-04-14 NOTE — Telephone Encounter (Signed)
The symptoms could be from delayed esophageal clearance as the lower esophagus continues to heal after recent TIF.  Hopefully this does not represent suture failure which could have been related to the postop nausea/vomiting.  Plan to treat as follows:  -Pepcid 20 mg qhs.  #60, RF 1

## 2019-04-14 NOTE — Telephone Encounter (Signed)
Patient calling into the office-patient reports she is "choking in her sleep-it really started happening a day or two after I had my follow up OV"; "It did not feel like acid reflux- I really did not think anything of it when I was having the episodes- but it has become more and more since the OV;  Patient reports she is needing to "figure this out because it is scaring me because I don't always know it is happening-like in my sleep" Patient reports no changes in diet or activity Please advise

## 2019-04-15 MED ORDER — FAMOTIDINE 20 MG PO TABS: 20.0000 mg | ORAL_TABLET | Freq: Every day | ORAL | 1 refills | Status: DC

## 2019-04-15 NOTE — Telephone Encounter (Signed)
Left message for patient to call back to the office; RX sent to pharmacy listed in chart;

## 2019-04-15 NOTE — Telephone Encounter (Signed)
Patient returned your call.

## 2019-04-17 ENCOUNTER — Other Ambulatory Visit: Payer: Self-pay | Admitting: Psychiatry

## 2019-04-17 DIAGNOSIS — F4310 Post-traumatic stress disorder, unspecified: Secondary | ICD-10-CM

## 2019-04-17 DIAGNOSIS — F411 Generalized anxiety disorder: Secondary | ICD-10-CM

## 2019-04-25 ENCOUNTER — Telehealth: Payer: Self-pay | Admitting: Gastroenterology

## 2019-04-28 NOTE — Telephone Encounter (Signed)
She was last seen in the office by me on 3/23, and given issues with recovery after TIF, we elected to "reset the clock" on her timetable at that time. With that said, now would be a great time to start to wean the PPI and advance diet/exercise as follows:  - Decrease Protonix to 40 mg daily x2 weeks, then 20 mg daily x1 week, then as needed - Ok to start light workouts now as tolerated as follows:     - Yes, avoid lifting >5 lbs this week, and if feeling ok, no more than 25 lbs the next 2 weeks, then ok to continue.      - Ok to start running, cycling, etc.      - For core muscle exercises, ok to start now, but keep it light to start, then slowly advance as tolerated.      - Hold off on squats or similar exercises that increase intraabdominal pressure for another week. If doing ok with the above exercise introduction, then ok to resume those as well next week. - The nausea is likely still related to the surgery and should wean with time. We will keep an eye on this - Ok to start taking super greens and other OTCs now. If not tolerating, then start a fiber supplement such as Benefiber or Citrucel - The overall healing times are quite variable in the literature. While the majority feel better at this juncture and are reintroducing foods, activities, etc, most will also still endorse some level of discomfort 3 months out as the body continues to feel. Only a minority of patients have those same sxs 6 months out.

## 2019-04-28 NOTE — Telephone Encounter (Signed)
Patient returned call to the office- patient has picked up RX from pharmacy; Patient advised to call back to the office at [phone] should questions/concerns arise;  Patient verbalized understanding of information/instructions;

## 2019-04-28 NOTE — Telephone Encounter (Signed)
Left message for patient to call back to the office;  

## 2019-04-28 NOTE — Telephone Encounter (Signed)
Patient returned call to the office- patient reports she is wanting to know what she can take now for her constipation since she is not "allowed to take my super greens";   Patient reports she has had this issue before and used a laxative suppository - she is "not to a point where I am hurting to go to the bathroom but I don't want to take anything that would cause harm to this procedure";  Patient reports she has seen better results   Patient is requesting to know: 1) when can I start "light workouts"-not talking about lifting weights just "more than walking"-crunches? Exercise ball? workout DVD's (no weights)?  2)when can I start weaning off of acid reflux meds?  3) there are some mornings that I wake up and I am feeling nauseated-almost like someone has "sucker punched me in the stomach"-cause/plan of tx?   4)how long does the "healing process take" -around/about time frame--curious?  Please advise

## 2019-04-29 NOTE — Telephone Encounter (Signed)
Called and spoke with patient-patient informed of MD recommendations; patient is agreeable with plan of care and reports she will call the office should the need arise or questions arise; Patient verbalized understanding of information/instructions;  Patient was advised to call the office at [phone] if questions/concerns arise;

## 2019-04-30 ENCOUNTER — Ambulatory Visit (INDEPENDENT_AMBULATORY_CARE_PROVIDER_SITE_OTHER): Payer: BC Managed Care – PPO | Admitting: Psychiatry

## 2019-04-30 ENCOUNTER — Other Ambulatory Visit: Payer: Self-pay

## 2019-04-30 DIAGNOSIS — F4310 Post-traumatic stress disorder, unspecified: Secondary | ICD-10-CM | POA: Diagnosis not present

## 2019-04-30 NOTE — Progress Notes (Signed)
      [facility] Counselor/Therapist Progress Note  Patient ID: Breanna Young, MRN: [id number],    Date: 04/30/2019  Time Spent: 48 minutes start time 1:10 PM end time 1:58 PM  Treatment Type: Individual Therapy  Reported Symptoms: anxiety, triggered responses  Mental Status Exam:  Appearance:   Well Groomed     Behavior:  Sharing  Motor:  Normal  Speech/Language:   Normal Rate  Affect:  Appropriate  Mood:  normal  Thought process:  normal  Thought content:    WNL  Sensory/Perceptual disturbances:    WNL  Orientation:  oriented to person, place, time/date and situation  Attention:  Good  Concentration:  Good  Memory:  WNL  Fund of knowledge:   Good  Insight:    Good  Judgment:   Good  Impulse Control:  Good   Risk Assessment: Danger to Self:  No Self-injurious Behavior: No Danger to Others: No Duty to Warn:no Physical Aggression / Violence:No  Access to Firearms a concern: No  Gang Involvement:No   Subjective: Patient was present for session.  She shared she that she had surgery due to her acid reflex. She has lost a lot of weight due to her diet since the surgery.  She has had a difficult recovery but is getting through it.  Patient went on to explain that to surgery may or may not have been covered by her insurance but she did not feel like she had a choice because she was starting to choke on her food.  Patient shared she is working hard to try and figure out how to pay for the surgery and heal  herself.  Patient shared that during this time she and her mother had an issue but she was able to set an appropriate limit and since then her mother has been treating her more appropriately.  Patient also reported that she is been having more contact with her father and that has been a positive thing as well and helping her move in a good direction.  Patient shared that her boyfriend was extremely supportive during the surgery but since then has started having some issues  physically.  She is hoping that he will be okay and be able to grow in a positive direction through what is going on with him.  The importance of her continuing her progress and continuing to set limits with her mother and reminding herself that she is enough on a regular basis was discussed with patient.  She agreed to push appointment out for 2 months since she is doing so well currently.  Interventions: Solution-Oriented/Positive Psychology  Diagnosis:   ICD-10-CM   1. Posttraumatic stress disorder  F43.10     Plan: Patient is to use coping skills to continue to decrease her triggered emotions.  Patient is to continue setting appropriate limits with her mother.  Patient is to work on self-care and healing. Long-term goal: Develop and implement effective coping skills to carry out normal responsibilities and participate constructively in relationships Short-term goal: Implement a regular exercise regimen as a stress release technique: Identify and replace negative self talk and catastrophic sizing that is associated with the past trauma and current stimulus triggers for anxiety  Lina Sayre, [facility]

## 2019-05-01 NOTE — Telephone Encounter (Signed)
Patient is calling with additional questions 

## 2019-05-01 NOTE — Telephone Encounter (Signed)
Called and spoke with patient-RN answered all patient questions at this time; Patient advised to call back to the office at [phone] should questions/concerns arise;  Patient verbalized understanding of information/instructions;

## 2019-05-07 ENCOUNTER — Other Ambulatory Visit: Payer: Self-pay

## 2019-05-07 ENCOUNTER — Ambulatory Visit (INDEPENDENT_AMBULATORY_CARE_PROVIDER_SITE_OTHER): Payer: BC Managed Care – PPO | Admitting: Psychiatry

## 2019-05-07 ENCOUNTER — Encounter: Payer: Self-pay | Admitting: Psychiatry

## 2019-05-07 DIAGNOSIS — F5105 Insomnia due to other mental disorder: Secondary | ICD-10-CM | POA: Diagnosis not present

## 2019-05-07 DIAGNOSIS — F3342 Major depressive disorder, recurrent, in full remission: Secondary | ICD-10-CM

## 2019-05-07 DIAGNOSIS — F4310 Post-traumatic stress disorder, unspecified: Secondary | ICD-10-CM

## 2019-05-07 DIAGNOSIS — G43911 Migraine, unspecified, intractable, with status migrainosus: Secondary | ICD-10-CM

## 2019-05-07 DIAGNOSIS — F411 Generalized anxiety disorder: Secondary | ICD-10-CM

## 2019-05-07 MED ORDER — CLONAZEPAM 1 MG PO TABS: ORAL_TABLET | ORAL | 2 refills | Status: DC

## 2019-05-07 MED ORDER — LAMOTRIGINE 150 MG PO TABS: 150.0000 mg | ORAL_TABLET | Freq: Every day | ORAL | 1 refills | Status: DC

## 2019-05-07 MED ORDER — TOPIRAMATE 25 MG PO TABS: 25.0000 mg | ORAL_TABLET | Freq: Every day | ORAL | 1 refills | Status: DC

## 2019-05-07 MED ORDER — TRAZODONE HCL 100 MG PO TABS: 100.0000 mg | ORAL_TABLET | Freq: Every day | ORAL | 1 refills | Status: DC

## 2019-05-07 MED ORDER — VIIBRYD 20 MG PO TABS: 30.0000 mg | ORAL_TABLET | Freq: Every day | ORAL | 1 refills | Status: DC

## 2019-05-07 NOTE — Progress Notes (Signed)
Gurtie Bialy SU:[id number] 1980-03-30 39 y.o.  Subjective:   Patient ID:  Breanna Young is a 39 y.o. (DOB [birth year]) female.  Chief Complaint:  Chief Complaint  Patient presents with  . Follow-up    h/o anxiety, depression, insomnia    HPI Lakendria Karlberg presents to the office today for follow-up of mood, anxiety, and insomnia.   Had surgery 6 weeks ago for acid reflux and hiatal hernia repair. She reports that she has been having some difficulty with eating solid foods. Eating mostly liquids and some soft foods, such as yogurt and eggs.  Lost 13 lbs in 2 weeks. She reports that she is starting to have some appetite.   She reports that she has had some increase in anxiety and depression with recent health issues. She reports that she has had some nausea and tightness in esophagus and upper GI tract with increased stress. Denies panic attacks. Has had some increased worry. She reports that sleep has been disrupted due to GI s/s. She reports that this is improving some. Has some fatigue when sleep has been disrupted. Motivation has been ok. Concentration has been ok except for some difficulty when she is more tired. Denies SI.   Reports that she has been taking Klonopin sublingually.    Past Psychiatric Medication Trials: Viibryd- More effective at 40 mg dose and then had more "fogginess" and some sexual side effective. Not effective at 20 mg. Trintellix- Helpful for depression Zoloft-sexual side effects. Felt more "upbeat." Lexapro-adverse reaction Wellbutrin XL-increased anxiety and felt fuzzy headed Rexulti-affect of dulling, weight gain Trazodone Ativan Xanax Klonopin Doxazosin-dissociation BuSpar-adverse reaction BC Powder- Ineffective Excedrin Migraine Tizanidine- ineffective for migraines Propranolol Ubrelvy Topamax  Review of Systems:  Review of Systems  Respiratory: Positive for choking.   Gastrointestinal: Positive for constipation and nausea. Negative for  vomiting.  Musculoskeletal: Negative for gait problem.  Neurological: Negative for tremors.  Psychiatric/Behavioral:       Please refer to HPI    Medications: I have reviewed the patient's current medications.  Current Outpatient Medications  Medication Sig Dispense Refill  . [START ON 05/15/2019] clonazePAM (KLONOPIN) 1 MG tablet TAKE 1 TABLET BY MOUTH TWICE A DAY THEN 1 TAB AS NEEDED FOR ANXIETY 75 tablet 2  . famotidine (PEPCID) 20 MG tablet Take 1 tablet (20 mg total) by mouth at bedtime. 60 tablet 1  . Misc Natural Products (SUPER GREENS PO) Take 1 Scoop by mouth daily with breakfast. Mix with juice    . pantoprazole (PROTONIX) 40 MG tablet Take 1 tablet (40 mg total) by mouth 2 (two) times daily. (Patient taking differently: Take 40 mg by mouth daily. ) 180 tablet 3  . simethicone (GAS-X) 80 MG chewable tablet Chew 1 tablet (80 mg total) by mouth 4 (four) times daily as needed for flatulence. 100 tablet 2  . SUMAtriptan (IMITREX) 50 MG tablet TAKE 1 TAB EVERY 2 HRS AS NEEDED FOR MIGRAINE. MAY REPEAT IN 2 HOURS IF HEADACHE PERSISTS OR RECURS. 8 tablet 1  . topiramate (TOPAMAX) 25 MG tablet Take 1 tablet (25 mg total) by mouth at bedtime. 90 tablet 1  . Calcium Citrate (CAL-CITRATE PO) Take 1 tablet by mouth daily with supper.    . Chromium 500 MCG TABS Take 500 mcg by mouth 2 (two) times daily.     . COLLAGEN PO Take 1 Scoop by mouth daily. Mix in morning w/juice    . Lactobacillus (PROBIOTIC ACIDOPHILUS PO) Take 1 tablet by mouth daily.     Marland Kitchen  lamoTRIgine (LAMICTAL) 150 MG tablet Take 1 tablet (150 mg total) by mouth daily. 90 tablet 1  . levonorgestrel (MIRENA) 20 MCG/24HR IUD 1 each by Intrauterine route once.    . loratadine-pseudoephedrine (CLARITIN-D 24-HOUR) 10-240 MG 24 hr tablet Take 1 tablet by mouth daily as needed for allergies.     Marland Kitchen MAGNESIUM CITRATE PO Take 1 tablet by mouth daily with supper.    . metoCLOPramide (REGLAN) 5 MG tablet Take 1 tablet (5 mg total) by mouth 3  (three) times daily for 14 days. 60 tablet 2  . Multiple Vitamin (MULTIVITAMIN WITH MINERALS) TABS tablet Take 1 tablet by mouth daily.    . Omega-3 Fatty Acids (SUPER OMEGA 3 PO) Take 2 capsules by mouth daily.    . ondansetron (ZOFRAN) 4 MG tablet Take 1 tablet (4 mg total) by mouth daily as needed for nausea or vomiting. (Patient not taking: Reported on 04/08/2019) 30 tablet 1  . Probiotic Product (PROBIOTIC PO) Take 1 capsule by mouth daily. [place]    . promethazine (PHENERGAN) 25 MG tablet Take 1 tablet (25 mg total) by mouth every 6 (six) hours as needed for nausea or vomiting. (Patient not taking: Reported on 04/08/2019) 30 tablet 0  . sucralfate (CARAFATE) 1 GM/10ML suspension Take 10 mLs (1 g total) by mouth 4 (four) times daily. (Patient not taking: Reported on 05/07/2019) 420 mL 1  . tiZANidine (ZANAFLEX) 4 MG tablet Take 4 mg by mouth every 6 (six) hours as needed (spasms.).     Marland Kitchen traZODone (DESYREL) 100 MG tablet Take 1 tablet (100 mg total) by mouth at bedtime. 90 tablet 1  . Vilazodone HCl (VIIBRYD) 20 MG TABS Take 1.5 tablets (30 mg total) by mouth daily. 135 tablet 1   No current facility-administered medications for this visit.    Medication Side Effects: None  Allergies:  Allergies  Allergen Reactions  . Aluminum-Containing Compounds     Rash/itchy  . Avelox [Moxifloxacin Hcl In Nacl] Other (See Comments)    Unknown reaction type (possible rash reaction)   . Dexlansoprazole Other (See Comments)    Unknown reaction type (possible rash reaction)  . Doxazosin Other (See Comments)    Dizziness/faint    Past Medical History:  Diagnosis Date  . GERD (gastroesophageal reflux disease)   . Hiatal hernia   . IBS (irritable bowel syndrome)     Family History  Problem Relation Age of Onset  . Depression Mother   . Diabetes Mother   . GER disease Mother        said she had it for a long time  . Heart attack Father   . Diabetes Father    . Depression Maternal Grandmother   . Endometrial cancer Maternal Grandmother   . Dementia Maternal Grandfather   . Diabetes Maternal Grandfather   . Dementia Paternal Grandmother   . Colon cancer Neg Hx   . Breast cancer Neg Hx   . Esophageal cancer Neg Hx     Social History   Socioeconomic History  . Marital status: Single    Spouse name: Not on file  . Number of children: Not on file  . Years of education: Not on file  . Highest education level: Not on file  Occupational History  . Occupation: hair stylist  Tobacco Use  . Smoking status: Never Smoker  . Smokeless tobacco: Never Used  Substance and Sexual Activity  . Alcohol use: Not Currently    Alcohol/week: 2.0  standard drinks    Types: 2 Glasses of wine per week  . Drug use: Never  . Sexual activity: Yes    Partners: Male    Birth control/protection: I.U.D.  Other Topics Concern  . Not on file  Social History Narrative  . Not on file   Social Determinants of Health   Financial Resource Strain:   . Difficulty of Paying Living Expenses:   Food Insecurity:   . Worried About Charity fundraiser in the Last Year:   . Arboriculturist in the Last Year:   Transportation Needs:   . Film/video editor (Medical):   Marland Kitchen Lack of Transportation (Non-Medical):   Physical Activity:   . Days of Exercise per Week:   . Minutes of Exercise per Session:   Stress:   . Feeling of Stress :   Social Connections:   . Frequency of Communication with Friends and Family:   . Frequency of Social Gatherings with Friends and Family:   . Attends Religious Services:   . Active Member of Clubs or Organizations:   . Attends Archivist Meetings:   Marland Kitchen Marital Status:   Intimate Partner Violence:   . Fear of Current or Ex-Partner:   . Emotionally Abused:   Marland Kitchen Physically Abused:   . Sexually Abused:     Past Medical History, Surgical history, Social history, and Family history were reviewed and updated as appropriate.    Please see review of systems for further details on the patient's review from today.   Objective:   Physical Exam:  Wt 127 lb (57.6 kg)   BMI 21.13 kg/m   Physical Exam Constitutional:      General: She is not in acute distress. Musculoskeletal:        General: No deformity.  Neurological:     Mental Status: She is alert and oriented to person, place, and time.     Coordination: Coordination normal.  Psychiatric:        Attention and Perception: Attention and perception normal. She does not perceive auditory or visual hallucinations.        Mood and Affect: Affect is not labile, blunt, angry or inappropriate.        Speech: Speech normal.        Behavior: Behavior normal.        Thought Content: Thought content normal. Thought content is not paranoid or delusional. Thought content does not include homicidal or suicidal ideation. Thought content does not include homicidal or suicidal plan.        Cognition and Memory: Cognition and memory normal.        Judgment: Judgment normal.     Comments: Insight intact Mood is appropriate to content.  Affect is congruent.     Lab Review:  No results found for: NA, K, CL, CO2, GLUCOSE, BUN, CREATININE, CALCIUM, PROT, ALBUMIN, AST, ALT, ALKPHOS, BILITOT, GFRNONAA, GFRAA  No results found for: WBC, RBC, HGB, HCT, PLT, MCV, MCH, MCHC, RDW, LYMPHSABS, MONOABS, EOSABS, BASOSABS  No results found for: POCLITH, LITHIUM   No results found for: PHENYTOIN, PHENOBARB, VALPROATE, CBMZ   .res Assessment: Plan:   Patient seen for 30 minutes and time spent counseling patient regarding possible options if she were having difficulty with p.o. medications after surgery, to include changing to orally disintegrating tabs.  Patient reports that she is currently taking medications without difficulty at this time and will notify clinic if this changes. Agree with patient that some recent  increased sadness and anxiety seem to be situational and manageable  at this time.  Encourage patient to contact office if the signs and symptoms worsen. Encouraged patient to follow-up with GI provider regarding her questions about medications to improve constipation that would not be contraindicated after recent surgery. Will continue current plan of care without any changes at this time. Patient to follow-up with this provider in 3 months or sooner if clinically indicated. Patient advised to contact office with any questions, adverse effects, or acute worsening in signs and symptoms.  Thuong was seen today for follow-up.  Diagnoses and all orders for this visit:  PTSD (post-traumatic stress disorder) -     clonazePAM (KLONOPIN) 1 MG tablet; TAKE 1 TABLET BY MOUTH TWICE A DAY THEN 1 TAB AS NEEDED FOR ANXIETY -     Vilazodone HCl (VIIBRYD) 20 MG TABS; Take 1.5 tablets (30 mg total) by mouth daily.  Generalized anxiety disorder -     clonazePAM (KLONOPIN) 1 MG tablet; TAKE 1 TABLET BY MOUTH TWICE A DAY THEN 1 TAB AS NEEDED FOR ANXIETY -     Vilazodone HCl (VIIBRYD) 20 MG TABS; Take 1.5 tablets (30 mg total) by mouth daily.  Recurrent major depressive disorder, in full remission (HCC) -     Vilazodone HCl (VIIBRYD) 20 MG TABS; Take 1.5 tablets (30 mg total) by mouth daily. -     lamoTRIgine (LAMICTAL) 150 MG tablet; Take 1 tablet (150 mg total) by mouth daily.  Insomnia due to mental disorder -     traZODone (DESYREL) 100 MG tablet; Take 1 tablet (100 mg total) by mouth at bedtime. -     topiramate (TOPAMAX) 25 MG tablet; Take 1 tablet (25 mg total) by mouth at bedtime.  Intractable migraine with status migrainosus, unspecified migraine type -     topiramate (TOPAMAX) 25 MG tablet; Take 1 tablet (25 mg total) by mouth at bedtime.     Please see After Visit Summary for patient specific instructions.  Future Appointments  Date Time Provider [place]  08/06/2019 12:45 PM [name], PMHNP CP-CP None  08/13/2019  1:00 PM Lina Sayre,  [facility] None    No orders of the defined types were placed in this encounter.   -------------------------------

## 2019-05-28 ENCOUNTER — Telehealth: Payer: Self-pay | Admitting: Gastroenterology

## 2019-05-28 DIAGNOSIS — K219 Gastro-esophageal reflux disease without esophagitis: Secondary | ICD-10-CM

## 2019-05-28 MED ORDER — METOCLOPRAMIDE HCL 10 MG PO TABS: 10.0000 mg | ORAL_TABLET | Freq: Three times a day (TID) | ORAL | 0 refills | Status: DC

## 2019-05-28 NOTE — Telephone Encounter (Signed)
Pt reported that she is experiencing choking during sleep and pain at TIF procedure site.  Please advise.

## 2019-05-28 NOTE — Telephone Encounter (Signed)
Theres a possibility this represents breakthrough reflux, related to the post-op nausea/vomiting. Let's plan for the following:  - Barium esophagram - Reglan 10 mg pre-meals TID and qhs x2 weeks - Resume PPI for now - Schedule f/u in clinic with me

## 2019-05-28 NOTE — Telephone Encounter (Signed)
Patient says she is still having issues at night with feeling like she is choking like she was before. Patient says this happens a couple times a week. Patient says she's doing everything she is suppose to but she is still stuck in stage 3. She feels extremely nauseated when she tries different food also has pain when she tries anything new. Also is expiercing constipation and is burping up her food.

## 2019-05-28 NOTE — Telephone Encounter (Signed)
Patient is scheduled for Barium Swallow on 06/11/19 at 9:30am. Prescription has been sent to patients pharmacy and follow up appointment scheduled.

## 2019-06-02 ENCOUNTER — Other Ambulatory Visit: Payer: Self-pay | Admitting: Psychiatry

## 2019-06-02 DIAGNOSIS — G43911 Migraine, unspecified, intractable, with status migrainosus: Secondary | ICD-10-CM

## 2019-06-11 ENCOUNTER — Other Ambulatory Visit: Payer: Self-pay

## 2019-06-11 ENCOUNTER — Ambulatory Visit (HOSPITAL_COMMUNITY)
Admission: RE | Admit: 2019-06-11 | Discharge: 2019-06-11 | Disposition: A | Discharge status: Home / Self Care | Payer: BC Managed Care – PPO | Source: Ambulatory Visit | Attending: Gastroenterology | Admitting: Gastroenterology

## 2019-06-11 DIAGNOSIS — K219 Gastro-esophageal reflux disease without esophagitis: Secondary | ICD-10-CM | POA: Diagnosis not present

## 2019-06-17 ENCOUNTER — Telehealth: Payer: Self-pay | Admitting: Gastroenterology

## 2019-06-17 NOTE — Telephone Encounter (Signed)
Patient called is having a lot of nausea along with pain has not been feeling herself please advise

## 2019-06-17 NOTE — Telephone Encounter (Signed)
LMOM for patient to call back.

## 2019-06-17 NOTE — Telephone Encounter (Signed)
Spoke to patient who continues to complain of nausea and chest pain. She describes her symptoms as almost just as  severe  then prior to her TIF procedure. She is only tolerating bone broth at this time. She was not able to continue taking Reglan as she experienced night terrors while taking it. She  takes Lamictal daily and feels there may be an interaction between the two medications. An earlier appointment has been scheduled for patient  on 06/19/19 to discus next plan of care to help manage her symptoms.

## 2019-06-19 ENCOUNTER — Ambulatory Visit (INDEPENDENT_AMBULATORY_CARE_PROVIDER_SITE_OTHER): Payer: BC Managed Care – PPO | Admitting: Gastroenterology

## 2019-06-19 ENCOUNTER — Other Ambulatory Visit: Payer: Self-pay

## 2019-06-19 ENCOUNTER — Encounter: Payer: Self-pay | Admitting: Gastroenterology

## 2019-06-19 VITALS — BP 98/70 | HR 88 | Temp 97.7°F | Ht 65.0 in | Wt 126.5 lb

## 2019-06-19 DIAGNOSIS — Z9889 Other specified postprocedural states: Secondary | ICD-10-CM | POA: Diagnosis not present

## 2019-06-19 DIAGNOSIS — R1013 Epigastric pain: Secondary | ICD-10-CM | POA: Diagnosis not present

## 2019-06-19 DIAGNOSIS — R0789 Other chest pain: Secondary | ICD-10-CM | POA: Diagnosis not present

## 2019-06-19 DIAGNOSIS — R110 Nausea: Secondary | ICD-10-CM

## 2019-06-19 NOTE — Patient Instructions (Signed)
Stop Pepcid  It was a pleasure to see you today!  Vito Cirigliano, D.O.

## 2019-06-19 NOTE — Progress Notes (Signed)
P  Chief Complaint:    Weight loss, decreased p.o. tolerance, epigastric pain  GI History: Madicyn Secrease is a 39 year old femalewith a history of GAD, depression, insomnia, migraines, along with a longstanding history GERD,referred to me byDr. Perryfor Transoral Incisionless Fundoplication (TIF) with a goal to stop or significantly reduce acid suppression therapy.  TIF completed on 03/26/2019.  She has a longstanding history of reflux for the last 15+ years, with index symptoms of pyrosis, regurgitation, particularly regurgitation with forward flexion, along with nocturnal symptoms of coughing and choking. Treated with pantoprazole which initially worked well, with decreasing efficacy over time, with increasing regurgitation, particularly at night. Reflux sxs worse with acidic foods, spicy, coffee, wine.No dysphagia.   GERD history: -Index symptoms:Pyrosis, regurgitation, nocturnal symptoms (coughing, choking) -Medications trialed:Pantoprazoe, omeprazole, Tums, Alka-Seltzer -Current medications:Pantoprazole 40 mgbid, Tums prn -Complications:None  GERD evaluation: -EGD with TIF (03/26/2019, Dr. Bryan Lemma): Mucosal rent to 18 cm with 54 Fr Maloney dilator consistent with successful dilation pre-TIF.  Successful TIF with 26 Serofuse fasteners placed with 3 cm valve, 270 degrees -EGD(12/2018, Dr. Perry):Normal esophagus, fundic gland polyps, normal duodenum -EGD approximately 10 years ago and was told she had a hiatal hernia. No details available for review. -Barium esophagram(12/2018):Minimal reflux into distal esophagus. Normal motility -Esophageal Manometry:N/A -pH/Impedance:N/A  GERD HRQLQuestionnaire Score: 26/50 (on high-dose PPI) and marked "dissatisfied"with satisfaction of current health condition (preop, 03/05/2019).  -Barium esophagram (06/11/2019): Appropriate fundoplication defect seen at the [place].  Normal motility, 13 mm barium tablet passed easily  into the stomach.  No esophageal reflux noted.  Separately, history of IBS mixed type which is largely controlled with dietary modifications/high-fiber diet  HPI:     Patient is a 39 y.o. female presenting to the Gastroenterology Clinic for follow-up.  Last seen by me on 04/08/2019.  Was only slowly increasing p.o. intake at that time and was still taking Protonix 40 mg bid following TIF on 03/26/2019.  Due to ongoing symptoms, and given concern for fundoplication wrap disruption due to postoperative nausea/vomiting, she was evaluated with upper GI series last week.  I reviewed those images in detail and discussed with the Radiologist.  The wrap seems to be in good position and intact, and no refluxate was demonstrated.  Today, she states she is still fearful to advance her diet.  Does have intermittent epigastric pain.  Some nausea but no vomiting.  Sharp pain in MEG described as sharp, stabbing pain that can radiate up through chest. Nocturnal choking and some throat clearing still occurs intermittently. Tried advancing diet but has reverted back to bone broth and protein shakes to to post prandial MEG/chest pain 15 mins later, and can last to the next day.  Still taking Protonix 40 mg/day and Pepcid in the evening.   Review of systems:     No chest pain, no SOB, no fevers, no urinary sx   Past Medical History:  Diagnosis Date  . GERD (gastroesophageal reflux disease)   . Hiatal hernia   . IBS (irritable bowel syndrome)     Patient's surgical history, family medical history, social history, medications and allergies were all reviewed in Epic    Current Outpatient Medications  Medication Sig Dispense Refill  . clonazePAM (KLONOPIN) 1 MG tablet TAKE 1 TABLET BY MOUTH TWICE A DAY THEN 1 TAB AS NEEDED FOR ANXIETY 75 tablet 2  . famotidine (PEPCID) 20 MG tablet Take 1 tablet (20 mg total) by mouth at bedtime. 60 tablet 1  . lamoTRIgine (LAMICTAL) 150  MG tablet Take 1 tablet (150 mg total)  by mouth daily. 90 tablet 1  . levonorgestrel (MIRENA) 20 MCG/24HR IUD 1 each by Intrauterine route once.    . loratadine-pseudoephedrine (CLARITIN-D 24-HOUR) 10-240 MG 24 hr tablet Take 1 tablet by mouth daily as needed for allergies.     . Misc Natural Products (SUPER GREENS PO) Take 1 Scoop by mouth daily with breakfast. Mix with juice    . Multiple Vitamin (MULTIVITAMIN WITH MINERALS) TABS tablet Take 1 tablet by mouth daily.    . ondansetron (ZOFRAN) 4 MG tablet Take 1 tablet (4 mg total) by mouth daily as needed for nausea or vomiting. 30 tablet 1  . pantoprazole (PROTONIX) 40 MG tablet Take 1 tablet (40 mg total) by mouth 2 (two) times daily. (Patient taking differently: Take 40 mg by mouth daily. ) 180 tablet 3  . promethazine (PHENERGAN) 25 MG tablet Take 1 tablet (25 mg total) by mouth every 6 (six) hours as needed for nausea or vomiting. 30 tablet 0  . simethicone (GAS-X) 80 MG chewable tablet Chew 1 tablet (80 mg total) by mouth 4 (four) times daily as needed for flatulence. 100 tablet 2  . SUMAtriptan (IMITREX) 50 MG tablet TAKE 1 TAB EVERY 2 HRS AS NEEDED FOR MIGRAINE. MAY REPEAT IN 2 HOURS IF HEADACHE PERSISTS OR RECURS. 8 tablet 1  . tiZANidine (ZANAFLEX) 4 MG tablet Take 4 mg by mouth every 6 (six) hours as needed (spasms.).     Marland Kitchen topiramate (TOPAMAX) 25 MG tablet Take 1 tablet (25 mg total) by mouth at bedtime. 90 tablet 1  . traZODone (DESYREL) 100 MG tablet Take 1 tablet (100 mg total) by mouth at bedtime. 90 tablet 1  . Vilazodone HCl (VIIBRYD) 20 MG TABS Take 1.5 tablets (30 mg total) by mouth daily. 135 tablet 1  . Calcium Citrate (CAL-CITRATE PO) Take 1 tablet by mouth daily with supper.    . Chromium 500 MCG TABS Take 500 mcg by mouth 2 (two) times daily.     . COLLAGEN PO Take 1 Scoop by mouth daily. Mix in morning w/juice    . MAGNESIUM CITRATE PO Take 1 tablet by mouth daily with supper.    . Omega-3 Fatty Acids (SUPER OMEGA 3 PO) Take 2 capsules by mouth daily.    .  Probiotic Product (PROBIOTIC PO) Take 1 capsule by mouth daily. [place]     No current facility-administered medications for this visit.    Physical Exam:     BP 98/70   Pulse 88   Temp 97.7 F (36.5 C)   Ht 5\' 5"  (1.651 m)   Wt 126 lb 8 oz (57.4 kg)   BMI 21.05 kg/m   GENERAL:  Pleasant female in NAD PSYCH: : Cooperative, normal affect NEURO: Alert and oriented x 3, no focal neurologic deficits   IMPRESSION and PLAN:    1) GERD 2) s/p Transoral Incisionless fundoplication 3) MEG Pain 4) Odynophagia-resolved 5) Nausea without emesis  39 year old female with longstanding history of reflux refractory to high-dose PPI therapy, now s/p TIF on 03/26/2019.  Postop course complicated by odynophagia and chest pain, suspected due to mucosal rent of the esophagus.  Given concerns for wrap disruption due to postoperative nausea/vomiting, she was evaluated with barium esophagram on 06/11/2019 which demonstrates a intact, tight wrap without gastroesophageal refluxate.  Had a long conversation with the patient today regarding her MEG pain that radiates up through her chest.  Discussed  that the esophagram demonstrates a intact, appropriate appearing fundoplication zone and no refluxate, which is quite encouraging that the wrap is still intact.  I am suspicious about overlapping esophageal hypersensitivity and functional noncardiac chest pain as etiology for ongoing symptoms.  Discussed these diagnoses at length today.  I would typically trial course of SNRI, however with her multiple psych medications, including Viibryd (SSRI), would only do so in conjunction with her Psychiatry team.  I discussed with [name] at [facility] on 06/20/2019, with plan for follow-up with her therapist next week.  We decided to hold off on introducing the new medication at this juncture in favor of therapy and further discussion, but can potentially consider trial of SNRI  in the future if symptoms persist.  Joelene Millin and I additionally discussed the role of repeat endoscopic evaluation to look for erosive esophagitis, assessment of wrap integrity.  If doing this, would likely do in conjunction with Bravo study, to again confirm intact, functioning wrap.  -Okay to stop Pepcid -Resuming Protonix for now, but may consider de-escalating this as well pending the above -Can revisit potential EGD (+/- Bravo) pending response to the above -RTC in 6-8 weeks or sooner as needed  I spent 35 minutes of time, including in depth chart review, independent review of results as outlined above, communicating results with the patient directly, face-to-face time with the patient, coordinating care, discussing with other providers, and ordering studies and medications as appropriate, and documentation.          [place] ,DO, FACG 06/19/2019, 11:19 AM

## 2019-06-20 ENCOUNTER — Telehealth: Payer: Self-pay | Admitting: Psychiatry

## 2019-06-20 NOTE — Telephone Encounter (Signed)
Received message from GI specialist, Dr. Bryan Lemma who reports that pt has continued to have reflux s/s after surgery. He reports that recent swallowing study was WNL and that pt may have esophageal hypersensitivity that triggers reflux. He reports that SNRI's can be helpful for esophageal hypersensitivity and that pt was concerned about changing current psychiatric medications since she is doing well with current medications. Discussed that pt has had h/o adverse reactions and poor response to multiple other medications. Discussed that this provider spoke with pt's therapist, Lina Sayre, [facility], and that anxiety and trauma can contribute to some hypersensitivity and that therapy may be helpful to address her current s/s and that therapist had offered opening next week. Agreed to initiate non-pharmacological interventions at this time to improve her s/s. Called pt to discuss treatment plan and offer apt with therapist. Pt is unable to come to appointment next Wednesday due to being out of town on vacation. Therapist will be out on medical leave starting 06/27/19. Pt has therapy apt scheduled for 08/13/19 after therapist returns from leave and reports that she is ok waiting until that time to see therapist. Discussed that another therapist may be able to see pt 1-2 times during therapist's medical leave if needed. Pt reports that she will contact office if she decides that this would be helpful. Patient advised to contact office with any questions, adverse effects, or acute worsening in signs and symptoms.

## 2019-07-05 ENCOUNTER — Other Ambulatory Visit: Payer: Self-pay | Admitting: Gastroenterology

## 2019-07-05 DIAGNOSIS — K219 Gastro-esophageal reflux disease without esophagitis: Secondary | ICD-10-CM

## 2019-07-11 ENCOUNTER — Ambulatory Visit: Payer: BC Managed Care – PPO | Admitting: Gastroenterology

## 2019-07-28 ENCOUNTER — Other Ambulatory Visit: Payer: Self-pay | Admitting: Psychiatry

## 2019-07-28 DIAGNOSIS — G43911 Migraine, unspecified, intractable, with status migrainosus: Secondary | ICD-10-CM

## 2019-08-06 ENCOUNTER — Ambulatory Visit: Payer: BC Managed Care – PPO | Admitting: Psychiatry

## 2019-08-08 ENCOUNTER — Telehealth: Payer: Self-pay | Admitting: Gastroenterology

## 2019-08-08 NOTE — Telephone Encounter (Signed)
LMOM for patient to call back.

## 2019-08-13 ENCOUNTER — Other Ambulatory Visit: Payer: Self-pay

## 2019-08-13 ENCOUNTER — Ambulatory Visit (INDEPENDENT_AMBULATORY_CARE_PROVIDER_SITE_OTHER): Payer: BC Managed Care – PPO | Admitting: Psychiatry

## 2019-08-13 DIAGNOSIS — F4310 Post-traumatic stress disorder, unspecified: Secondary | ICD-10-CM

## 2019-08-13 NOTE — Progress Notes (Signed)
[facility] Counselor/Therapist Progress Note  Patient ID: Henrietta Cieslewicz, MRN: [id number],    Date: 08/13/2019  Time Spent: 51 minutes start time 1:09 PM end time 2:00 PM  Treatment Type: Individual Therapy  Reported Symptoms: anxiety, frustration, depression, triggered responses, physical issues  Mental Status Exam:  Appearance:   Casual and Neat     Behavior:  Appropriate  Motor:  Normal  Speech/Language:   Normal Rate  Affect:  Appropriate  Mood:  anxious  Thought process:  normal  Thought content:    WNL  Sensory/Perceptual disturbances:    WNL  Orientation:  oriented to person, place, time/date and situation  Attention:  Good  Concentration:  Good  Memory:  WNL  Fund of knowledge:   Good  Insight:    Good  Judgment:   Good  Impulse Control:  Good   Risk Assessment: Danger to Self:  No Self-injurious Behavior: No Danger to Others: No Duty to Warn:no Physical Aggression / Violence:No  Access to Firearms a concern: No  Gang Involvement:No   Subjective: Patient was present for session.  She shared that she has had issues with her stomach since her surgery.  She stated that she is waking up anxious and has been depressed since the surgery.  She stated she was thinking things would have been different and since they aren't it is hard.   She is not wanting to be around people and feeling very overwhelmed.  Patient was encouraged to think through when she is noticing the most difficulties and when she is noticing the most anxiety.  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  She shared the discussions are extremely negative and controversy-year-old and it is just getting very difficult to deal with all the situations.  Discussed different ways that she could work on helping the negativity change.  Encouraged patient to talk to other workers to see if there could be something positive that they could trying to help  raise fines or items for such as the [employer].  Patient was also encouraged to try and change the atmosphere appearance where there is more positive signs or things that she can see regularly to try and keep positive and the more forefront.  Patient was encouraged to continue thinking through things that she can eat without having negative side effects.  Patient was also encouraged to spend 1 day a week doing a lot of prep work so when she is tired she is still able to find something to eat without any issues.  Interventions: Cognitive Behavioral Therapy and Solution-Oriented/Positive Psychology  Diagnosis:   ICD-10-CM   1. PTSD (post-traumatic stress disorder)  F43.10     Plan: Patient is to use CBT and coping skills to decrease triggered responses.  Patient is to work on trying to figure out how to change the toxicity of her work environment to something more positive that can help her focus on something she can feel good about.  Patient is to continue finding different foods that she can eat that do not upset her stomach so that she can reassure herself that things are starting to move in a positive direction. Long-term goal: Develop and implement effective coping skills to carry out normal responsibilities and participate constructively in relationships Short-term goal: Implement a regular exercise regimen as a stress release technique.  Identify and replace negative self talk that is associated with past trauma and current  stimulus triggers for anxiety  Lina Sayre, [facility]

## 2019-08-20 DIAGNOSIS — Z1151 Encounter for screening for human papillomavirus (HPV): Secondary | ICD-10-CM | POA: Diagnosis not present

## 2019-08-20 DIAGNOSIS — Z87410 Personal history of cervical dysplasia: Secondary | ICD-10-CM | POA: Diagnosis not present

## 2019-08-20 DIAGNOSIS — Z1231 Encounter for screening mammogram for malignant neoplasm of breast: Secondary | ICD-10-CM | POA: Diagnosis not present

## 2019-08-20 DIAGNOSIS — Z01419 Encounter for gynecological examination (general) (routine) without abnormal findings: Secondary | ICD-10-CM | POA: Diagnosis not present

## 2019-08-20 DIAGNOSIS — N939 Abnormal uterine and vaginal bleeding, unspecified: Secondary | ICD-10-CM | POA: Diagnosis not present

## 2019-08-20 LAB — HM PAP SMEAR: HM Pap smear: NEGATIVE

## 2019-08-27 ENCOUNTER — Other Ambulatory Visit: Payer: Self-pay

## 2019-08-27 ENCOUNTER — Encounter: Payer: Self-pay | Admitting: Psychiatry

## 2019-08-27 ENCOUNTER — Ambulatory Visit (INDEPENDENT_AMBULATORY_CARE_PROVIDER_SITE_OTHER): Payer: BC Managed Care – PPO | Admitting: Psychiatry

## 2019-08-27 DIAGNOSIS — F411 Generalized anxiety disorder: Secondary | ICD-10-CM | POA: Diagnosis not present

## 2019-08-27 DIAGNOSIS — F4310 Post-traumatic stress disorder, unspecified: Secondary | ICD-10-CM | POA: Diagnosis not present

## 2019-08-27 DIAGNOSIS — F3342 Major depressive disorder, recurrent, in full remission: Secondary | ICD-10-CM | POA: Diagnosis not present

## 2019-08-27 MED ORDER — CLONAZEPAM 1 MG PO TABS: ORAL_TABLET | ORAL | 2 refills | Status: DC

## 2019-08-27 MED ORDER — LAMOTRIGINE 200 MG PO TABS: 200.0000 mg | ORAL_TABLET | Freq: Every day | ORAL | 1 refills | Status: DC

## 2019-08-27 NOTE — Progress Notes (Signed)
Breanna Young [id number] 10-28-1980 39 y.o.  Subjective:   Patient ID:  Breanna Young is a 39 y.o. (DOB [birth year]) female.  Chief Complaint:  Chief Complaint  Patient presents with  . Anxiety  . Depression  . Sleeping Problem    HPI Breanna Young presents to the office today for follow-up of depression and anxiety. Breanna Young reports that Breanna Young has been having some recent difficulty with depression and anxiety. Breanna Young reports that her energy and motivation have been low. Breanna Young reports that Breanna Young has anxiety upon awakening and feeling "butterflies" in her stomach. Breanna Young reports that anxiety has "been through the roof." Breanna Young reports that Breanna Young has been having nausea after certain foods. Breanna Young reports that Breanna Young is no longer having persistent nausea. Breanna Young reports at times Breanna Young feels as if Breanna Young is about to have a heart attack. Breanna Young reports that Breanna Young feels very sick after eating certain foods. Avoiding multiple foods. Breanna Young reports that her appetite is "mediocre." Breanna Young reports that Breanna Young has gained some weight. Breanna Young reports that Breanna Young has been withdrawing socially and has not been wanting to see or talk to people. Has been making excuses to break plans with friends. Breanna Young reports that Breanna Young is anxious that other people will notice that Breanna Young is not herself and some clients have commented that Breanna Young seems different. Breanna Young reports that Breanna Young prefers to stay at home in bed or on the couch. Motivation has been low. Breanna Young reports that her concentration has been significantly impaired and does not recall parts of the conversations that Breanna Young had. Anhedonia. Breanna Young reports that her sleep is "up and down." Reports that sleep is disrupted due to pain. Reports awakening and thinking Breanna Young has overslept and calculating how much longer Breanna Young can sleep. Occ irritability, typically when sleep has been decreased and/or physically feeling poorly. Denies SI.   Reports that Breanna Young would like to return to the gym.   Past Psychiatric Medication Trials: Viibryd- More effective at  40 mg dose and then had more "fogginess" and some sexual side effective. Not effective at 20 mg. Trintellix- Helpful for depression Zoloft-sexual side effects. Felt more "upbeat." Lexapro-adverse reaction Wellbutrin XL-increased anxiety and felt fuzzy headed Lamictal- helpful for mood and anxiety. Rexulti-affective dulling, weight gain Trazodone Ativan Xanax Klonopin Doxazosin-dissociation BuSpar-adverse reaction BC Powder- Ineffective Excedrin Migraine Tizanidine- ineffective for migraines Propranolol Ubrelvy Topamax    Review of Systems:  Review of Systems  Cardiovascular: Positive for chest pain.  Gastrointestinal: Positive for nausea.       Breanna Young reports some improvement in constipation  Musculoskeletal: Negative for gait problem.  Psychiatric/Behavioral:       Please refer to HPI  Had headache yesterday.   Medications: I have reviewed the patient's current medications.  Current Outpatient Medications  Medication Sig Dispense Refill  . clonazePAM (KLONOPIN) 1 MG tablet TAKE 1 TABLET BY MOUTH TWICE A DAY THEN 1 TAB AS NEEDED FOR ANXIETY 75 tablet 2  . COLLAGEN PO Take 1 Scoop by mouth daily. Mix in morning w/juice    . docusate sodium (COLACE) 100 MG capsule Take 100 mg by mouth daily as needed for mild constipation.    Marland Kitchen levonorgestrel (MIRENA) 20 MCG/24HR IUD 1 each by Intrauterine route once.    . Misc Natural Products (SUPER GREENS PO) Take 1 Scoop by mouth daily with breakfast. Mix with juice    . Multiple Vitamin (MULTIVITAMIN WITH MINERALS) TABS tablet Take 1 tablet by mouth daily.    . ondansetron (ZOFRAN) 4 MG tablet Take 1 tablet (4  mg total) by mouth daily as needed for nausea or vomiting. 30 tablet 1  . Probiotic Product (PROBIOTIC PO) Take 1 capsule by mouth daily. [place]    . Calcium Citrate (CAL-CITRATE PO) Take 1 tablet by mouth daily with supper. (Patient not taking: Reported on 08/27/2019)    . Chromium 500 MCG TABS  Take 500 mcg by mouth 2 (two) times daily.  (Patient not taking: Reported on 08/27/2019)    . famotidine (PEPCID) 20 MG tablet TAKE 1 TABLET BY MOUTH EVERYDAY AT BEDTIME (Patient not taking: Reported on 08/27/2019) 90 tablet 1  . lamoTRIgine (LAMICTAL) 200 MG tablet Take 1 tablet (200 mg total) by mouth daily. 30 tablet 1  . loratadine-pseudoephedrine (CLARITIN-D 24-HOUR) 10-240 MG 24 hr tablet Take 1 tablet by mouth daily as needed for allergies.  (Patient not taking: Reported on 08/27/2019)    . MAGNESIUM CITRATE PO Take 1 tablet by mouth daily with supper. (Patient not taking: Reported on 08/27/2019)    . Omega-3 Fatty Acids (SUPER OMEGA 3 PO) Take 2 capsules by mouth daily. (Patient not taking: Reported on 08/27/2019)    . pantoprazole (PROTONIX) 40 MG tablet Take 1 tablet (40 mg total) by mouth 2 (two) times daily. (Patient taking differently: Take 40 mg by mouth daily. ) 180 tablet 3  . promethazine (PHENERGAN) 25 MG tablet Take 1 tablet (25 mg total) by mouth every 6 (six) hours as needed for nausea or vomiting. 30 tablet 0  . simethicone (GAS-X) 80 MG chewable tablet Chew 1 tablet (80 mg total) by mouth 4 (four) times daily as needed for flatulence. 100 tablet 2  . SUMAtriptan (IMITREX) 50 MG tablet TAKE 1 TAB EVERY 2 HRS AS NEEDED FOR MIGRAINE. MAY REPEAT IN 2 HOURS IF HEADACHE PERSISTS OR RECURS. 8 tablet 1  . tiZANidine (ZANAFLEX) 4 MG tablet Take 4 mg by mouth every 6 (six) hours as needed (spasms.).     Marland Kitchen topiramate (TOPAMAX) 25 MG tablet Take 1 tablet (25 mg total) by mouth at bedtime. 90 tablet 1  . traZODone (DESYREL) 100 MG tablet Take 1 tablet (100 mg total) by mouth at bedtime. 90 tablet 1  . Vilazodone HCl (VIIBRYD) 20 MG TABS Take 1.5 tablets (30 mg total) by mouth daily. 135 tablet 1   No current facility-administered medications for this visit.    Medication Side Effects: None  Allergies:  Allergies  Allergen Reactions  . Aluminum-Containing Compounds     Rash/itchy  .  Avelox [Moxifloxacin Hcl In Nacl] Other (See Comments)    Unknown reaction type (possible rash reaction)   . Dexlansoprazole Other (See Comments)    Unknown reaction type (possible rash reaction)  . Doxazosin Other (See Comments)    Dizziness/faint  . Reglan [Metoclopramide]     Hallucinations and a contraindication to her viibyrd    Past Medical History:  Diagnosis Date  . GERD (gastroesophageal reflux disease)   . Hiatal hernia   . IBS (irritable bowel syndrome)     Family History  Problem Relation Age of Onset  . Depression Mother   . Diabetes Mother   . GER disease Mother        said Breanna Young had it for a long time  . Heart attack Father   . Diabetes Father   . Depression Maternal Grandmother   . Endometrial cancer Maternal Grandmother   . Dementia Maternal Grandfather   . Diabetes Maternal Grandfather   . Dementia Paternal Grandmother   .  Colon cancer Neg Hx   . Breast cancer Neg Hx   . Esophageal cancer Neg Hx     Social History   Socioeconomic History  . Marital status: Single    Spouse name: Not on file  . Number of children: Not on file  . Years of education: Not on file  . Highest education level: Not on file  Occupational History  . Occupation: hair stylist  Tobacco Use  . Smoking status: Never Smoker  . Smokeless tobacco: Never Used  Vaping Use  . Vaping Use: Never used  Substance and Sexual Activity  . Alcohol use: Not Currently    Alcohol/week: 2.0 standard drinks    Types: 2 Glasses of wine per week  . Drug use: Never  . Sexual activity: Yes    Partners: Male    Birth control/protection: I.U.D.  Other Topics Concern  . Not on file  Social History Narrative  . Not on file   Social Determinants of Health   Financial Resource Strain:   . Difficulty of Paying Living Expenses:   Food Insecurity:   . Worried About Charity fundraiser in the Last Year:   . Arboriculturist in the Last Year:   Transportation Needs:   . Film/video editor  (Medical):   Marland Kitchen Lack of Transportation (Non-Medical):   Physical Activity:   . Days of Exercise per Week:   . Minutes of Exercise per Session:   Stress:   . Feeling of Stress :   Social Connections:   . Frequency of Communication with Friends and Family:   . Frequency of Social Gatherings with Friends and Family:   . Attends Religious Services:   . Active Member of Clubs or Organizations:   . Attends Archivist Meetings:   Marland Kitchen Marital Status:   Intimate Partner Violence:   . Fear of Current or Ex-Partner:   . Emotionally Abused:   Marland Kitchen Physically Abused:   . Sexually Abused:     Past Medical History, Surgical history, Social history, and Family history were reviewed and updated as appropriate.   Please see review of systems for further details on the patient's review from today.   Objective:   Physical Exam:  There were no vitals taken for this visit.  Physical Exam Constitutional:      General: Breanna Young is not in acute distress. Musculoskeletal:        General: No deformity.  Neurological:     Mental Status: Breanna Young is alert and oriented to person, place, and time.     Coordination: Coordination normal.  Psychiatric:        Attention and Perception: Attention and perception normal. Breanna Young does not perceive auditory or visual hallucinations.        Mood and Affect: Mood is anxious and depressed. Affect is not labile, blunt, angry or inappropriate.        Speech: Speech normal.        Behavior: Behavior normal.        Thought Content: Thought content normal. Thought content is not paranoid or delusional. Thought content does not include homicidal or suicidal ideation. Thought content does not include homicidal or suicidal plan.        Cognition and Memory: Cognition and memory normal.        Judgment: Judgment normal.     Comments: Insight intact     Lab Review:  No results found for: NA, K, CL, CO2, GLUCOSE, BUN, CREATININE, CALCIUM, PROT,  ALBUMIN, AST, ALT, ALKPHOS,  BILITOT, GFRNONAA, GFRAA  No results found for: WBC, RBC, HGB, HCT, PLT, MCV, MCH, MCHC, RDW, LYMPHSABS, MONOABS, EOSABS, BASOSABS  No results found for: POCLITH, LITHIUM   No results found for: PHENYTOIN, PHENOBARB, VALPROATE, CBMZ   .res Assessment: Plan:   Discussed several possible treatment options for anxiety and depressive s/s and possible benefits, risks, and side effects to include increase Viibryd to 40 mg po qd which was helpful in the past, however caused side effects; or increasing Lamictal to 200 mg po qd since Breanna Young reports that Lamictal has been helpful for her mood and anxiety. Breanna Young reports that Breanna Young would prefer to increase Lamictal at this time. Discussed that Lamictal is being used off-label for treatment of depression and anxiety.  Discussed sleep disturbance and Breanna Young reports that insomnia is primarily due to sleep disturbance with increased pain, although Breanna Young no longer feels as sleepy after taking Trazodone. Discussed considering increase in Trazodone in the future if insomnia persists or worsens. Also discussed considering Dayvigo or Belsomra if insomnia does not improve.  Will continue Viibryd 30 mg po qd for depression and anxiety.  Continue Trazodone 100 mg po QHS prn insomnia.  Continue Klonopin 1 mg po BID and 1 tab po qd prn anxiety.  Continue Topamax 25 mg po QHS for migraines and anxiety.  Recommend continuing psychotherapy with Lina Sayre, [id number]. Breanna Young to f/u with this provider in 4 weeks or sooner if clinically indicated.  Patient advised to contact office with any questions, adverse effects, or acute worsening in signs and symptoms.  Breanna Young was seen today for anxiety, depression and sleeping problem.  Diagnoses and all orders for this visit:  Recurrent major depressive disorder, in full remission ([place]) -     lamoTRIgine (LAMICTAL) 200 MG tablet; Take 1 tablet (200 mg total) by mouth daily.  PTSD (post-traumatic stress disorder) -     clonazePAM (KLONOPIN) 1 MG  tablet; TAKE 1 TABLET BY MOUTH TWICE A DAY THEN 1 TAB AS NEEDED FOR ANXIETY  Generalized anxiety disorder -     clonazePAM (KLONOPIN) 1 MG tablet; TAKE 1 TABLET BY MOUTH TWICE A DAY THEN 1 TAB AS NEEDED FOR ANXIETY     Please see After Visit Summary for patient specific instructions.  Future Appointments  Date Time Provider [place]  09/17/2019  3:00 PM Lina Sayre, [facility] CP-CP None  09/24/2019  1:00 PM [name], PMHNP CP-CP None    No orders of the defined types were placed in this encounter.   -------------------------------

## 2019-09-10 DIAGNOSIS — Z3202 Encounter for pregnancy test, result negative: Secondary | ICD-10-CM | POA: Diagnosis not present

## 2019-09-10 DIAGNOSIS — Z30433 Encounter for removal and reinsertion of intrauterine contraceptive device: Secondary | ICD-10-CM | POA: Diagnosis not present

## 2019-09-16 ENCOUNTER — Other Ambulatory Visit: Payer: Self-pay | Admitting: Psychiatry

## 2019-09-16 DIAGNOSIS — G43911 Migraine, unspecified, intractable, with status migrainosus: Secondary | ICD-10-CM

## 2019-09-17 ENCOUNTER — Ambulatory Visit: Payer: BC Managed Care – PPO | Admitting: Psychiatry

## 2019-09-21 ENCOUNTER — Other Ambulatory Visit: Payer: Self-pay | Admitting: Psychiatry

## 2019-09-21 DIAGNOSIS — F3342 Major depressive disorder, recurrent, in full remission: Secondary | ICD-10-CM

## 2019-09-24 ENCOUNTER — Ambulatory Visit (INDEPENDENT_AMBULATORY_CARE_PROVIDER_SITE_OTHER): Payer: BC Managed Care – PPO | Admitting: Psychiatry

## 2019-09-24 ENCOUNTER — Other Ambulatory Visit: Payer: Self-pay

## 2019-09-24 ENCOUNTER — Telehealth: Payer: Self-pay | Admitting: Psychiatry

## 2019-09-24 DIAGNOSIS — F5105 Insomnia due to other mental disorder: Secondary | ICD-10-CM | POA: Diagnosis not present

## 2019-09-24 DIAGNOSIS — F4310 Post-traumatic stress disorder, unspecified: Secondary | ICD-10-CM

## 2019-09-24 DIAGNOSIS — F3342 Major depressive disorder, recurrent, in full remission: Secondary | ICD-10-CM | POA: Diagnosis not present

## 2019-09-24 DIAGNOSIS — F411 Generalized anxiety disorder: Secondary | ICD-10-CM | POA: Diagnosis not present

## 2019-09-24 DIAGNOSIS — G43911 Migraine, unspecified, intractable, with status migrainosus: Secondary | ICD-10-CM

## 2019-09-24 MED ORDER — TOPIRAMATE 25 MG PO TABS: 25.0000 mg | ORAL_TABLET | Freq: Every day | ORAL | 1 refills | Status: DC

## 2019-09-24 MED ORDER — LAMOTRIGINE 200 MG PO TABS: 200.0000 mg | ORAL_TABLET | Freq: Every day | ORAL | 0 refills | Status: DC

## 2019-09-24 MED ORDER — VIIBRYD 20 MG PO TABS: 30.0000 mg | ORAL_TABLET | Freq: Every day | ORAL | 1 refills | Status: DC

## 2019-09-24 MED ORDER — TRAZODONE HCL 100 MG PO TABS: ORAL_TABLET | ORAL | 1 refills | Status: DC

## 2019-09-24 NOTE — Progress Notes (Signed)
Breanna Young [id number] [birth year] 39 y.o.  Subjective:   Patient ID:  Breanna Young is a 39 y.o. (DOB 04-16-1980) female.  Chief Complaint:  Chief Complaint  Patient presents with   Anxiety    HPI Breanna Young presents to the office today for follow-up of depression, anxiety, and insomnia. She reports that she has some anxiety in the morning and notices some uncomfortable sensations and what she describes as nerve pain in her gut and feels nauseous. Has some anxiety about how much sleep she is going to get and has started keeping her phone face down so she will not look at the phone. She has been trying to go to bed earlier. Reports sleeping about 6 hours a night on a good night. Falling asleep without difficulty. Waking up multiple times. Vivid, weird dreams. Appetite has been "decent." She reports that some days appetite is poor when she is having more GI side effects. She reports that she is getting out a little more. Some improvement in energy and motivation. Reports energy and motivation remain lower than she would like. Reports that she is already dreading work tomorrow due to anxiety. Has anxiety about GI s/s and when they will happen and how she will cope with it. Denies any full blown panic attacks. Has had some panic s/s where she has had to take Klonopin to prevent progression to full blown panic. Sad mood is improving. She reports some irritability. She reports occasional word finding difficulties and it occurs more when anxiety is higher. Reports word finding difficulties have been worse over the last few weeks. Denies SI.    Past Psychiatric Medication Trials: Viibryd- More effective at 40 mg dose and then had more "fogginess" and some sexual side effective. Not effective at 20 mg. Trintellix- Helpful for depression Zoloft-sexual side effects. Felt more "upbeat." Lexapro-adverse reaction Wellbutrin XL-increased anxiety and felt fuzzy headed Lamictal- helpful for mood and  anxiety. Rexulti-affective dulling, weight gain Trazodone Ativan Xanax Klonopin Doxazosin-dissociation BuSpar-adverse reaction BC Powder- Ineffective Excedrin Migraine Tizanidine- ineffective for migraines Propranolol Ubrelvy Topamax  Review of Systems:  Review of Systems  Gastrointestinal: Positive for diarrhea and nausea.  Musculoskeletal: Negative for gait problem.  Neurological: Negative for tremors.  Psychiatric/Behavioral:       Please refer to HPI    Medications: I have reviewed the patient's current medications.  Current Outpatient Medications  Medication Sig Dispense Refill   Calcium Citrate (CAL-CITRATE PO) Take 1 tablet by mouth daily with supper. (Patient not taking: Reported on 08/27/2019)     Chromium 500 MCG TABS Take 500 mcg by mouth 2 (two) times daily.  (Patient not taking: Reported on 08/27/2019)     clonazePAM (KLONOPIN) 1 MG tablet TAKE 1 TABLET BY MOUTH TWICE A DAY THEN 1 TAB AS NEEDED FOR ANXIETY 75 tablet 2   COLLAGEN PO Take 1 Scoop by mouth daily. Mix in morning w/juice     docusate sodium (COLACE) 100 MG capsule Take 100 mg by mouth daily as needed for mild constipation.     famotidine (PEPCID) 20 MG tablet TAKE 1 TABLET BY MOUTH EVERYDAY AT BEDTIME (Patient not taking: Reported on 08/27/2019) 90 tablet 1   lamoTRIgine (LAMICTAL) 200 MG tablet Take 1 tablet (200 mg total) by mouth daily. 90 tablet 0   levonorgestrel (MIRENA) 20 MCG/24HR IUD 1 each by Intrauterine route once.     loratadine-pseudoephedrine (CLARITIN-D 24-HOUR) 10-240 MG 24 hr tablet Take 1 tablet by mouth daily as needed for allergies.  (Patient not  taking: Reported on 08/27/2019)     MAGNESIUM CITRATE PO Take 1 tablet by mouth daily with supper. (Patient not taking: Reported on 08/27/2019)     Misc Natural Products (SUPER GREENS PO) Take 1 Scoop by mouth daily with breakfast. Mix with juice     Multiple Vitamin (MULTIVITAMIN WITH MINERALS) TABS tablet Take 1 tablet by mouth  daily.     Omega-3 Fatty Acids (SUPER OMEGA 3 PO) Take 2 capsules by mouth daily. (Patient not taking: Reported on 08/27/2019)     ondansetron (ZOFRAN) 4 MG tablet Take 1 tablet (4 mg total) by mouth daily as needed for nausea or vomiting. 30 tablet 1   pantoprazole (PROTONIX) 40 MG tablet Take 1 tablet (40 mg total) by mouth 2 (two) times daily. (Patient taking differently: Take 40 mg by mouth daily. ) 180 tablet 3   Probiotic Product (PROBIOTIC PO) Take 1 capsule by mouth daily. [employer] Suprema Dophilus     promethazine (PHENERGAN) 25 MG tablet Take 1 tablet (25 mg total) by mouth every 6 (six) hours as needed for nausea or vomiting. 30 tablet 0   simethicone (GAS-X) 80 MG chewable tablet Chew 1 tablet (80 mg total) by mouth 4 (four) times daily as needed for flatulence. 100 tablet 2   SUMAtriptan (IMITREX) 50 MG tablet TAKE 1 TAB EVERY 2 HRS AS NEEDED FOR MIGRAINE. MAY REPEAT IN 2 HOURS IF HEADACHE PERSISTS OR RECURS. 8 tablet 1   tiZANidine (ZANAFLEX) 4 MG tablet Take 4 mg by mouth every 6 (six) hours as needed (spasms.).      topiramate (TOPAMAX) 25 MG tablet Take 1 tablet (25 mg total) by mouth at bedtime. 90 tablet 1   traZODone (DESYREL) 100 MG tablet Take 1.5 tabs po QHS prn insomnia 135 tablet 1   Vilazodone HCl (VIIBRYD) 20 MG TABS Take 1.5 tablets (30 mg total) by mouth daily. 135 tablet 1   No current facility-administered medications for this visit.    Medication Side Effects: None  Allergies:  Allergies  Allergen Reactions   Aluminum-Containing Compounds     Rash/itchy   Avelox [Moxifloxacin Hcl In Nacl] Other (See Comments)    Unknown reaction type (possible rash reaction)    Dexlansoprazole Other (See Comments)    Unknown reaction type (possible rash reaction)   Doxazosin Other (See Comments)    Dizziness/faint   Reglan [Metoclopramide]     Hallucinations and a contraindication to her viibyrd    Past Medical History:  Diagnosis Date    GERD (gastroesophageal reflux disease)    Hiatal hernia    IBS (irritable bowel syndrome)     Family History  Problem Relation Age of Onset   Depression Mother    Diabetes Mother    GER disease Mother        said she had it for a long time   Heart attack Father    Diabetes Father    Depression Maternal Grandmother    Endometrial cancer Maternal Grandmother    Dementia Maternal Grandfather    Diabetes Maternal Grandfather    Dementia Paternal Grandmother    Colon cancer Neg Hx    Breast cancer Neg Hx    Esophageal cancer Neg Hx     Social History   Socioeconomic History   Marital status: Single    Spouse name: Not on file   Number of children: Not on file   Years of education: Not on file   Highest education level: Not on  file  Occupational History   Occupation: hair stylist  Tobacco Use   Smoking status: Never Smoker   Smokeless tobacco: Never Used  Vaping Use   Vaping Use: Never used  Substance and Sexual Activity   Alcohol use: Not Currently    Alcohol/week: 2.0 standard drinks    Types: 2 Glasses of wine per week   Drug use: Never   Sexual activity: Yes    Partners: Male    Birth control/protection: I.U.D.  Other Topics Concern   Not on file  Social History Narrative   Not on file   Social Determinants of Health   Financial Resource Strain:    Difficulty of Paying Living Expenses: Not on file  Food Insecurity:    Worried About Makena in the Last Year: Not on file   Ran Out of Food in the Last Year: Not on file  Transportation Needs:    Lack of Transportation (Medical): Not on file   Lack of Transportation (Non-Medical): Not on file  Physical Activity:    Days of Exercise per Week: Not on file   Minutes of Exercise per Session: Not on file  Stress:    Feeling of Stress : Not on file  Social Connections:    Frequency of Communication with Friends and Family: Not on file   Frequency of Social  Gatherings with Friends and Family: Not on file   Attends Religious Services: Not on file   Active Member of Clubs or Organizations: Not on file   Attends Archivist Meetings: Not on file   Marital Status: Not on file  Intimate Partner Violence:    Fear of Current or Ex-Partner: Not on file   Emotionally Abused: Not on file   Physically Abused: Not on file   Sexually Abused: Not on file    Past Medical History, Surgical history, Social history, and Family history were reviewed and updated as appropriate.   Please see review of systems for further details on the patient's review from today.   Objective:   Physical Exam:  There were no vitals taken for this visit.  Physical Exam Constitutional:      General: She is not in acute distress. Musculoskeletal:        General: No deformity.  Neurological:     Mental Status: She is alert and oriented to person, place, and time.     Coordination: Coordination normal.  Psychiatric:        Attention and Perception: Attention and perception normal. She does not perceive auditory or visual hallucinations.        Mood and Affect: Mood is anxious. Mood is not depressed. Affect is not labile, blunt, angry or inappropriate.        Speech: Speech normal.        Behavior: Behavior normal.        Thought Content: Thought content normal. Thought content is not paranoid or delusional. Thought content does not include homicidal or suicidal ideation. Thought content does not include homicidal or suicidal plan.        Cognition and Memory: Cognition and memory normal.        Judgment: Judgment normal.     Comments: Insight intact     Lab Review:  No results found for: NA, K, CL, CO2, GLUCOSE, BUN, CREATININE, CALCIUM, PROT, ALBUMIN, AST, ALT, ALKPHOS, BILITOT, GFRNONAA, GFRAA  No results found for: WBC, RBC, HGB, HCT, PLT, MCV, MCH, MCHC, RDW, LYMPHSABS, MONOABS, EOSABS, BASOSABS  No results found for: POCLITH, LITHIUM   No  results found for: PHENYTOIN, PHENOBARB, VALPROATE, CBMZ   .res Assessment: Plan:   Discussed possible treatment options for anxiety.  Also discussed that she may wish to consider checking vitamin levels to rule out vitamin deficiency since this could potentially cause some of her physical signs and symptoms and exacerbate anxiety.  Discussed that this provider could order labs, however patient reports that she plans to establish care with new PCP.  Encouraged patient to contact PCP as soon as possible and to contact this provider if she is not able to see medical provider in the near future and this provider would be able to order labs for her. Patient reports that she would prefer not to make any significant medication changes or start any new medications at this time.  She reports that she would prefer to increase trazodone to possibly improve insomnia since she anticipates that this would be helpful for her overall mood and anxiety as well.  Will increase trazodone to 150 mg at bedtime for insomnia. Continue Viibryd 30 mg daily for anxiety and depression. Continue Topamax 25 mg at bedtime for insomnia and migraines. Continue lamotrigine 200 mg daily for depression. Recommend continuing psychotherapy with Lina Sayre, [facility]. Patient to follow-up with this provider in 2 months or sooner if clinically indicated. Patient advised to contact office with any questions, adverse effects, or acute worsening in signs and symptoms.  Sindee was seen today for anxiety.  Diagnoses and all orders for this visit:  Generalized anxiety disorder -     Vilazodone HCl (VIIBRYD) 20 MG TABS; Take 1.5 tablets (30 mg total) by mouth daily.  Insomnia due to mental disorder -     Discontinue: traZODone (DESYREL) 100 MG tablet; Take 1-1.5 tabs po QHS prn insomnia -     topiramate (TOPAMAX) 25 MG tablet; Take 1 tablet (25 mg total) by mouth at bedtime.  PTSD (post-traumatic stress disorder) -     Vilazodone HCl  (VIIBRYD) 20 MG TABS; Take 1.5 tablets (30 mg total) by mouth daily.  Recurrent major depressive disorder, in full remission (HCC) -     Vilazodone HCl (VIIBRYD) 20 MG TABS; Take 1.5 tablets (30 mg total) by mouth daily. -     lamoTRIgine (LAMICTAL) 200 MG tablet; Take 1 tablet (200 mg total) by mouth daily.  Intractable migraine with status migrainosus, unspecified migraine type -     topiramate (TOPAMAX) 25 MG tablet; Take 1 tablet (25 mg total) by mouth at bedtime.     Please see After Visit Summary for patient specific instructions.  Future Appointments  Date Time Provider Frederika  10/22/2019  1:00 PM Lina Sayre, [facility] CP-CP None  11/05/2019  1:30 PM Inda Coke, [facility]  11/12/2019  1:45 PM [name], [facility] CP-CP None    No orders of the defined types were placed in this encounter.   -------------------------------

## 2019-09-24 NOTE — Telephone Encounter (Signed)
Pt called stating that [facility] [address], is asking since her Trazadone has been increased from 100 mg to 150 mg will she take 1 and then split one in half to make 1-1/2 pills? Pt # is [phone].

## 2019-09-25 ENCOUNTER — Other Ambulatory Visit: Payer: Self-pay

## 2019-09-25 DIAGNOSIS — F5105 Insomnia due to other mental disorder: Secondary | ICD-10-CM

## 2019-09-25 MED ORDER — TRAZODONE HCL 100 MG PO TABS: ORAL_TABLET | ORAL | 1 refills | Status: DC

## 2019-09-25 NOTE — Telephone Encounter (Signed)
Updated order resent to [facility]

## 2019-09-26 ENCOUNTER — Encounter: Payer: Self-pay | Admitting: Psychiatry

## 2019-10-01 ENCOUNTER — Telehealth: Payer: Self-pay | Admitting: Gastroenterology

## 2019-10-01 ENCOUNTER — Ambulatory Visit: Payer: BC Managed Care – PPO | Admitting: Psychiatry

## 2019-10-01 NOTE — Telephone Encounter (Signed)
Patient reports over the past few week her reflux symptoms are becoming more noticeable. She had the TIF procedure in 3/21 and reports feeling pain in her abdomen and lower part of her esophagus. She vomited bile several times yesterday and felt a lump in her throat after drinking liquids. She has not added any new foods or medication. She takes Protonix 40 mg daily. Patient would like some advise if these symptoms were common so far past her procedure,or if she should be taking a different medication. Dr Bryan Lemma please advise.

## 2019-10-02 ENCOUNTER — Other Ambulatory Visit: Payer: Self-pay | Admitting: Gastroenterology

## 2019-10-02 MED ORDER — PANTOPRAZOLE SODIUM 20 MG PO TBEC: 20.0000 mg | DELAYED_RELEASE_TABLET | Freq: Every day | ORAL | 0 refills | Status: DC

## 2019-10-02 NOTE — Telephone Encounter (Signed)
Based on description, difficult to parse out recurrence of reflux vs an overlap syndrome. Would schedule appt with me to discuss. In the interim, lets make an empiric change from Protonix to Pepcid 20 mg daily to see if that makes any difference. We may consider further diagnostics, to include EGD to assess wrap integrity and Bravo placement to eval for breakthrough refluxate. Thanks.

## 2019-10-02 NOTE — Telephone Encounter (Signed)
Spoke to patient to inform her that Protonix 20 mg daily for 1 week has been sent to her pharmacy. She voiced understanding.

## 2019-10-02 NOTE — Telephone Encounter (Signed)
Spoke to patient to inform her of Dr De Hollingshead recommendations to change from Protonix 40 mg to Pepcid 20 mg daily. She has a follow up appointment with you in a few weeks, but questioned if it was safe to abruptly stop Protonix. She is concerned that she may have a surge in acid reflux due to being a different class medication and lower dose. Dr Bryan Lemma please advise.

## 2019-10-02 NOTE — Telephone Encounter (Signed)
LMOM for patient to call back.

## 2019-10-02 NOTE — Telephone Encounter (Signed)
Risk of rebound hyper acid secretion should be mitigated by starting the H2 blocker, but if would like to be extra cautious, ok to cut Protnix to 20 mg/day x7 days while starting the Pepcid, then can discontinue altogether. Thanks.

## 2019-10-03 ENCOUNTER — Encounter: Payer: Self-pay | Admitting: Gastroenterology

## 2019-10-03 ENCOUNTER — Other Ambulatory Visit: Payer: Self-pay | Admitting: Gastroenterology

## 2019-10-03 ENCOUNTER — Telehealth (INDEPENDENT_AMBULATORY_CARE_PROVIDER_SITE_OTHER): Payer: BC Managed Care – PPO | Admitting: Gastroenterology

## 2019-10-03 ENCOUNTER — Telehealth: Payer: Self-pay | Admitting: Gastroenterology

## 2019-10-03 DIAGNOSIS — R142 Eructation: Secondary | ICD-10-CM

## 2019-10-03 DIAGNOSIS — Z9889 Other specified postprocedural states: Secondary | ICD-10-CM

## 2019-10-03 DIAGNOSIS — R1013 Epigastric pain: Secondary | ICD-10-CM | POA: Diagnosis not present

## 2019-10-03 DIAGNOSIS — K219 Gastro-esophageal reflux disease without esophagitis: Secondary | ICD-10-CM

## 2019-10-03 DIAGNOSIS — R112 Nausea with vomiting, unspecified: Secondary | ICD-10-CM

## 2019-10-03 DIAGNOSIS — R0789 Other chest pain: Secondary | ICD-10-CM | POA: Diagnosis not present

## 2019-10-03 MED ORDER — FAMOTIDINE 20 MG PO TABS: 20.0000 mg | ORAL_TABLET | Freq: Every day | ORAL | 3 refills | Status: DC

## 2019-10-03 NOTE — Patient Instructions (Signed)
If you are age 40 or older, your body mass index should be between 23-30. Your There is no height or weight on file to calculate BMI. If this is out of the aforementioned range listed, please consider follow up with your Primary Care Provider.  If you are age 75 or younger, your body mass index should be between 19-25. Your There is no height or weight on file to calculate BMI. If this is out of the aformentioned range listed, please consider follow up with your Primary Care Provider.   We will call you in regards to EGD w/Bravo - Must be off PPI five days prior.  It was a pleasure to see you today!  Vito Cirigliano, D.O.

## 2019-10-03 NOTE — Progress Notes (Signed)
Chief Complaint: Epigastric pain, lower chest pain, decreased p.o. tolerance   GI history: Breanna Young is a 39 year old femalewith a history of GAD, depression, insomnia, migraines,along with a longstanding historyGERD,referred to me byDr. Perryfor Transoral Incisionless Fundoplication (TIF) with a goal to stop or significantly reduce acid suppression therapy.TIF completed on 03/26/2019.  Prior to TIF, she had a longstanding history of reflux for the last 15+ years,with index symptoms of pyrosis, regurgitation, particularly regurgitation with forward flexion, along with nocturnal symptoms of coughing and choking. Treated with pantoprazole which initially worked well, with decreasing efficacy overtime, with increasing regurgitation, particularly at night.Reflux sxs worse with acidic foods, spicy, coffee, wine.No dysphagia.   GERD history: -Index symptoms:Pyrosis, regurgitation, nocturnal symptoms (coughing, choking) -Medications trialed:Pantoprazoe, omeprazole, Tums, Alka-Seltzer -Current medications:Pantoprazole 40 mgbid, Tums prn -Complications:None  GERD evaluation: -EGD approximately 10 years ago and was told she had a hiatal hernia. No details available for review. -EGD(12/2018, Dr. Perry):Normal esophagus, fundic gland polyps, normal duodenum -Barium esophagram(12/2018):Minimal reflux into distal esophagus. Normal motility -EGD with TIF (03/26/2019, Dr. Bryan Lemma): Mucosal rent to 18 cm with 54FrMaloney dilator consistent with successful dilation pre-TIF. Successful TIF with 26 Serofusefasteners placed with 3 cm valve, 270 degrees  -Esophageal Manometry:N/A -pH/Impedance:N/A  GERD HRQLQuestionnaire Score: 26/50 (on high-dose PPI) and marked "dissatisfied"with satisfaction of current health condition(preop, 03/05/2019).  -Barium esophagram (06/11/2019): Appropriate fundoplication defect seen at the [place] [facility].  Normal motility, 13 mm  barium tablet passed easily into the stomach.  No esophageal reflux noted.  Separately, history of IBS mixed type which is largely controlled with dietary modifications/high-fiber diet   HPI:    Due to current restrictions/limitations of in-office visits due to the COVID-19 pandemic, this scheduled clinical appointment was converted to a telehealth virtual consultation.  Patient unable to connect to video conference, so converted to telephone call.  -Time of medical discussion:.  22 minutes -The patient did consent to this virtual visit and is aware of possible charges through their insurance for this visit.  -Names of all parties present: [name] (patient), Gerrit Heck, DO, [facility] (physician) -Patient location: Home -Physician location: Office  Breanna Young is a 39 y.o. female referred to the [facility] for follow-up.  Last seen on 06/19/2019 with multiple upper GI symptoms.  Was evaluated with barium esophagram which demonstrated appropriate fundoplication and no refluxate noted.  Had been doing well until abrupt onset recurrence of upper GI symptoms over the last week or so.  No improvement despite continuing Protonix 40 mg/day.  Decided to try to reduce Protonix and start Pepcid and expedited follow-up virtual appointment.  She states she started having MEG and lower chest pain for the last 1-1.5 week. No new meds or foods. No HB or regurgitation like her index reflux sxs per se. Bilious emesis on 9/14, and has felt "sour" since then. Worse with positional change and with standing will have pain in MEG and lower chest with burning sensation. +belch this week. Lost appetite through the week. Started bone broth and yogurt, but subsequent n/v. Trialed simethocone last evening without issue.   Past medical history, past surgical history, social history, family history, medications, and allergies reviewed in the chart and with patient.    Past Medical History:  Diagnosis  Date  . GERD (gastroesophageal reflux disease)   . Hiatal hernia   . IBS (irritable bowel syndrome)      Past Surgical History:  Procedure Laterality Date  .  ESOPHAGEAL DILATION  03/26/2019   Procedure: ESOPHAGEAL DILATION;  Surgeon: Lavena Bullion, DO;  Location: [facility];  Service: Gastroenterology;;  . ESOPHAGOGASTRODUODENOSCOPY (EGD) WITH PROPOFOL N/A 03/26/2019   Procedure: ESOPHAGOGASTRODUODENOSCOPY (EGD) WITH PROPOFOL;  Surgeon: Lavena Bullion, DO;  Location: [facility];  Service: Gastroenterology;  Laterality: N/A;  Food Bolus in stomach removed  . TONSILLECTOMY    . TRANSORAL INCISIONLESS FUNDOPLICATION N/A [id number]   Procedure: TRANSORAL INCISIONLESS FUNDOPLICATION;  Surgeon: Lavena Bullion, DO;  Location: [facility];  Service: Gastroenterology;  Laterality: N/A;   Family History  Problem Relation Age of Onset  . Depression Mother   . Diabetes Mother   . GER disease Mother        said she had it for a long time  . Heart attack Father   . Diabetes Father   . Depression Maternal Grandmother   . Endometrial cancer Maternal Grandmother   . Dementia Maternal Grandfather   . Diabetes Maternal Grandfather   . Dementia Paternal Grandmother   . Colon cancer Neg Hx   . Breast cancer Neg Hx   . Esophageal cancer Neg Hx    Social History   Tobacco Use  . Smoking status: Never Smoker  . Smokeless tobacco: Never Used  Vaping Use  . Vaping Use: Never used  Substance Use Topics  . Alcohol use: Not Currently    Alcohol/week: 2.0 standard drinks    Types: 2 Glasses of wine per week  . Drug use: Never   Current Outpatient Medications  Medication Sig Dispense Refill  . Calcium Citrate (CAL-CITRATE PO) Take 1 tablet by mouth daily with supper. (Patient not taking: Reported on 08/27/2019)    . Chromium 500 MCG TABS Take 500 mcg by mouth 2 (two) times daily.  (Patient not taking: Reported on 08/27/2019)    . clonazePAM (KLONOPIN) 1 MG tablet TAKE 1 TABLET BY  MOUTH TWICE A DAY THEN 1 TAB AS NEEDED FOR ANXIETY (Patient not taking: Reported on 10/03/2019) 75 tablet 2  . COLLAGEN PO Take 1 Scoop by mouth daily. Mix in morning w/juice (Patient not taking: Reported on 10/03/2019)    . docusate sodium (COLACE) 100 MG capsule Take 100 mg by mouth daily as needed for mild constipation. (Patient not taking: Reported on 10/03/2019)    . famotidine (PEPCID) 20 MG tablet Take 1 tablet (20 mg total) by mouth daily. (Patient not taking: Reported on 10/03/2019) 30 tablet 3  . lamoTRIgine (LAMICTAL) 200 MG tablet Take 1 tablet (200 mg total) by mouth daily. (Patient not taking: Reported on 10/03/2019) 90 tablet 0  . levonorgestrel (MIRENA) 20 MCG/24HR IUD 1 each by Intrauterine route once. (Patient not taking: Reported on 10/03/2019)    . loratadine-pseudoephedrine (CLARITIN-D 24-HOUR) 10-240 MG 24 hr tablet Take 1 tablet by mouth daily as needed for allergies.  (Patient not taking: Reported on 08/27/2019)    . MAGNESIUM CITRATE PO Take 1 tablet by mouth daily with supper. (Patient not taking: Reported on 08/27/2019)    . Misc Natural Products (SUPER GREENS PO) Take 1 Scoop by mouth daily with breakfast. Mix with juice (Patient not taking: Reported on 10/03/2019)    . Multiple Vitamin (MULTIVITAMIN WITH MINERALS) TABS tablet Take 1 tablet by mouth daily.    . Omega-3 Fatty Acids (SUPER OMEGA 3 PO) Take 2 capsules by mouth daily. (Patient not taking: Reported on 08/27/2019)    . ondansetron (ZOFRAN) 4 MG tablet Take 1 tablet (4 mg total) by mouth daily as needed  for nausea or vomiting. (Patient not taking: Reported on 10/03/2019) 30 tablet 1  . pantoprazole (PROTONIX) 20 MG tablet Take 1 tablet (20 mg total) by mouth daily for 7 days. (Patient not taking: Reported on 10/03/2019) 7 tablet 0  . Probiotic Product (PROBIOTIC PO) Take 1 capsule by mouth daily. [place]ze    . promethazine (PHENERGAN) 25 MG tablet Take 1 tablet (25 mg total) by mouth every 6  (six) hours as needed for nausea or vomiting. (Patient not taking: Reported on 10/03/2019) 30 tablet 0  . simethicone (GAS-X) 80 MG chewable tablet Chew 1 tablet (80 mg total) by mouth 4 (four) times daily as needed for flatulence. (Patient not taking: Reported on 10/03/2019) 100 tablet 2  . SUMAtriptan (IMITREX) 50 MG tablet TAKE 1 TAB EVERY 2 HRS AS NEEDED FOR MIGRAINE. MAY REPEAT IN 2 HOURS IF HEADACHE PERSISTS OR RECURS. (Patient not taking: Reported on 10/03/2019) 8 tablet 1  . tiZANidine (ZANAFLEX) 4 MG tablet Take 4 mg by mouth every 6 (six) hours as needed (spasms.).  (Patient not taking: Reported on 10/03/2019)    . topiramate (TOPAMAX) 25 MG tablet Take 1 tablet (25 mg total) by mouth at bedtime. (Patient not taking: Reported on 10/03/2019) 90 tablet 1  . traZODone (DESYREL) 100 MG tablet Take 1.5 tabs po QHS prn insomnia (Patient not taking: Reported on 10/03/2019) 135 tablet 1  . Vilazodone HCl (VIIBRYD) 20 MG TABS Take 1.5 tablets (30 mg total) by mouth daily. (Patient not taking: Reported on 10/03/2019) 135 tablet 1   No current facility-administered medications for this visit.   Allergies  Allergen Reactions  . Aluminum-Containing Compounds     Rash/itchy  . Avelox [Moxifloxacin Hcl In Nacl] Other (See Comments)    Unknown reaction type (possible rash reaction)   . Dexlansoprazole Other (See Comments)    Unknown reaction type (possible rash reaction)  . Doxazosin Other (See Comments)    Dizziness/faint  . Reglan [Metoclopramide]     Hallucinations and a contraindication to her viibyrd     Review of Systems: All systems reviewed and negative except where noted in HPI.     Physical Exam:    Complete physical exam not completed due to the nature of this telehealth communication.     ASSESSMENT AND PLAN;   1) History of fundoplication (TIF) 2) Epigastric pain 3) Noncardiac pain 4) Decreased p.o. tolerance 5) Belching 6) Nausea/Vomiting  Recurrence of upper GI symptoms  of MEG pain and noncardiac lower chest pain, similar to last evaluation.  Previous postoperative esophagram demonstrates appropriate appearing fundoplication zone and no refluxate.  Discussed at length previously and again today for potential overlapping esophageal hypersensitivity/functional chest pain, but certainly warrants further evaluation as outlined below:  -Plan for EGD with Bravo off PPI -EGD to also assess for wrap integrity, PUD, gastritis, other -Plan to titrate down off PPI given no appreciable change with/without medication.  Change to Protonix 20 mg/day x5 days then discontinue -Restarted Pepcid -Simethicone prn okay to use - Suspect some degree of aerophagia based on clinical description -Previously discussed with her Psychiatry team, [name] at [facility] psychiatry.  On multiple psych medications, including Viibryd (SSRI), so if planning on adding/exchanging for SNRI, would certainly need to do in concert with her  The indications, risks, and benefits of EGD with Bravo were explained to the patient in detail. Risks include but are not limited to bleeding, perforation, adverse reaction to medications, and cardiopulmonary compromise. Sequelae include  but are not limited to the possibility of surgery, hositalization, and mortality. The patient verbalized understanding and wished to proceed. All questions answered, referred to scheduler. Further recommendations pending results of the exam.    Lavena Bullion, DO, FACG  10/03/2019, 4:17 PM   Drake Leach, MD

## 2019-10-03 NOTE — Telephone Encounter (Signed)
Spoke to patient. Dr Bryan Lemma will contact patient with a virtual visit today to discuss next plan of care.

## 2019-10-07 ENCOUNTER — Other Ambulatory Visit: Payer: Self-pay | Admitting: Gastroenterology

## 2019-10-07 DIAGNOSIS — K219 Gastro-esophageal reflux disease without esophagitis: Secondary | ICD-10-CM

## 2019-10-07 DIAGNOSIS — Z9889 Other specified postprocedural states: Secondary | ICD-10-CM

## 2019-10-08 ENCOUNTER — Telehealth: Payer: Self-pay | Admitting: Gastroenterology

## 2019-10-08 DIAGNOSIS — Z1231 Encounter for screening mammogram for malignant neoplasm of breast: Secondary | ICD-10-CM | POA: Diagnosis not present

## 2019-10-08 NOTE — Telephone Encounter (Signed)
Spoke to patient to inform her of date and time for EGD/BRAVO in [place]. She will hold PPI for 5 days prior and have covid screening on 10/15/19. Appointment scheduled. All questions answered. Patient voiced understanding.

## 2019-10-09 ENCOUNTER — Encounter: Payer: Self-pay | Admitting: Gastroenterology

## 2019-10-09 LAB — HM MAMMOGRAPHY

## 2019-10-14 ENCOUNTER — Ambulatory Visit: Payer: BC Managed Care – PPO | Admitting: Gastroenterology

## 2019-10-15 ENCOUNTER — Other Ambulatory Visit: Payer: Self-pay | Admitting: Gastroenterology

## 2019-10-15 DIAGNOSIS — Z1159 Encounter for screening for other viral diseases: Secondary | ICD-10-CM | POA: Diagnosis not present

## 2019-10-15 LAB — SARS CORONAVIRUS 2 (TAT 6-24 HRS): SARS Coronavirus 2: NEGATIVE

## 2019-10-17 ENCOUNTER — Other Ambulatory Visit: Payer: Self-pay

## 2019-10-17 ENCOUNTER — Ambulatory Visit (AMBULATORY_SURGERY_CENTER): Payer: BC Managed Care – PPO | Admitting: Gastroenterology

## 2019-10-17 ENCOUNTER — Encounter: Payer: Self-pay | Admitting: Gastroenterology

## 2019-10-17 VITALS — BP 101/57 | HR 57 | Temp 98.6°F | Resp 13 | Ht 65.0 in | Wt 126.0 lb

## 2019-10-17 DIAGNOSIS — R12 Heartburn: Secondary | ICD-10-CM | POA: Diagnosis not present

## 2019-10-17 DIAGNOSIS — R1110 Vomiting, unspecified: Secondary | ICD-10-CM

## 2019-10-17 DIAGNOSIS — D131 Benign neoplasm of stomach: Secondary | ICD-10-CM | POA: Diagnosis not present

## 2019-10-17 DIAGNOSIS — Z9889 Other specified postprocedural states: Secondary | ICD-10-CM

## 2019-10-17 DIAGNOSIS — K219 Gastro-esophageal reflux disease without esophagitis: Secondary | ICD-10-CM | POA: Diagnosis not present

## 2019-10-17 MED ORDER — SODIUM CHLORIDE 0.9 % IV SOLN: 500.0000 mL | Freq: Once | INTRAVENOUS | Status: DC

## 2019-10-17 NOTE — Progress Notes (Signed)
Bravo capsule placed at 34 cm esophageal. Lot # [id number], expiration 02-02-2021.

## 2019-10-17 NOTE — Progress Notes (Signed)
robinol antisialogogue  Lidocaine   buffer 

## 2019-10-17 NOTE — Patient Instructions (Signed)
YOU HAD AN ENDOSCOPIC PROCEDURE TODAY AT [facility]:   Refer to the procedure report that was given to you for any specific questions about what was found during the examination.  If the procedure report does not answer your questions, please call your gastroenterologist to clarify.  If you requested that your care partner not be given the details of your procedure findings, then the procedure report has been included in a sealed envelope for you to review at your convenience later.  YOU SHOULD EXPECT: Some feelings of bloating in the abdomen. Passage of more gas than usual.  Walking can help get rid of the air that was put into your GI tract during the procedure and reduce the bloating. If you had a lower endoscopy (such as a colonoscopy or flexible sigmoidoscopy) you may notice spotting of blood in your stool or on the toilet paper. If you underwent a bowel prep for your procedure, you may not have a normal bowel movement for a few days.  Please Note:  You might notice some irritation and congestion in your nose or some drainage.  This is from the oxygen used during your procedure.  There is no need for concern and it should clear up in a day or so.  SYMPTOMS TO REPORT IMMEDIATELY:  Post-op Bravo pH instructions Once you get home:  Eat normally and go about your daily routine/activities Limit drinking fluids or eating between meals Do not chew gum or eat hard candy DO NOT take any antacid or anti-reflux medications during the 48-hour monitoring time, unless instructed by your physician  Recording events: Events to be recorded are:  Record using event buttons on recorder and write on paper diary form 1. Every time you eat or drink something (other than water) 2.   Periods of lying down/reclining 3.  Symptoms:  may include heartburn, regurgitation, chest pain, cough or specify if other.  A paper diary is also provided to record the times of your reflux symptoms and times for  meals and when you lie down.  The recorder needs to remain within 3 feet (arms length) of you during the testing period (48 hours). If you should forget and move outside of a 3-foot radius of the receiver you may hear beeping and you will see a "C1" error in the display window on the top of the receiver.  Please pick up the receiver and hold close to you to re-establish the connection and the error message disappears.  You may take a bath/shower during the testing period, but the recorder must not get wet and must remain within 3 feet of you. Please leave the receiver outside of the shower or tub while bathing. The monitoring period will be for 48 hours after placement of the capsule.  At the end of the 48 hours, you will return the recorder, and your diary, to our [facility] front desk.  A nurse will meet you to collect the device and answer any questions you may have.  The device should turn off once the 48 hours is complete.   What to expect after placement of the capsule:  Some patients experience a vague sensation that something is in their esophagus or that they 'feel' the capsule when they swallow food.  Should you experience this, chewing food carefully or drinking liquids may minimize this sensation.   After the test is complete, the disposable capsule will fall off the wall of your esophagus within 5-10 days and  pass naturally with your bowel movement through the digestive tract.  Once the recorder is returned, your provider will review and interpret your recordings and contact you to discuss your results.  This may take up to two weeks.   DO NOT have an MRI for 30 days after your procedure to ensure the capsule is no longer inside your body  It is imperative that you return the recorder on ___Monday 4 October ______________________ by 3:00pm.  Your information must be downloaded at this time to obtain your results.     Following upper endoscopy (EGD)  Vomiting of  blood or coffee ground material  New chest pain or pain under the shoulder blades  Painful or persistently difficult swallowing  New shortness of breath  Fever of 100F or higher  Black, tarry-looking stools  For urgent or emergent issues, a gastroenterologist can be reached at any hour by calling [phone]. Do not use MyChart messaging for urgent concerns.    DIET:  We do recommend a small meal at first, but then you may proceed to your regular diet.  Drink plenty of fluids but you should avoid alcoholic beverages for 24 hours.  ACTIVITY:  You should plan to take it easy for the rest of today and you should NOT DRIVE or use heavy machinery until tomorrow (because of the sedation medicines used during the test).    FOLLOW UP: Our staff will call the number listed on your records 48-72 hours following your procedure to check on you and address any questions or concerns that you may have regarding the information given to you following your procedure. If we do not reach you, we will leave a message.  We will attempt to reach you two times.  During this call, we will ask if you have developed any symptoms of COVID 19. If you develop any symptoms (ie: fever, flu-like symptoms, shortness of breath, cough etc.) before then, please call [phone].  If you test positive for Covid 19 in the 2 weeks post procedure, please call and report this information to [place].    If any biopsies were taken you will be contacted by phone or by letter within the next 1-3 weeks.  Please call us at [phone] if you have not heard about the biopsies in 3 weeks.    SIGNATURES/CONFIDENTIALITY: You and/or your care partner have signed paperwork which will be entered into your electronic medical record.  These signatures attest to the fact that that the information above on your After Visit Summary has been reviewed and is understood.  Full responsibility of the confidentiality of this discharge information lies  with you and/or your care-partner.

## 2019-10-17 NOTE — Progress Notes (Signed)
A and O x3. Report to RN. Tolerated MAC anesthesia well.Teeth unchanged after procedure.

## 2019-10-17 NOTE — Op Note (Signed)
[facility] Patient Name: Breanna Young Procedure Date: 10/17/2019 7:38 AM MRN: [id number] Endoscopist: Gerrit Heck , MD Age: 39 Referring MD:  Date of Birth: [birth year] Gender: Female Account #: [id number] Procedure:                Upper GI endoscopy with BRAVO placement Indications:              Heartburn, Exclusion of esophageal reflux                           Evaluation of post-operative anatomy                           Bravo placement (off PPI) Medicines:                Monitored Anesthesia Care Procedure:                Pre-Anesthesia Assessment:                           - Prior to the procedure, a History and Physical                            was performed, and patient medications and                            allergies were reviewed. The patient's tolerance of                            previous anesthesia was also reviewed. The risks                            and benefits of the procedure and the sedation                            options and risks were discussed with the patient.                            All questions were answered, and informed consent                            was obtained. Prior Anticoagulants: The patient has                            taken no previous anticoagulant or antiplatelet                            agents. ASA Grade Assessment: II - A patient with                            mild systemic disease. After reviewing the risks                            and benefits, the patient was deemed in  satisfactory condition to undergo the procedure.                           After obtaining informed consent, the endoscope was                            passed under direct vision. Throughout the                            procedure, the patient's blood pressure, pulse, and                            oxygen saturations were monitored continuously. The                            Endoscope was introduced  through the mouth, and                            advanced to the second part of duodenum. The upper                            GI endoscopy was accomplished without difficulty.                            The patient tolerated the procedure well. Scope In: Scope Out: Findings:                 Evidence of a Transoral Incisionless Fundoplication                            was found at the gastroesophageal junction. The                            wrap appeared tight and appropriate. The LES was                            appropriately narrowed. Several Serofuse fasteners                            were noted along with a few appropriate                            post-operative scars in the lower esophagus. This                            was traversed.                           The upper third of the esophagus, middle third of                            the esophagus and lower third of the esophagus were                            normal.  The BRAVO capsule with delivery system was                            introduced through the mouth and advanced into the                            esophagus, such that the BRAVO pH capsule was                            positioned 34 cm from the incisors, which was 6 cm                            proximal to the GE junction. Suction was applied to                            the well of the BRAVO pH capsule to suck in the                            adjacent mucosa of the esophagus using the external                            vacuum pump set at a minimum vacuum pressure of 550                            mmHg for 45 seconds. The BRAVO pH capsule was then                            deployed by depressing the plunger on top of the                            handle to advance the locking pin into the mucosa,                            thereby attaching the capsule to the esophagus. The                            plunger was then rotated a quarter turn clockwise                             to release the capsule from the delivery system.                            The delivery system was then withdrawn. Endoscopy                            was utilized for probe placement and diagnostic                            evaluation. Estimated blood loss: none. The  endoscope was reinserted with confirmation of                            appropriate positioning.                           A few small sessile polyps with no stigmata of                            recent bleeding were found in the gastric fundus                            and in the gastric body. Appearance and location                            consistent with previously confirmed fundic gland                            polyps.                           The incisura, gastric antrum and pylorus were                            normal.                           The duodenal bulb, first portion of the duodenum                            and second portion of the duodenum were normal. Complications:            No immediate complications. Estimated Blood Loss:     Estimated blood loss: none. Impression:               - A Transoral Incisionless Fundoplication was                            found. The wrap appears tight.                           - Normal upper third of esophagus, middle third of                            esophagus and lower third of esophagus.                           - A few gastric polyps.                           - Normal incisura, antrum and pylorus.                           - Normal duodenal bulb, first portion of the  duodenum and second portion of the duodenum.                           - The BRAVO pH capsule was positioned 34 cm from                            the incisors, which was 6 cm proximal to the GE                            junction.                           - No specimens collected. Recommendation:            - Patient has a contact number available for                            emergencies. The signs and symptoms of potential                            delayed complications were discussed with the                            patient. Return to normal activities tomorrow.                            Written discharge instructions were provided to the                            patient.                           - Resume diet per Bravo protocol.                           - Continue present medications.                           - Return to GI clinic after studies are complete to                            review results and ongoing treatment plan.                           - Remain off all acid suppression medications for                            the duration of the study. Gerrit Heck, MD 10/17/2019 8:19:20 AM

## 2019-10-17 NOTE — Progress Notes (Signed)
Patient consents to observer being present for procedure.   

## 2019-10-20 ENCOUNTER — Telehealth: Payer: Self-pay | Admitting: *Deleted

## 2019-10-20 NOTE — Telephone Encounter (Addendum)
Results for Bravo study have been uploaded and are ready to be reviewed.  Diary scanned in patient's chart and hardcopy placed in Dr. Woodward Ku inbox.

## 2019-10-21 ENCOUNTER — Telehealth: Payer: Self-pay

## 2019-10-21 NOTE — Telephone Encounter (Signed)
°  Follow up Call-  Call back number 10/17/2019 12/25/2018  Post procedure Call Back phone  # [phone]  Permission to leave phone message Yes Yes     Patient questions:  Do you have a fever, pain , or abdominal swelling? No. Pain Score  0 *  Have you tolerated food without any problems? Yes.    Have you been able to return to your normal activities? Yes.    Do you have any questions about your discharge instructions: Diet   No. Medications  No. Follow up visit  No.  Do you have questions or concerns about your Care? No.  Actions: * If pain score is 4 or above: No action needed, pain <4.  1. Have you developed a fever since your procedure? no  2.   Have you had an respiratory symptoms (SOB or cough) since your procedure? no  3.   Have you tested positive for COVID 19 since your procedure no  4.   Have you had any family members/close contacts diagnosed with the COVID 19 since your procedure? no   If yes to any of these questions please route to Joylene John, RN and Joella Prince, RN

## 2019-10-21 NOTE — Telephone Encounter (Signed)
No answer, left message to call back later today, B.Anyelin Mogle RN. 

## 2019-10-21 NOTE — Telephone Encounter (Signed)
The study is negative, no evidence of significant GERD.  IT is helping to add printer, report will be scanned in soon. Thanks

## 2019-10-22 ENCOUNTER — Ambulatory Visit: Payer: BC Managed Care – PPO | Admitting: Psychiatry

## 2019-10-22 DIAGNOSIS — R12 Heartburn: Secondary | ICD-10-CM | POA: Diagnosis not present

## 2019-10-22 DIAGNOSIS — R0789 Other chest pain: Secondary | ICD-10-CM

## 2019-10-22 NOTE — Telephone Encounter (Signed)
Thank you, very much appreciate the quick turnaround. Will call her today with the results.

## 2019-10-23 NOTE — Telephone Encounter (Signed)
Spoke to patient to inform her of recent BRAVO results. She still has all the symptoms previously discussed with you. She was wondering what her next plan of care is. She states that something is wrong with her,and was wondering if there was anything else you would recommend.

## 2019-10-23 NOTE — Telephone Encounter (Signed)
Can you please contact Breanna Young to let her know the results of the Bravo study were normal. There was no increased esophageal acid exposure through the duration of the study, indicative of an appropriate, functional TIF wrap. Her symptoms are not from esophageal reflux (either acid or non-acid), which should provide reassurance that her reflux is being controlled by surgical means alone. I would typically consider adding a neuromodulating medication, but she is already taking a few medications that provide this benefit and I do not want to interfere by adding a drug of the same class or overlapping class. Otherwise, no need to resume acid suppression therapy at this juncture. Thank you.

## 2019-10-23 NOTE — Telephone Encounter (Signed)
Lmom for patient to call back 

## 2019-10-29 DIAGNOSIS — N83292 Other ovarian cyst, left side: Secondary | ICD-10-CM | POA: Diagnosis not present

## 2019-10-29 DIAGNOSIS — Z30431 Encounter for routine checking of intrauterine contraceptive device: Secondary | ICD-10-CM | POA: Diagnosis not present

## 2019-11-05 ENCOUNTER — Other Ambulatory Visit: Payer: Self-pay

## 2019-11-05 ENCOUNTER — Encounter: Payer: Self-pay | Admitting: Physician Assistant

## 2019-11-05 ENCOUNTER — Ambulatory Visit (INDEPENDENT_AMBULATORY_CARE_PROVIDER_SITE_OTHER): Payer: BC Managed Care – PPO | Admitting: Physician Assistant

## 2019-11-05 VITALS — BP 100/66 | HR 95 | Temp 98.4°F | Ht 65.5 in | Wt 136.5 lb

## 2019-11-05 DIAGNOSIS — Z1159 Encounter for screening for other viral diseases: Secondary | ICD-10-CM

## 2019-11-05 DIAGNOSIS — K2100 Gastro-esophageal reflux disease with esophagitis, without bleeding: Secondary | ICD-10-CM | POA: Diagnosis not present

## 2019-11-05 DIAGNOSIS — Z0000 Encounter for general adult medical examination without abnormal findings: Secondary | ICD-10-CM | POA: Diagnosis not present

## 2019-11-05 DIAGNOSIS — Z136 Encounter for screening for cardiovascular disorders: Secondary | ICD-10-CM | POA: Diagnosis not present

## 2019-11-05 DIAGNOSIS — F332 Major depressive disorder, recurrent severe without psychotic features: Secondary | ICD-10-CM | POA: Diagnosis not present

## 2019-11-05 DIAGNOSIS — Z79899 Other long term (current) drug therapy: Secondary | ICD-10-CM

## 2019-11-05 DIAGNOSIS — E559 Vitamin D deficiency, unspecified: Secondary | ICD-10-CM

## 2019-11-05 DIAGNOSIS — Z114 Encounter for screening for human immunodeficiency virus [HIV]: Secondary | ICD-10-CM

## 2019-11-05 DIAGNOSIS — Z13220 Encounter for screening for lipoid disorders: Secondary | ICD-10-CM

## 2019-11-05 NOTE — Patient Instructions (Signed)
It was great to see you!  I'll be in touch with the plan for your GI work-up, hang in there!  Please go to the lab for blood work.   Our office will call you with your results unless you have chosen to receive results via MyChart.  If your blood work is normal we will follow-up each year for physicals and as scheduled for chronic medical problems.  If anything is abnormal we will treat accordingly and get you in for a follow-up.  Take care,  [facility] Maintenance, Female Adopting a healthy lifestyle and getting preventive care are important in promoting health and wellness. Ask your health care provider about:  The right schedule for you to have regular tests and exams.  Things you can do on your own to prevent diseases and keep yourself healthy. What should I know about diet, weight, and exercise? Eat a healthy diet   Eat a diet that includes plenty of vegetables, fruits, low-fat dairy products, and lean protein.  Do not eat a lot of foods that are high in solid fats, added sugars, or sodium. Maintain a healthy weight Body mass index (BMI) is used to identify weight problems. It estimates body fat based on height and weight. Your health care provider can help determine your BMI and help you achieve or maintain a healthy weight. Get regular exercise Get regular exercise. This is one of the most important things you can do for your health. Most adults should:  Exercise for at least 150 minutes each week. The exercise should increase your heart rate and make you sweat (moderate-intensity exercise).  Do strengthening exercises at least twice a week. This is in addition to the moderate-intensity exercise.  Spend less time sitting. Even light physical activity can be beneficial. Watch cholesterol and blood lipids Have your blood tested for lipids and cholesterol at 40 years of age, then have this test every 5 years. Have your cholesterol levels checked more often  if:  Your lipid or cholesterol levels are high.  You are older than 40 years of age.  You are at high risk for heart disease. What should I know about cancer screening? Depending on your health history and family history, you may need to have cancer screening at various ages. This may include screening for:  Breast cancer.  Cervical cancer.  Colorectal cancer.  Skin cancer.  Lung cancer. What should I know about heart disease, diabetes, and high blood pressure? Blood pressure and heart disease  High blood pressure causes heart disease and increases the risk of stroke. This is more likely to develop in people who have high blood pressure readings, are of African descent, or are overweight.  Have your blood pressure checked: ? Every 3-5 years if you are 69-73 years of age. ? Every year if you are 13 years old or older. Diabetes Have regular diabetes screenings. This checks your fasting blood sugar level. Have the screening done:  Once every three years after age 75 if you are at a normal weight and have a low risk for diabetes.  More often and at a younger age if you are overweight or have a high risk for diabetes. What should I know about preventing infection? Hepatitis B If you have a higher risk for hepatitis B, you should be screened for this virus. Talk with your health care provider to find out if you are at risk for hepatitis B infection. Hepatitis C Testing is recommended for:  Everyone  born from [birth year] through 1966.  Anyone with known risk factors for hepatitis C. Sexually transmitted infections (STIs)  Get screened for STIs, including gonorrhea and chlamydia, if: ? You are sexually active and are younger than 40 years of age. ? You are older than 40 years of age and your health care provider tells you that you are at risk for this type of infection. ? Your sexual activity has changed since you were last screened, and you are at increased risk for chlamydia or  gonorrhea. Ask your health care provider if you are at risk.  Ask your health care provider about whether you are at high risk for HIV. Your health care provider may recommend a prescription medicine to help prevent HIV infection. If you choose to take medicine to prevent HIV, you should first get tested for HIV. You should then be tested every 3 months for as long as you are taking the medicine. Pregnancy  If you are about to stop having your period (premenopausal) and you may become pregnant, seek counseling before you get pregnant.  Take 400 to 800 micrograms (mcg) of folic acid every day if you become pregnant.  Ask for birth control (contraception) if you want to prevent pregnancy. Osteoporosis and menopause Osteoporosis is a disease in which the bones lose minerals and strength with aging. This can result in bone fractures. If you are 26 years old or older, or if you are at risk for osteoporosis and fractures, ask your health care provider if you should:  Be screened for bone loss.  Take a calcium or vitamin D supplement to lower your risk of fractures.  Be given hormone replacement therapy (HRT) to treat symptoms of menopause. Follow these instructions at home: Lifestyle  Do not use any products that contain nicotine or tobacco, such as cigarettes, e-cigarettes, and chewing tobacco. If you need help quitting, ask your health care provider.  Do not use street drugs.  Do not share needles.  Ask your health care provider for help if you need support or information about quitting drugs. Alcohol use  Do not drink alcohol if: ? Your health care provider tells you not to drink. ? You are pregnant, may be pregnant, or are planning to become pregnant.  If you drink alcohol: ? Limit how much you use to 0-1 drink a day. ? Limit intake if you are breastfeeding.  Be aware of how much alcohol is in your drink. In the [place] one drink equals one 12 oz bottle of beer (355 mL), one 5 oz  glass of wine (148 mL), or one 1 oz glass of hard liquor (44 mL). General instructions  Schedule regular health, dental, and eye exams.  Stay current with your vaccines.  Tell your health care provider if: ? You often feel depressed. ? You have ever been abused or do not feel safe at home. Summary  Adopting a healthy lifestyle and getting preventive care are important in promoting health and wellness.  Follow your health care provider's instructions about healthy diet, exercising, and getting tested or screened for diseases.  Follow your health care provider's instructions on monitoring your cholesterol and blood pressure. This information is not intended to replace advice given to you by your health care provider. Make sure you discuss any questions you have with your health care provider. Document Revised: 12/26/2017 Document Reviewed: 12/26/2017 Elsevier Patient Education  2020 [employer].

## 2019-11-05 NOTE — Progress Notes (Signed)
Breanna Young is a 39 y.o. female here to establish care.  I acted as a Education administrator for [employer], PA-C Anselmo Pickler, LPN   History of Present Illness:   Chief Complaint  Patient presents with  . Establish Care  . Annual Exam   Acute Concerns: GERD with esophagitis -- initially evaluated by Dr. Henrene Pastor with [facility] 12/2018 for GERD and IBS with worsening GERD symptoms. 10 year hx of GERD overall controlled with PPIs.  12/25/18 -- normal endoscopy 12/31/18 -- esophagram shows reflux, referred to Dr. Bryan Lemma for TIF 03/26/19 -- TIF, esophageal dilation with Dr. Bryan Lemma Since her surgery she had some ongoing issues. Had choking in her sleep, was started on pepcid. She has had sharp pain radiating to her chest. She is mostly eating liquids in fear of advancing her diet. Can be doubled over in pain at times. Feels like this is significantly affecting her quality of life.  10/17/19 -- normal esophagram  Chronic Issues: Severe major depression -- currently on klonopin 1 mg daily, lamictal 200 mg daily,  viibryd 30 mg daily. Followed/managed by [name] psych NP. Migraines -- uses imitrex prn -- very rare. Since being on Topiramate daily had had good control of migraines.  Health Maintenance: Immunizations -- UTD Colonoscopy -- N/A Mammogram -- UTD, done 10/09/19 Normal PAP -- UTD, done 08/20/2019 NILM / HPV Neg Bone Density -- N/A Diet -- very limited due to GI symptoms Exercise -- limited due to abdominal pain; walks her dogs Weight -- Weight: 136 lb 8 oz (61.9 kg) ; was 126 lb after her TIFF Mood -- feels overall stable with regimen Alcohol -- very rarely Tobacco -- none  Depression screen PHQ 2/9 11/05/2019  Decreased Interest 1  Down, Depressed, Hopeless 1  PHQ - 2 Score 2  Altered sleeping 2  Tired, decreased energy 2  Change in appetite 2  Feeling bad or failure about yourself  2  Trouble concentrating 2  Moving slowly or fidgety/restless 1  Suicidal thoughts 0   PHQ-9 Score 13  Difficult doing work/chores Somewhat difficult    GAD 7 : Generalized Anxiety Score 11/05/2019  Nervous, Anxious, on Edge 3  Control/stop worrying 3  Worry too much - different things 2  Trouble relaxing 3  Restless 2  Easily annoyed or irritable 2  Afraid - awful might happen 1  Total GAD 7 Score 16  Anxiety Difficulty Somewhat difficult     Other providers/specialists: Patient Care Team: Inda Coke, [place] as PCP - General (Physician Assistant)   Past Medical History:  Diagnosis Date  . Anxiety   . Depression   . GERD (gastroesophageal reflux disease)   . Hiatal hernia   . IBS (irritable bowel syndrome)      Social History   Tobacco Use  . Smoking status: Never Smoker  . Smokeless tobacco: Never Used  Vaping Use  . Vaping Use: Never used  Substance Use Topics  . Alcohol use: Not Currently    Alcohol/week: 2.0 standard drinks    Types: 2 Glasses of wine per week  . Drug use: Never    Past Surgical History:  Procedure Laterality Date  . ESOPHAGEAL DILATION  03/26/2019   Procedure: ESOPHAGEAL DILATION;  Surgeon: Lavena Bullion, DO;  Location: [facility];  Service: Gastroenterology;;  . ESOPHAGOGASTRODUODENOSCOPY (EGD) WITH PROPOFOL N/A 03/26/2019   Procedure: ESOPHAGOGASTRODUODENOSCOPY (EGD) WITH PROPOFOL;  Surgeon: Lavena Bullion, DO;  Location: [facility];  Service: Gastroenterology;  Laterality: N/A;  Food Bolus in stomach  removed  . TONSILLECTOMY    . TRANSORAL INCISIONLESS FUNDOPLICATION N/A 1/63/8466   Procedure: TRANSORAL INCISIONLESS FUNDOPLICATION;  Surgeon: Lavena Bullion, DO;  Location: [facility];  Service: Gastroenterology;  Laterality: N/A;    Family History  Problem Relation Age of Onset  . Depression Mother   . Diabetes Mother   . GER disease Mother        said she had it for a long time  . Heart attack Father   . Diabetes Father   . Depression Maternal Grandmother   . Endometrial cancer Maternal  Grandmother   . Dementia Maternal Grandfather   . Diabetes Maternal Grandfather   . Dementia Paternal Grandmother   . Colon cancer Neg Hx   . Breast cancer Neg Hx   . Esophageal cancer Neg Hx   . Stomach cancer Neg Hx   . Rectal cancer Neg Hx     Allergies  Allergen Reactions  . Aluminum-Containing Compounds     Rash/itchy  . Avelox [Moxifloxacin Hcl In Nacl] Other (See Comments)    Unknown reaction type (possible rash reaction)   . Dexlansoprazole Other (See Comments)    Unknown reaction type (possible rash reaction)  . Doxazosin Other (See Comments)    Dizziness/faint  . Reglan [Metoclopramide]     Hallucinations and a contraindication to her viibyrd     Current Medications:   Current Outpatient Medications:  .  clonazePAM (KLONOPIN) 1 MG tablet, TAKE 1 TABLET BY MOUTH TWICE A DAY THEN 1 TAB AS NEEDED FOR ANXIETY, Disp: 75 tablet, Rfl: 2 .  docusate sodium (COLACE) 100 MG capsule, Take 100 mg by mouth daily as needed for mild constipation. , Disp: , Rfl:  .  lamoTRIgine (LAMICTAL) 200 MG tablet, Take 1 tablet (200 mg total) by mouth daily., Disp: 90 tablet, Rfl: 0 .  levonorgestrel (MIRENA) 20 MCG/24HR IUD, 1 each by Intrauterine route once. , Disp: , Rfl:  .  ondansetron (ZOFRAN) 4 MG tablet, Take 1 tablet (4 mg total) by mouth daily as needed for nausea or vomiting., Disp: 30 tablet, Rfl: 1 .  promethazine (PHENERGAN) 25 MG tablet, Take 1 tablet (25 mg total) by mouth every 6 (six) hours as needed for nausea or vomiting., Disp: 30 tablet, Rfl: 0 .  simethicone (GAS-X) 80 MG chewable tablet, Chew 1 tablet (80 mg total) by mouth 4 (four) times daily as needed for flatulence., Disp: 100 tablet, Rfl: 2 .  SUMAtriptan (IMITREX) 50 MG tablet, TAKE 1 TAB EVERY 2 HRS AS NEEDED FOR MIGRAINE. MAY REPEAT IN 2 HOURS IF HEADACHE PERSISTS OR RECURS., Disp: 8 tablet, Rfl: 1 .  tiZANidine (ZANAFLEX) 4 MG tablet, Take 4 mg by mouth every 6 (six) hours as needed (spasms.). , Disp: , Rfl:  .   topiramate (TOPAMAX) 25 MG tablet, Take 1 tablet (25 mg total) by mouth at bedtime., Disp: 90 tablet, Rfl: 1 .  traZODone (DESYREL) 100 MG tablet, Take 1.5 tabs po QHS prn insomnia, Disp: 135 tablet, Rfl: 1 .  Vilazodone HCl (VIIBRYD) 20 MG TABS, Take 1.5 tablets (30 mg total) by mouth daily., Disp: 135 tablet, Rfl: 1   Review of Systems:   Review of Systems  Constitutional: Negative for chills, fever, malaise/fatigue and weight loss.  HENT: Negative for hearing loss, sinus pain and sore throat.   Respiratory: Negative for cough and hemoptysis.   Cardiovascular: Negative for chest pain, palpitations, leg swelling and PND.  Gastrointestinal: Positive for abdominal pain, heartburn and nausea. Negative for  constipation, diarrhea and vomiting.  Genitourinary: Negative for dysuria, frequency and urgency.  Musculoskeletal: Negative for back pain, myalgias and neck pain.  Skin: Negative for itching and rash.  Neurological: Negative for dizziness, tingling, seizures and headaches.  Endo/Heme/Allergies: Negative for polydipsia.  Psychiatric/Behavioral: Negative for depression. The patient is nervous/anxious.       Vitals:   Vitals:   11/05/19 1348  BP: 100/66  Pulse: 95  Temp: 98.4 F (36.9 C)  TempSrc: Temporal  SpO2: 98%  Weight: 136 lb 8 oz (61.9 kg)  Height: 5' 5.5" (1.664 m)      Body mass index is 22.37 kg/m.  Physical Exam:   Physical Exam Vitals and nursing note reviewed.  Constitutional:      General: She is not in acute distress.    Appearance: Normal appearance. She is well-developed. She is not ill-appearing or toxic-appearing.  HENT:     Head: Normocephalic and atraumatic.     Right Ear: Tympanic membrane, ear canal and external ear normal. Tympanic membrane is not erythematous, retracted or bulging.     Left Ear: Tympanic membrane, ear canal and external ear normal. Tympanic membrane is not erythematous, retracted or bulging.  Eyes:     General: Lids are normal.      Conjunctiva/sclera: Conjunctivae normal.     Pupils: Pupils are equal, round, and reactive to light.  Neck:     Trachea: Trachea normal.  Cardiovascular:     Rate and Rhythm: Normal rate and regular rhythm.     Heart sounds: Normal heart sounds, S1 normal and S2 normal.  Pulmonary:     Effort: Pulmonary effort is normal. No tachypnea or respiratory distress.     Breath sounds: Normal breath sounds. No decreased breath sounds, wheezing, rhonchi or rales.  Abdominal:     General: Bowel sounds are normal.     Palpations: Abdomen is soft.     Tenderness: There is generalized abdominal tenderness.  Musculoskeletal:        General: Normal range of motion.     Cervical back: Full passive range of motion without pain.  Lymphadenopathy:     Cervical: No cervical adenopathy.  Skin:    General: Skin is warm and dry.  Neurological:     Mental Status: She is alert.     GCS: GCS eye subscore is 4. GCS verbal subscore is 5. GCS motor subscore is 6.     Cranial Nerves: No cranial nerve deficit.     Sensory: No sensory deficit.     Deep Tendon Reflexes: Reflexes are normal and symmetric.  Psychiatric:        Mood and Affect: Affect is tearful.        Speech: Speech normal.        Behavior: Behavior normal. Behavior is cooperative.       Assessment and Plan:   Migdalia was seen today for establish care and annual exam.  Diagnoses and all orders for this visit:  Routine physical examination Today patient counseled on age appropriate routine health concerns for screening and prevention, each reviewed and up to date or declined. Immunizations reviewed and up to date or declined. Labs ordered and reviewed. Risk factors for depression reviewed and negative. Hearing function and visual acuity are intact. ADLs screened and addressed as needed. Functional ability and level of safety reviewed and appropriate. Education, counseling and referrals performed based on assessed risks today. Patient  provided with a copy of personalized plan for preventive services.  Gastroesophageal  reflux disease with esophagitis without hemorrhage Long discussion regarding this. She is very upset about not understanding what is causing her symptoms, and feels discouraged that her TIF didn't provide significant improvement of results. She is tearful discussing this and is wondering if a second opinion is warranted. Will reach out to [facility] providers to see if there is a way to provider further assistance to patient or if outside referral is needed. -     CBC with Differential/Platelet; Future -     Comprehensive metabolic panel; Future -     Comprehensive metabolic panel -     CBC with Differential/Platelet  Vitamin D deficiency Her psych NP is requesting updated Vit D level. Will check and provide recommendations as indicated. -     VITAMIN D 25 Hydroxy (Vit-D Deficiency, Fractures); Future -     VITAMIN D 25 Hydroxy (Vit-D Deficiency, Fractures)  Long term use of drug Her psych NP is requesting updated B12 level. Will check and provide recommendations as indicated. -     Vitamin B12; Future -     Vitamin B12  Screening for HIV (human immunodeficiency virus) -     HIV Antibody (routine testing w rflx); Future -     HIV Antibody (routine testing w rflx)  Severe recurrent major depression without psychotic features ([place]) Well-controlled per patient. Management per psych.  Encounter for screening for other viral diseases -     Hepatitis C antibody; Future -     Hepatitis C antibody  Encounter for lipid screening for cardiovascular disease -     Lipid panel; Future -     Lipid panel  CMA or LPN served as scribe during this visit. History, Physical, and Plan performed by medical provider. The above documentation has been reviewed and is accurate and complete.  In addition to dedicated time for her CPE, approximate additional time discussing GI issues with patient today was >25 minutes which  consisted of chart review, discussing diagnosis, work up, treatment answering questions and documentation.  Inda Coke, PA-C

## 2019-11-06 LAB — CBC WITH DIFFERENTIAL/PLATELET
Absolute Monocytes: 377 cells/uL (ref 200–950)
Basophils Absolute: 59 cells/uL (ref 0–200)
Basophils Relative: 0.9 %
Eosinophils Absolute: 221 cells/uL (ref 15–500)
Eosinophils Relative: 3.4 %
HCT: 41.9 % (ref 35.0–45.0)
Hemoglobin: 14.1 g/dL (ref 11.7–15.5)
Lymphs Abs: 2015 cells/uL (ref 850–3900)
MCH: 32.4 pg (ref 27.0–33.0)
MCHC: 33.7 g/dL (ref 32.0–36.0)
MCV: 96.3 fL (ref 80.0–100.0)
MPV: 10.9 fL (ref 7.5–12.5)
Monocytes Relative: 5.8 %
Neutro Abs: 3829 cells/uL (ref 1500–7800)
Neutrophils Relative %: 58.9 %
Platelets: 185 10*3/uL (ref 140–400)
RBC: 4.35 10*6/uL (ref 3.80–5.10)
RDW: 11.7 % (ref 11.0–15.0)
Total Lymphocyte: 31 %
WBC: 6.5 10*3/uL (ref 3.8–10.8)

## 2019-11-06 LAB — COMPREHENSIVE METABOLIC PANEL
AG Ratio: 2 (calc) (ref 1.0–2.5)
ALT: 10 U/L (ref 6–29)
AST: 19 U/L (ref 10–30)
Albumin: 4.7 g/dL (ref 3.6–5.1)
Alkaline phosphatase (APISO): 53 U/L (ref 31–125)
BUN: 11 mg/dL (ref 7–25)
CO2: 26 mmol/L (ref 20–32)
Calcium: 9.3 mg/dL (ref 8.6–10.2)
Chloride: 104 mmol/L (ref 98–110)
Creat: 0.86 mg/dL (ref 0.50–1.10)
Globulin: 2.3 g/dL (calc) (ref 1.9–3.7)
Glucose, Bld: 90 mg/dL (ref 65–99)
Potassium: 4.5 mmol/L (ref 3.5–5.3)
Sodium: 138 mmol/L (ref 135–146)
Total Bilirubin: 0.7 mg/dL (ref 0.2–1.2)
Total Protein: 7 g/dL (ref 6.1–8.1)

## 2019-11-06 LAB — LIPID PANEL
Cholesterol: 199 mg/dL (ref <200)
HDL: 90 mg/dL (ref >=50)
LDL Cholesterol (Calc): 92 mg/dL (calc)
Non-HDL Cholesterol (Calc): 109 mg/dL (calc) (ref <130)
Total CHOL/HDL Ratio: 2.2 (calc) (ref <5.0)
Triglycerides: 79 mg/dL (ref <150)

## 2019-11-06 LAB — VITAMIN D 25 HYDROXY (VIT D DEFICIENCY, FRACTURES): Vit D, 25-Hydroxy: 20 ng/mL — ABNORMAL LOW (ref 30–100)

## 2019-11-06 LAB — HEPATITIS C ANTIBODY
Hepatitis C Ab: NONREACTIVE
SIGNAL TO CUT-OFF: 0.01 (ref <1.00)

## 2019-11-06 LAB — VITAMIN B12: Vitamin B-12: 354 pg/mL (ref 200–1100)

## 2019-11-06 LAB — HIV ANTIBODY (ROUTINE TESTING W REFLEX): HIV 1&2 Ab, 4th Generation: NONREACTIVE

## 2019-11-07 ENCOUNTER — Ambulatory Visit: Payer: BC Managed Care – PPO | Admitting: Physician Assistant

## 2019-11-07 ENCOUNTER — Telehealth: Payer: Self-pay

## 2019-11-07 NOTE — Telephone Encounter (Signed)
Please see Triage note. Pt is scheduled at 11:30.

## 2019-11-07 NOTE — Telephone Encounter (Signed)
Nurse Assessment Nurse: Genelle Gather RN, Magda Paganini Date/Time (Eastern Time): 11/06/2019 4:07:01 PM Confirm and document reason for call. If symptomatic, describe symptoms. ---Caller states she has worsening pain in her abd where she has had previous surgery. She had a Transoral fundoplication. She states she has pain daily but the pain is worsening today. She is not currently having any trouble breathing. Does the patient have any new or worsening symptoms? ---Yes Will a triage be completed? ---Yes Related visit to physician within the last 2 weeks? ---Yes Does the PT have any chronic conditions? (i.e. diabetes, asthma, this includes High risk factors for pregnancy, etc.) ---No Is the patient pregnant or possibly pregnant? (Ask all females between the ages of 30-55) ---No Is this a behavioral health or substance abuse call? ---No Guidelines Guideline Title Affirmed Question Affirmed Notes Nurse Date/Time (Eastern Time) Chest Pain [1] Chest pain lasts > 5 minutes AND [2] occurred > 3 days ago (72 hours) AND [3] NO chest pain or cardiac symptoms now [place], RN, Magda Paganini 11/06/2019 4:12:19 PM Disp. Time Eilene Ghazi Time) Disposition Final User 11/06/2019 4:05:18 PM Send to Urgent Queue [place], Jazz Rogala PLEASE NOTE: All timestamps contained within this report are represented as [place] Standard Time. CONFIDENTIALTY NOTICE: This fax transmission is intended only for the addressee. It contains information that is legally privileged, confidential or otherwise protected from use or disclosure. If you are not the intended recipient, you are strictly prohibited from reviewing, disclosing, copying using or disseminating any of this information or taking any action in reliance on or regarding this information. If you have received this fax in error, please notify us immediately by telephone so that we can arrange for its return to [place]. Phone: [phone], Toll-Free: [phone], Fax: [phone] Page:  2 of 2 Call Id: [id number] 11/06/2019 4:23:54 PM See PCP within 24 Hours Yes Genelle Gather, RN, Christa See Disagree/Comply Comply Caller Understands Yes PreDisposition Call Doctor Care Advice Given Per Guideline SEE PCP WITHIN 24 HOURS: * IF OFFICE WILL BE OPEN: You need to be examined within the next 24 hours. Call your doctor (or NP/PA) when the office opens and make an appointment. CALL BACK IF: * Difficulty breathing or unusual sweating occurs * You become worse Comments User: Shelly Coss, RN Date/Time Eilene Ghazi Time): 11/06/2019 4:24:37 PM Pt made an appt with office for Friday @ 11:30. Referrals REFERRED TO PCP OFFIC

## 2019-11-08 ENCOUNTER — Other Ambulatory Visit: Payer: Self-pay | Admitting: Psychiatry

## 2019-11-08 DIAGNOSIS — G43911 Migraine, unspecified, intractable, with status migrainosus: Secondary | ICD-10-CM

## 2019-11-09 ENCOUNTER — Encounter: Payer: Self-pay | Admitting: Physician Assistant

## 2019-11-12 ENCOUNTER — Ambulatory Visit (INDEPENDENT_AMBULATORY_CARE_PROVIDER_SITE_OTHER): Payer: BC Managed Care – PPO | Admitting: Psychiatry

## 2019-11-12 ENCOUNTER — Encounter (HOSPITAL_COMMUNITY): Payer: Self-pay

## 2019-11-12 ENCOUNTER — Emergency Department (HOSPITAL_COMMUNITY): Payer: BC Managed Care – PPO

## 2019-11-12 ENCOUNTER — Encounter: Payer: Self-pay | Admitting: Psychiatry

## 2019-11-12 ENCOUNTER — Other Ambulatory Visit: Payer: Self-pay

## 2019-11-12 ENCOUNTER — Emergency Department (HOSPITAL_COMMUNITY)
Admission: EM | Admit: 2019-11-12 | Discharge: 2019-11-12 | Disposition: A | Discharge status: Home / Self Care | Payer: BC Managed Care – PPO | Attending: Emergency Medicine | Admitting: Emergency Medicine

## 2019-11-12 DIAGNOSIS — R42 Dizziness and giddiness: Secondary | ICD-10-CM | POA: Diagnosis not present

## 2019-11-12 DIAGNOSIS — R1084 Generalized abdominal pain: Secondary | ICD-10-CM | POA: Diagnosis not present

## 2019-11-12 DIAGNOSIS — R000 Tachycardia, unspecified: Secondary | ICD-10-CM | POA: Diagnosis not present

## 2019-11-12 DIAGNOSIS — S299XXA Unspecified injury of thorax, initial encounter: Secondary | ICD-10-CM | POA: Diagnosis not present

## 2019-11-12 DIAGNOSIS — F4310 Post-traumatic stress disorder, unspecified: Secondary | ICD-10-CM

## 2019-11-12 DIAGNOSIS — K219 Gastro-esophageal reflux disease without esophagitis: Secondary | ICD-10-CM | POA: Insufficient documentation

## 2019-11-12 DIAGNOSIS — R0789 Other chest pain: Secondary | ICD-10-CM | POA: Insufficient documentation

## 2019-11-12 DIAGNOSIS — R52 Pain, unspecified: Secondary | ICD-10-CM | POA: Diagnosis not present

## 2019-11-12 DIAGNOSIS — R110 Nausea: Secondary | ICD-10-CM | POA: Diagnosis not present

## 2019-11-12 DIAGNOSIS — R911 Solitary pulmonary nodule: Secondary | ICD-10-CM | POA: Diagnosis not present

## 2019-11-12 DIAGNOSIS — F3342 Major depressive disorder, recurrent, in full remission: Secondary | ICD-10-CM

## 2019-11-12 DIAGNOSIS — F411 Generalized anxiety disorder: Secondary | ICD-10-CM | POA: Diagnosis not present

## 2019-11-12 DIAGNOSIS — K449 Diaphragmatic hernia without obstruction or gangrene: Secondary | ICD-10-CM | POA: Diagnosis not present

## 2019-11-12 DIAGNOSIS — N83292 Other ovarian cyst, left side: Secondary | ICD-10-CM | POA: Diagnosis not present

## 2019-11-12 DIAGNOSIS — R1010 Upper abdominal pain, unspecified: Secondary | ICD-10-CM | POA: Insufficient documentation

## 2019-11-12 DIAGNOSIS — S3991XA Unspecified injury of abdomen, initial encounter: Secondary | ICD-10-CM | POA: Diagnosis not present

## 2019-11-12 DIAGNOSIS — R079 Chest pain, unspecified: Secondary | ICD-10-CM | POA: Diagnosis not present

## 2019-11-12 DIAGNOSIS — R55 Syncope and collapse: Secondary | ICD-10-CM | POA: Diagnosis not present

## 2019-11-12 LAB — CBC WITH DIFFERENTIAL/PLATELET
Abs Immature Granulocytes: 0.03 10*3/uL (ref 0.00–0.07)
Basophils Absolute: 0.1 10*3/uL (ref 0.0–0.1)
Basophils Relative: 1 %
Eosinophils Absolute: 0.1 10*3/uL (ref 0.0–0.5)
Eosinophils Relative: 1 %
HCT: 43 % (ref 36.0–46.0)
Hemoglobin: 14.5 g/dL (ref 12.0–15.0)
Immature Granulocytes: 0 %
Lymphocytes Relative: 27 %
Lymphs Abs: 2.6 10*3/uL (ref 0.7–4.0)
MCH: 32.6 pg (ref 26.0–34.0)
MCHC: 33.7 g/dL (ref 30.0–36.0)
MCV: 96.6 fL (ref 80.0–100.0)
Monocytes Absolute: 0.6 10*3/uL (ref 0.1–1.0)
Monocytes Relative: 6 %
Neutro Abs: 6.2 10*3/uL (ref 1.7–7.7)
Neutrophils Relative %: 65 %
Platelets: 225 10*3/uL (ref 150–400)
RBC: 4.45 MIL/uL (ref 3.87–5.11)
RDW: 12.1 % (ref 11.5–15.5)
WBC: 9.6 10*3/uL (ref 4.0–10.5)
nRBC: 0 % (ref 0.0–0.2)

## 2019-11-12 LAB — I-STAT CHEM 8, ED
BUN: 13 mg/dL (ref 6–20)
Calcium, Ion: 1.23 mmol/L (ref 1.15–1.40)
Chloride: 103 mmol/L (ref 98–111)
Creatinine, Ser: 0.8 mg/dL (ref 0.44–1.00)
Glucose, Bld: 92 mg/dL (ref 70–99)
HCT: 46 % (ref 36.0–46.0)
Hemoglobin: 15.6 g/dL — ABNORMAL HIGH (ref 12.0–15.0)
Potassium: 3.4 mmol/L — ABNORMAL LOW (ref 3.5–5.1)
Sodium: 140 mmol/L (ref 135–145)
TCO2: 24 mmol/L (ref 22–32)

## 2019-11-12 LAB — I-STAT BETA HCG BLOOD, ED (MC, WL, AP ONLY): I-stat hCG, quantitative: <5 m[IU]/mL (ref <5)

## 2019-11-12 MED ORDER — IBUPROFEN 600 MG PO TABS: 600.0000 mg | ORAL_TABLET | Freq: Four times a day (QID) | ORAL | 0 refills | Status: AC | PRN

## 2019-11-12 MED ORDER — IOHEXOL 300 MG/ML  SOLN
100.0000 mL | Freq: Once | INTRAMUSCULAR | Status: AC | PRN
  Administered 2019-11-12: 100 mL via INTRAVENOUS

## 2019-11-12 MED ORDER — LAMOTRIGINE 200 MG PO TABS: 200.0000 mg | ORAL_TABLET | Freq: Every day | ORAL | 0 refills | Status: DC

## 2019-11-12 MED ORDER — MORPHINE SULFATE (PF) 4 MG/ML IV SOLN
4.0000 mg | Freq: Once | INTRAVENOUS | Status: AC
  Administered 2019-11-12: 4 mg via INTRAVENOUS
  Filled 2019-11-12: qty 1

## 2019-11-12 MED ORDER — CLONAZEPAM 1 MG PO TABS: ORAL_TABLET | ORAL | 2 refills | Status: DC

## 2019-11-12 MED ORDER — CYCLOBENZAPRINE HCL 10 MG PO TABS: 10.0000 mg | ORAL_TABLET | Freq: Two times a day (BID) | ORAL | 0 refills | Status: DC | PRN

## 2019-11-12 NOTE — ED Notes (Signed)
An After Visit Summary was printed and given to the patient. Discharge instructions given and no further questions at this time.  

## 2019-11-12 NOTE — Progress Notes (Signed)
Breanna Young [id number] September 27, 1980 39 y.o.  Subjective:   Patient ID:  Breanna Young is a 39 y.o. (DOB 01/07/1981) female.  Chief Complaint:  Chief Complaint  Patient presents with  . Follow-up    Depression, anxiety, insomnia    HPI Breanna Young presents to the office today for follow-up of depression, anxiety, and insomnia. Breanna Young reports that Breanna Young continues to have residual nausea and abdominal pain since surgery and has frustration and feelings of hopelessness in response to this. Breanna Young reports that at times Breanna Young has pain so severe that it brings her to her knees. Breanna Young reports that Breanna Young dreads going to work because Breanna Young feels nauseous during the day and will have to excuse herself to vomit. Breanna Young has anxiety about GI s/s and wondering when n/v may occur. Breanna Young reports that her clients have noticed that Breanna Young does not seem to be feeling well and Breanna Young reports that it is challenging to present as ok when Breanna Young is feeling bad physically.   Breanna Young reports that her anxiety is "through the roof." Breanna Young reports that Breanna Young will feel flutters in her stomach with anxiety. Breanna Young reports that depression is "situational" in response to Gi s/s. Breanna Young reports that Breanna Young has been wanting to stay at home due to feeling bad and not knowing when vomiting is going to occur. Energy and motivation have been low. Has been physically unable to exercise. Has had low appetite and does not want to eat. Breanna Young reports that Breanna Young falls asleep without difficulty with higher dose of Trazodone. Breanna Young reports that Breanna Young awakens several times in pain. Concentration has been poor. Denies SI.    Past Psychiatric Medication Trials: Viibryd- More effective at 40 mg dose and then had more "fogginess" and some sexual side effective. Not effective at 20 mg. Trintellix- Helpful for depression Zoloft-sexual side effects. Felt more "upbeat." Lexapro-adverse reaction Wellbutrin XL-increased anxiety and felt fuzzy headed Lamictal- helpful for mood and  anxiety. Rexulti-affectivedulling, weight gain Trazodone Ativan Xanax Klonopin Doxazosin-dissociation BuSpar-adverse reaction BC Powder- Ineffective Excedrin Migraine Tizanidine- ineffective for migraines Propranolol Ubrelvy Topamax  GAD-7     Office Visit from 11/05/2019 in [place]  Total GAD-7 Score 16    PHQ2-9     Office Visit from 11/05/2019 in [facility]  PHQ-2 Total Score 2  PHQ-9 Total Score 13       Review of Systems:  Review of Systems  Gastrointestinal: Positive for abdominal pain, nausea and vomiting.  Musculoskeletal: Negative for gait problem.  Neurological: Negative for tremors.  Psychiatric/Behavioral:       Please refer to HPI    Medications: I have reviewed the patient's current medications.  Current Outpatient Medications  Medication Sig Dispense Refill  . [START ON 12/10/2019] clonazePAM (KLONOPIN) 1 MG tablet TAKE 1 TABLET BY MOUTH TWICE A DAY THEN 1 TAB AS NEEDED FOR ANXIETY 75 tablet 2  . lamoTRIgine (LAMICTAL) 200 MG tablet Take 1 tablet (200 mg total) by mouth daily. 90 tablet 0  . levonorgestrel (MIRENA) 20 MCG/24HR IUD 1 each by Intrauterine route once.     . ondansetron (ZOFRAN) 4 MG tablet Take 1 tablet (4 mg total) by mouth daily as needed for nausea or vomiting. 30 tablet 1  . promethazine (PHENERGAN) 25 MG tablet Take 1 tablet (25 mg total) by mouth every 6 (six) hours as needed for nausea or vomiting. 30 tablet 0  . simethicone (GAS-X) 80 MG chewable tablet Chew 1 tablet (80 mg total) by mouth 4 (four)  times daily as needed for flatulence. 100 tablet 2  . traZODone (DESYREL) 100 MG tablet Take 1.5 tabs po QHS prn insomnia 135 tablet 1  . Vilazodone HCl (VIIBRYD) 20 MG TABS Take 1.5 tablets (30 mg total) by mouth daily. 135 tablet 1  . cyclobenzaprine (FLEXERIL) 10 MG tablet Take 1 tablet (10 mg total) by mouth 2 (two) times daily as needed for muscle spasms. 20 tablet 0  . docusate  sodium (COLACE) 100 MG capsule Take 100 mg by mouth daily as needed for mild constipation.  (Patient not taking: Reported on 11/12/2019)    . ibuprofen (ADVIL) 600 MG tablet Take 1 tablet (600 mg total) by mouth every 6 (six) hours as needed. 30 tablet 0  . SUMAtriptan (IMITREX) 50 MG tablet TAKE 1 TAB EVERY 2 HRS AS NEEDED FOR MIGRAINE. MAY REPEAT IN 2 HOURS IF HEADACHE PERSISTS OR RECURS. 8 tablet 1  . topiramate (TOPAMAX) 25 MG tablet Take 1 tablet (25 mg total) by mouth at bedtime. 90 tablet 1   No current facility-administered medications for this visit.    Medication Side Effects: None  Allergies:  Allergies  Allergen Reactions  . Aluminum-Containing Compounds     Rash/itchy  . Avelox [Moxifloxacin Hcl In Nacl] Other (See Comments)    Unknown reaction type (possible rash reaction)   . Dexlansoprazole Other (See Comments)    Unknown reaction type (possible rash reaction)  . Doxazosin Other (See Comments)    Dizziness/faint  . Reglan [Metoclopramide]     Hallucinations and a contraindication to her viibyrd    Past Medical History:  Diagnosis Date  . Anxiety   . Depression   . GERD (gastroesophageal reflux disease)   . Hiatal hernia   . IBS (irritable bowel syndrome)     Family History  Problem Relation Age of Onset  . Depression Mother   . Diabetes Mother   . GER disease Mother        said Breanna Young had it for a long time  . Hyperlipidemia Mother   . Hypertension Mother   . Arthritis Mother   . Heart attack Father   . Diabetes Father   . Depression Maternal Grandmother   . Endometrial cancer Maternal Grandmother   . Arthritis Maternal Grandmother   . Hypertension Maternal Grandmother   . Dementia Maternal Grandfather   . Diabetes Maternal Grandfather   . Stroke Maternal Grandfather   . Dementia Paternal Grandmother   . Arthritis Paternal Grandmother   . Heart disease Paternal Grandfather   . Colon cancer Neg Hx   . Breast cancer Neg Hx   . Esophageal cancer Neg  Hx   . Stomach cancer Neg Hx   . Rectal cancer Neg Hx     Social History   Socioeconomic History  . Marital status: Single    Spouse name: Not on file  . Number of children: Not on file  . Years of education: Not on file  . Highest education level: Not on file  Occupational History  . Occupation: hair stylist  Tobacco Use  . Smoking status: Never Smoker  . Smokeless tobacco: Never Used  Vaping Use  . Vaping Use: Never used  Substance and Sexual Activity  . Alcohol use: Not Currently    Alcohol/week: 2.0 standard drinks    Types: 2 Glasses of wine per week  . Drug use: Never  . Sexual activity: Yes    Partners: Male    Birth control/protection: I.U.D.  Other Topics Concern  .  Not on file  Social History Narrative   Lives with boyfriend   Hairstylist   Social Determinants of Health   Financial Resource Strain:   . Difficulty of Paying Living Expenses: Not on file  Food Insecurity:   . Worried About Charity fundraiser in the Last Year: Not on file  . Ran Out of Food in the Last Year: Not on file  Transportation Needs:   . Lack of Transportation (Medical): Not on file  . Lack of Transportation (Non-Medical): Not on file  Physical Activity:   . Days of Exercise per Week: Not on file  . Minutes of Exercise per Session: Not on file  Stress:   . Feeling of Stress : Not on file  Social Connections:   . Frequency of Communication with Friends and Family: Not on file  . Frequency of Social Gatherings with Friends and Family: Not on file  . Attends Religious Services: Not on file  . Active Member of Clubs or Organizations: Not on file  . Attends Archivist Meetings: Not on file  . Marital Status: Not on file  Intimate Partner Violence:   . Fear of Current or Ex-Partner: Not on file  . Emotionally Abused: Not on file  . Physically Abused: Not on file  . Sexually Abused: Not on file    Past Medical History, Surgical history, Social history, and Family  history were reviewed and updated as appropriate.   Please see review of systems for further details on the patient's review from today.   Objective:   Physical Exam:  Wt 136 lb (61.7 kg)   BMI 22.29 kg/m   Physical Exam Constitutional:      General: Breanna Young is not in acute distress.    Appearance: Breanna Young is ill-appearing.  Musculoskeletal:        General: No deformity.  Neurological:     Mental Status: Breanna Young is alert and oriented to person, place, and time.     Coordination: Coordination normal.  Psychiatric:        Attention and Perception: Attention and perception normal. Breanna Young does not perceive auditory or visual hallucinations.        Mood and Affect: Mood is anxious and depressed. Affect is not labile, blunt, angry or inappropriate.        Speech: Speech normal.        Behavior: Behavior normal. Behavior is cooperative.        Thought Content: Thought content normal. Thought content is not paranoid or delusional. Thought content does not include homicidal or suicidal ideation. Thought content does not include homicidal or suicidal plan.        Cognition and Memory: Cognition and memory normal.        Judgment: Judgment normal.     Comments: Insight intact     Lab Review:     Component Value Date/Time   NA 140 11/12/2019 1843   K 3.4 (L) 11/12/2019 1843   CL 103 11/12/2019 1843   CO2 26 11/05/2019 1435   GLUCOSE 92 11/12/2019 1843   BUN 13 11/12/2019 1843   CREATININE 0.80 11/12/2019 1843   CREATININE 0.86 11/05/2019 1435   CALCIUM 9.3 11/05/2019 1435   PROT 7.0 11/05/2019 1435   AST 19 11/05/2019 1435   ALT 10 11/05/2019 1435   BILITOT 0.7 11/05/2019 1435       Component Value Date/Time   WBC 9.6 11/12/2019 1838   RBC 4.45 11/12/2019 1838   HGB 15.6 (H) 11/12/2019  1843   HCT 46.0 11/12/2019 1843   PLT 225 11/12/2019 1838   MCV 96.6 11/12/2019 1838   MCH 32.6 11/12/2019 1838   MCHC 33.7 11/12/2019 1838   RDW 12.1 11/12/2019 1838   LYMPHSABS 2.6 11/12/2019 1838    MONOABS 0.6 11/12/2019 1838   EOSABS 0.1 11/12/2019 1838   BASOSABS 0.1 11/12/2019 1838    No results found for: POCLITH, LITHIUM   No results found for: PHENYTOIN, PHENOBARB, VALPROATE, CBMZ   .res Assessment: Plan:   Patient seen for 30 minutes and time spent counseling patient regarding possible treatment options.  Discussed considering orally disintegrating tablets if absorption is a concern.  Patient reports that Breanna Young typically is able to keep down medication and declines need for orally disintegrating tabs.  Breanna Young reports that her current signs and symptoms are situational and declines any changes in medication at this time. Discussed that it may be beneficial to resume therapy.  Patient reports that her schedule is limited at this time due to other medical appointments and that it is difficult to attend appointments with current physical signs and symptoms.  Breanna Young reports that Breanna Young will consider resuming therapy once physical condition improves. Continue lamotrigine 200 mg daily for mood signs and symptoms. Continue Klonopin for anxiety. Continue Viibryd 30 mg daily for anxiety depression. Continue trazodone for insomnia. Continue Topamax 25 mg at bedtime for migraines. Patient to follow-up in 3 months or sooner if clinically indicated. Patient advised to contact office with any questions, adverse effects, or acute worsening in signs and symptoms.  Breanna Young was seen today for follow-up.  Diagnoses and all orders for this visit:  PTSD (post-traumatic stress disorder) -     clonazePAM (KLONOPIN) 1 MG tablet; TAKE 1 TABLET BY MOUTH TWICE A DAY THEN 1 TAB AS NEEDED FOR ANXIETY  Generalized anxiety disorder -     clonazePAM (KLONOPIN) 1 MG tablet; TAKE 1 TABLET BY MOUTH TWICE A DAY THEN 1 TAB AS NEEDED FOR ANXIETY  Recurrent major depressive disorder, in full remission (HCC) -     lamoTRIgine (LAMICTAL) 200 MG tablet; Take 1 tablet (200 mg total) by mouth daily.     Please see  After Visit Summary for patient specific instructions.  Future Appointments  Date Time Provider [place]  11/14/2019 12:00 PM Inda Coke, [place] [facility]  02/11/2020  1:00 PM [name], [facility] None    No orders of the defined types were placed in this encounter.   -------------------------------
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
## 2019-11-13 ENCOUNTER — Telehealth: Payer: Self-pay

## 2019-11-13 NOTE — Telephone Encounter (Signed)
error 

## 2019-11-14 ENCOUNTER — Other Ambulatory Visit: Payer: Self-pay

## 2019-11-14 ENCOUNTER — Encounter: Payer: Self-pay | Admitting: Physician Assistant

## 2019-11-14 ENCOUNTER — Telehealth (INDEPENDENT_AMBULATORY_CARE_PROVIDER_SITE_OTHER): Payer: BC Managed Care – PPO | Admitting: Physician Assistant

## 2019-11-14 VITALS — Temp 99.1°F | Ht 65.5 in | Wt 136.0 lb

## 2019-11-14 DIAGNOSIS — S20219D Contusion of unspecified front wall of thorax, subsequent encounter: Secondary | ICD-10-CM | POA: Diagnosis not present

## 2019-11-14 DIAGNOSIS — K2100 Gastro-esophageal reflux disease with esophagitis, without bleeding: Secondary | ICD-10-CM | POA: Diagnosis not present

## 2019-11-14 MED ORDER — HYDROCODONE-ACETAMINOPHEN 5-325 MG PO TABS: 1.0000 | ORAL_TABLET | Freq: Four times a day (QID) | ORAL | 0 refills | Status: DC | PRN
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
## 2019-11-24 ENCOUNTER — Other Ambulatory Visit: Payer: Self-pay
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
## 2019-11-24 NOTE — Telephone Encounter (Signed)
Pt requesting Clonazepam 1mg  tab  LOV: 11/14/2019 No future visits scheduled. Please Advise.

## 2019-11-25 MED ORDER — HYDROCODONE-ACETAMINOPHEN 5-325 MG PO TABS: 1.0000 | ORAL_TABLET | Freq: Four times a day (QID) | ORAL | 0 refills | Status: DC | PRN

## 2019-11-25 NOTE — Telephone Encounter (Signed)
Pt called back told her I needed to clarify what Rx you needed cause we have you asked for Clonazepam and Hydrocodones? Pt said she just needs Hydrocodone Rx filled. Told her okay, Aldona Bar said if you need Clonazepam need to contact [name] for her mental health medications. I recommend that she reach out to her for this. It is best to have a single prescriber manage controlled substances. I will send message to. Pt verbalized understanding and said she did. Told her will send message to [facility] for refill on Hydrocodone. Pt verbalized understanding.

## 2019-11-25 NOTE — Telephone Encounter (Signed)
Left message on voicemail to call office.  

## 2019-11-25 NOTE — Telephone Encounter (Signed)
Patient sees [name] for her mental health medications. If she is needing refills on those, I recommend that she reach out to her for this. It is best to have a single prescriber manage controlled substances.
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
## 2019-12-08 ENCOUNTER — Telehealth: Payer: Self-pay

## 2019-12-08 ENCOUNTER — Encounter (HOSPITAL_COMMUNITY): Payer: Self-pay | Admitting: Emergency Medicine

## 2019-12-08 ENCOUNTER — Emergency Department (HOSPITAL_COMMUNITY)
Admission: EM | Admit: 2019-12-08 | Discharge: 2019-12-09 | Disposition: A | Discharge status: Home / Self Care | Payer: BC Managed Care – PPO | Attending: Emergency Medicine | Admitting: Emergency Medicine

## 2019-12-08 ENCOUNTER — Emergency Department (HOSPITAL_COMMUNITY): Payer: BC Managed Care – PPO

## 2019-12-08 DIAGNOSIS — R52 Pain, unspecified: Secondary | ICD-10-CM | POA: Diagnosis not present

## 2019-12-08 DIAGNOSIS — Z79899 Other long term (current) drug therapy: Secondary | ICD-10-CM | POA: Diagnosis not present

## 2019-12-08 DIAGNOSIS — R000 Tachycardia, unspecified: Secondary | ICD-10-CM | POA: Insufficient documentation

## 2019-12-08 DIAGNOSIS — R200 Anesthesia of skin: Secondary | ICD-10-CM | POA: Diagnosis not present

## 2019-12-08 DIAGNOSIS — K219 Gastro-esophageal reflux disease without esophagitis: Secondary | ICD-10-CM | POA: Diagnosis not present

## 2019-12-08 DIAGNOSIS — R10816 Epigastric abdominal tenderness: Secondary | ICD-10-CM | POA: Insufficient documentation

## 2019-12-08 DIAGNOSIS — R202 Paresthesia of skin: Secondary | ICD-10-CM | POA: Diagnosis not present

## 2019-12-08 DIAGNOSIS — M4802 Spinal stenosis, cervical region: Secondary | ICD-10-CM | POA: Diagnosis not present

## 2019-12-08 DIAGNOSIS — M47812 Spondylosis without myelopathy or radiculopathy, cervical region: Secondary | ICD-10-CM | POA: Diagnosis not present

## 2019-12-08 LAB — COMPREHENSIVE METABOLIC PANEL
ALT: 15 U/L (ref 0–44)
AST: 23 U/L (ref 15–41)
Albumin: 4.1 g/dL (ref 3.5–5.0)
Alkaline Phosphatase: 51 U/L (ref 38–126)
Anion gap: 11 (ref 5–15)
BUN: 9 mg/dL (ref 6–20)
CO2: 20 mmol/L — ABNORMAL LOW (ref 22–32)
Calcium: 8.9 mg/dL (ref 8.9–10.3)
Chloride: 109 mmol/L (ref 98–111)
Creatinine, Ser: 0.83 mg/dL (ref 0.44–1.00)
GFR, Estimated: >60 mL/min (ref >60)
Glucose, Bld: 93 mg/dL (ref 70–99)
Potassium: 3.5 mmol/L (ref 3.5–5.1)
Sodium: 140 mmol/L (ref 135–145)
Total Bilirubin: 0.6 mg/dL (ref 0.3–1.2)
Total Protein: 6.4 g/dL — ABNORMAL LOW (ref 6.5–8.1)

## 2019-12-08 LAB — CBC WITH DIFFERENTIAL/PLATELET
Abs Immature Granulocytes: 0.01 10*3/uL (ref 0.00–0.07)
Basophils Absolute: 0 10*3/uL (ref 0.0–0.1)
Basophils Relative: 1 %
Eosinophils Absolute: 0.2 10*3/uL (ref 0.0–0.5)
Eosinophils Relative: 4 %
HCT: 39.9 % (ref 36.0–46.0)
Hemoglobin: 13.2 g/dL (ref 12.0–15.0)
Immature Granulocytes: 0 %
Lymphocytes Relative: 36 %
Lymphs Abs: 1.8 10*3/uL (ref 0.7–4.0)
MCH: 32.4 pg (ref 26.0–34.0)
MCHC: 33.1 g/dL (ref 30.0–36.0)
MCV: 97.8 fL (ref 80.0–100.0)
Monocytes Absolute: 0.3 10*3/uL (ref 0.1–1.0)
Monocytes Relative: 5 %
Neutro Abs: 2.7 10*3/uL (ref 1.7–7.7)
Neutrophils Relative %: 54 %
Platelets: 176 10*3/uL (ref 150–400)
RBC: 4.08 MIL/uL (ref 3.87–5.11)
RDW: 13.2 % (ref 11.5–15.5)
WBC: 5 10*3/uL (ref 4.0–10.5)
nRBC: 0 % (ref 0.0–0.2)

## 2019-12-08 LAB — MAGNESIUM: Magnesium: 1.7 mg/dL (ref 1.7–2.4)

## 2019-12-08 LAB — PHOSPHORUS: Phosphorus: 2.5 mg/dL (ref 2.5–4.6)

## 2019-12-08 MED ORDER — LORAZEPAM 1 MG PO TABS
1.0000 mg | ORAL_TABLET | Freq: Once | ORAL | Status: DC
  Filled 2019-12-08: qty 1

## 2019-12-08 MED ORDER — CLONAZEPAM 0.5 MG PO TABS
1.0000 mg | ORAL_TABLET | Freq: Once | ORAL | Status: AC
  Administered 2019-12-08: 1 mg via ORAL
  Filled 2019-12-08: qty 2
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
## 2019-12-08 NOTE — Discharge Instructions (Signed)
Please return to the ER with any new or worsening symptoms. It was a pleasure to meet you.  

## 2019-12-08 NOTE — ED Notes (Signed)
Discussed patient with PA Rona Ravens, appropriate verbal orders from [place] placed by this RN.
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
## 2019-12-09 NOTE — Telephone Encounter (Signed)
FYI

## 2019-12-09 NOTE — Telephone Encounter (Signed)
Pt went to ED

## 2019-12-30 ENCOUNTER — Other Ambulatory Visit: Payer: Self-pay | Admitting: Psychiatry

## 2019-12-30 DIAGNOSIS — K219 Gastro-esophageal reflux disease without esophagitis: Secondary | ICD-10-CM | POA: Diagnosis not present

## 2019-12-30 DIAGNOSIS — G43911 Migraine, unspecified, intractable, with status migrainosus: Secondary | ICD-10-CM

## 2020-01-29 ENCOUNTER — Telehealth: Payer: Self-pay

## 2020-01-29 NOTE — Telephone Encounter (Signed)
Please see message and advise 

## 2020-01-29 NOTE — Telephone Encounter (Signed)
I personally do not feel comfortable ordering these tests given she has seen a couple of specialists who are capable of ordering these tests. I recommend that she reach out to her specialists to see if they could help her with this.

## 2020-01-29 NOTE — Telephone Encounter (Signed)
Patient is calling in stating that she feels like her speciality doctors are at no help with her issues. Breanna Young states she had surgery a few years ago for acid reflux but still has had no relief, and Breanna Young referred her out to a specialist. She says that she feels like the specialist's are putting her through the same tests she had before her surgery and are getting no where. Breanna Young is wondering if there is a test that could see if her gallbladder is causing all of her issues.

## 2020-01-30 NOTE — Telephone Encounter (Signed)
Noted  

## 2020-01-30 NOTE — Telephone Encounter (Signed)
Pt returned call. Read pt Breanna Young's note about the tests. Pt understood and stated she will ask her specialists.

## 2020-02-11 ENCOUNTER — Ambulatory Visit: Payer: BC Managed Care – PPO | Admitting: Psychiatry

## 2020-02-24 DIAGNOSIS — R112 Nausea with vomiting, unspecified: Secondary | ICD-10-CM | POA: Diagnosis not present

## 2020-02-24 DIAGNOSIS — K219 Gastro-esophageal reflux disease without esophagitis: Secondary | ICD-10-CM | POA: Diagnosis not present

## 2020-02-24 DIAGNOSIS — Z9889 Other specified postprocedural states: Secondary | ICD-10-CM | POA: Diagnosis not present

## 2020-02-24 DIAGNOSIS — R1011 Right upper quadrant pain: Secondary | ICD-10-CM | POA: Diagnosis not present

## 2020-03-10 ENCOUNTER — Ambulatory Visit (INDEPENDENT_AMBULATORY_CARE_PROVIDER_SITE_OTHER): Payer: BC Managed Care – PPO | Admitting: Psychiatry

## 2020-03-10 ENCOUNTER — Other Ambulatory Visit: Payer: Self-pay

## 2020-03-10 DIAGNOSIS — F3342 Major depressive disorder, recurrent, in full remission: Secondary | ICD-10-CM

## 2020-03-10 DIAGNOSIS — G43911 Migraine, unspecified, intractable, with status migrainosus: Secondary | ICD-10-CM

## 2020-03-10 DIAGNOSIS — F5105 Insomnia due to other mental disorder: Secondary | ICD-10-CM

## 2020-03-10 DIAGNOSIS — F411 Generalized anxiety disorder: Secondary | ICD-10-CM | POA: Diagnosis not present

## 2020-03-10 DIAGNOSIS — F4310 Post-traumatic stress disorder, unspecified: Secondary | ICD-10-CM

## 2020-03-10 MED ORDER — CLONAZEPAM 1 MG PO TABS: ORAL_TABLET | ORAL | 2 refills | Status: DC

## 2020-03-10 MED ORDER — TRAZODONE HCL 100 MG PO TABS: ORAL_TABLET | ORAL | 1 refills | Status: DC

## 2020-03-10 MED ORDER — ALPRAZOLAM 2 MG PO TABS: ORAL_TABLET | ORAL | 0 refills | Status: DC

## 2020-03-10 MED ORDER — LAMOTRIGINE 200 MG PO TABS: 200.0000 mg | ORAL_TABLET | Freq: Every day | ORAL | 1 refills | Status: DC

## 2020-03-10 MED ORDER — VIIBRYD 20 MG PO TABS: 30.0000 mg | ORAL_TABLET | Freq: Every day | ORAL | 1 refills | Status: DC

## 2020-03-10 MED ORDER — TOPIRAMATE 25 MG PO TABS: 50.0000 mg | ORAL_TABLET | Freq: Every day | ORAL | 1 refills | Status: DC
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
## 2020-03-12 ENCOUNTER — Encounter: Payer: Self-pay | Admitting: Psychiatry

## 2020-03-19 DIAGNOSIS — R1011 Right upper quadrant pain: Secondary | ICD-10-CM | POA: Diagnosis not present

## 2020-03-19 DIAGNOSIS — R112 Nausea with vomiting, unspecified: Secondary | ICD-10-CM | POA: Diagnosis not present

## 2020-03-19 DIAGNOSIS — K219 Gastro-esophageal reflux disease without esophagitis: Secondary | ICD-10-CM | POA: Diagnosis not present

## 2020-03-19 DIAGNOSIS — Z9889 Other specified postprocedural states: Secondary | ICD-10-CM | POA: Diagnosis not present

## 2020-03-24 DIAGNOSIS — K219 Gastro-esophageal reflux disease without esophagitis: Secondary | ICD-10-CM | POA: Diagnosis not present

## 2020-03-24 DIAGNOSIS — R112 Nausea with vomiting, unspecified: Secondary | ICD-10-CM | POA: Diagnosis not present

## 2020-03-24 DIAGNOSIS — R1011 Right upper quadrant pain: Secondary | ICD-10-CM | POA: Diagnosis not present

## 2020-03-24 DIAGNOSIS — Z9889 Other specified postprocedural states: Secondary | ICD-10-CM | POA: Diagnosis not present

## 2020-04-06 ENCOUNTER — Telehealth: Payer: Self-pay | Admitting: Psychiatry

## 2020-04-06 DIAGNOSIS — G43911 Migraine, unspecified, intractable, with status migrainosus: Secondary | ICD-10-CM

## 2020-04-06 DIAGNOSIS — F5105 Insomnia due to other mental disorder: Secondary | ICD-10-CM

## 2020-04-06 MED ORDER — TOPIRAMATE 25 MG PO TABS: 25.0000 mg | ORAL_TABLET | Freq: Every day | ORAL | 1 refills | Status: DC

## 2020-04-06 NOTE — Telephone Encounter (Signed)
Called and left message for pt that she can decrease Topamax to 25 mg po QHS and that there is not a need to taper dose. Patient advised to contact office with any questions, adverse effects, or acute worsening in signs and symptoms.

## 2020-04-06 NOTE — Telephone Encounter (Signed)
Pt called back asking if she can decrease her Topamax from 50mg  to 25mg  not increase. Pt can tolerate 25mg  not 50mg . Pt wants to know if she needs to ween herself off or can she start tonight. Please call pt having onset of double vision.

## 2020-04-06 NOTE — Telephone Encounter (Signed)
Please review

## 2020-04-06 NOTE — Telephone Encounter (Signed)
Breanna Young called to ask if she can increase her Topamax from 25 mg to 50 mg? She has been having a dull headache, blurry vision and double vision. Her phone number is [phone].

## 2020-04-07 DIAGNOSIS — K219 Gastro-esophageal reflux disease without esophagitis: Secondary | ICD-10-CM | POA: Diagnosis not present

## 2020-04-07 DIAGNOSIS — R112 Nausea with vomiting, unspecified: Secondary | ICD-10-CM | POA: Diagnosis not present

## 2020-04-07 DIAGNOSIS — Z9889 Other specified postprocedural states: Secondary | ICD-10-CM | POA: Diagnosis not present

## 2020-04-07 DIAGNOSIS — R1011 Right upper quadrant pain: Secondary | ICD-10-CM | POA: Diagnosis not present

## 2020-04-07 NOTE — Telephone Encounter (Signed)
reviewed

## 2020-04-14 DIAGNOSIS — Z20828 Contact with and (suspected) exposure to other viral communicable diseases: Secondary | ICD-10-CM | POA: Diagnosis not present

## 2020-04-14 DIAGNOSIS — Z20822 Contact with and (suspected) exposure to covid-19: Secondary | ICD-10-CM | POA: Diagnosis not present

## 2020-04-21 DIAGNOSIS — R12 Heartburn: Secondary | ICD-10-CM | POA: Diagnosis not present

## 2020-04-21 DIAGNOSIS — R1110 Vomiting, unspecified: Secondary | ICD-10-CM | POA: Diagnosis not present

## 2020-04-21 DIAGNOSIS — R1310 Dysphagia, unspecified: Secondary | ICD-10-CM | POA: Diagnosis not present

## 2020-04-21 DIAGNOSIS — K2289 Other specified disease of esophagus: Secondary | ICD-10-CM | POA: Diagnosis not present

## 2020-04-22 DIAGNOSIS — R1310 Dysphagia, unspecified: Secondary | ICD-10-CM | POA: Diagnosis not present

## 2020-04-22 DIAGNOSIS — R12 Heartburn: Secondary | ICD-10-CM | POA: Diagnosis not present

## 2020-04-22 DIAGNOSIS — K219 Gastro-esophageal reflux disease without esophagitis: Secondary | ICD-10-CM | POA: Diagnosis not present

## 2020-04-22 DIAGNOSIS — R1110 Vomiting, unspecified: Secondary | ICD-10-CM | POA: Diagnosis not present

## 2020-04-29 DIAGNOSIS — Z20828 Contact with and (suspected) exposure to other viral communicable diseases: Secondary | ICD-10-CM | POA: Diagnosis not present

## 2020-04-29 DIAGNOSIS — Z20822 Contact with and (suspected) exposure to covid-19: Secondary | ICD-10-CM | POA: Diagnosis not present

## 2020-05-06 DIAGNOSIS — R112 Nausea with vomiting, unspecified: Secondary | ICD-10-CM | POA: Diagnosis not present

## 2020-05-06 DIAGNOSIS — R1310 Dysphagia, unspecified: Secondary | ICD-10-CM | POA: Diagnosis not present

## 2020-05-06 DIAGNOSIS — K219 Gastro-esophageal reflux disease without esophagitis: Secondary | ICD-10-CM | POA: Diagnosis not present

## 2020-05-06 DIAGNOSIS — K449 Diaphragmatic hernia without obstruction or gangrene: Secondary | ICD-10-CM | POA: Diagnosis not present

## 2020-05-06 DIAGNOSIS — R1011 Right upper quadrant pain: Secondary | ICD-10-CM | POA: Diagnosis not present

## 2020-06-09 ENCOUNTER — Encounter: Payer: Self-pay | Admitting: Psychiatry

## 2020-06-09 ENCOUNTER — Other Ambulatory Visit: Payer: Self-pay

## 2020-06-09 ENCOUNTER — Ambulatory Visit (INDEPENDENT_AMBULATORY_CARE_PROVIDER_SITE_OTHER): Payer: BC Managed Care – PPO | Admitting: Psychiatry

## 2020-06-09 DIAGNOSIS — F331 Major depressive disorder, recurrent, moderate: Secondary | ICD-10-CM

## 2020-06-09 DIAGNOSIS — F411 Generalized anxiety disorder: Secondary | ICD-10-CM | POA: Diagnosis not present

## 2020-06-09 DIAGNOSIS — F4310 Post-traumatic stress disorder, unspecified: Secondary | ICD-10-CM | POA: Diagnosis not present

## 2020-06-09 DIAGNOSIS — F5105 Insomnia due to other mental disorder: Secondary | ICD-10-CM

## 2020-06-09 DIAGNOSIS — G43911 Migraine, unspecified, intractable, with status migrainosus: Secondary | ICD-10-CM

## 2020-06-09 DIAGNOSIS — F3342 Major depressive disorder, recurrent, in full remission: Secondary | ICD-10-CM

## 2020-06-09 MED ORDER — LAMOTRIGINE 200 MG PO TABS: 200.0000 mg | ORAL_TABLET | Freq: Every day | ORAL | 0 refills | Status: DC

## 2020-06-09 MED ORDER — CLONAZEPAM 1 MG PO TABS: ORAL_TABLET | ORAL | 2 refills | Status: DC

## 2020-06-09 MED ORDER — TOPIRAMATE 25 MG PO TABS: 25.0000 mg | ORAL_TABLET | Freq: Every day | ORAL | 1 refills | Status: DC

## 2020-06-09 MED ORDER — ALPRAZOLAM 2 MG PO TABS: ORAL_TABLET | ORAL | 0 refills | Status: DC

## 2020-06-09 MED ORDER — VIIBRYD 20 MG PO TABS: 30.0000 mg | ORAL_TABLET | Freq: Every day | ORAL | 1 refills | Status: DC

## 2020-06-09 MED ORDER — TRAZODONE HCL 100 MG PO TABS: ORAL_TABLET | ORAL | 0 refills | Status: DC

## 2020-06-09 NOTE — Progress Notes (Signed)
Breanna Young [id number] 01/30/1980 40 y.o.  Subjective:   Patient ID:  Breanna Young is a 40 y.o. (DOB 07-04-1980) female.  Chief Complaint:  Chief Complaint  Patient presents with  . Anxiety    HPI Breanna Young presents to the office today for follow-up of anxiety, depression, and insomnia. She reports that she has had 6 medical appointments since last visit. She reports that Xanax prn was minimally effective when she had a medical procedure. She reports "I'm having to make myself throw up because things are not digesting." She had an endoscopy and esophagus was stretched. She reports that she was told that there were some abnormalities seen on endoscopy and is awaiting recommendations. She reports that she has continued fullness and pressure. She reports that she has to eat small amounts of food due to GI issues.   She reports severe anxiety in response to medical issues and situational stress. Breanna Young lost his job 3 weeks ago and she now has all the financial responsibility. She reports that she is having frequent panic and awakens immediately with panic s/s. She reports periods of increased heart rate and/or palpitations. Notices muscle tension. She reports that she typically will take Klonopin 2 mg in the morning. She reports falling asleep without difficulty and then has multiple awakenings a night. She reports sleeping until 12:30-1 am on the days she is off. She reports that she has been having stress dreams. Energy has been very low. Motivation is also low. She reports occasional depression in response to dealing with medical issues. Concentration is not as good as it used to be. She notices she is repeating herself at times and has been prefacing things with, "stop me if I have told you this..." Denies SI.   Breanna Young is dealing with some depression after being laid off and he is sleeping excessively.    Past Psychiatric Medication Trials: Viibryd- More effective at 40 mg dose  and then had more "fogginess" and some sexual side effective. Not effective at 20 mg. Trintellix- Helpful for depression Zoloft-sexual side effects. Felt more "upbeat." Lexapro-adverse reaction Wellbutrin XL-increased anxiety and felt fuzzy headed Lamictal- helpful for mood and anxiety. Rexulti-affectivedulling, weight gain Trazodone Ativan Klonopin Xanax- Sleepy Doxazosin-dissociation BuSpar-adverse reaction BC Powder- Ineffective Excedrin Migraine Tizanidine- ineffective for migraines Propranolol Ubrelvy Topamax  GAD-7   Flowsheet Row Office Visit from 11/05/2019 in [place]  Total GAD-7 Score 16    PHQ2-9   [place] Office Visit from 11/05/2019 in [facility]  PHQ-2 Total Score 2  PHQ-9 Total Score 13       Review of Systems:  Review of Systems  HENT: Positive for trouble swallowing.   Gastrointestinal: Positive for nausea and vomiting.  Musculoskeletal: Positive for back pain. Negative for gait problem.  Neurological: Positive for headaches. Negative for tremors.       Had a severe migraine a few days ago.  Psychiatric/Behavioral:       Please refer to HPI    Medications: I have reviewed the patient's current medications.  Current Outpatient Medications  Medication Sig Dispense Refill  . Cyanocobalamin (VITAMIN B 12) 250 MCG LOZG Take by mouth.    . cyclobenzaprine (FLEXERIL) 10 MG tablet Take 1 tablet (10 mg total) by mouth 2 (two) times daily as needed for muscle spasms. 20 tablet 0  . levonorgestrel (MIRENA) 20 MCG/24HR IUD 1 each by Intrauterine route once.     . ondansetron (ZOFRAN) 4 MG tablet Take 4 mg by  mouth every 8 (eight) hours as needed for nausea or vomiting.    . Probiotic Product (PROBIOTIC PO) Take 1 capsule by mouth 3 (three) times a week.    . promethazine (PHENERGAN) 25 MG tablet Take 1 tablet (25 mg total) by mouth every 6 (six) hours as needed for nausea or vomiting. 30 tablet 0  .  SUMAtriptan (IMITREX) 50 MG tablet TAKE 1 TAB EVERY 2 HRS AS NEEDED FOR MIGRAINE. MAY REPEAT IN 2 HOURS IF HEADACHE PERSISTS OR RECURS. 8 tablet 1  . alprazolam (XANAX) 2 MG tablet Take as needed prior to medical procedures 5 tablet 0  . [START ON 07/07/2020] clonazePAM (KLONOPIN) 1 MG tablet TAKE 1 TABLET BY MOUTH TWICE A DAY THEN 1 TAB AS NEEDED FOR ANXIETY 90 tablet 2  . HYDROcodone-acetaminophen (NORCO/VICODIN) 5-325 MG tablet Take 1 tablet by mouth every 6 (six) hours as needed for moderate pain. (Patient not taking: Reported on 06/09/2020) 20 tablet 0  . ibuprofen (ADVIL) 600 MG tablet Take 1 tablet (600 mg total) by mouth every 6 (six) hours as needed. (Patient not taking: Reported on 12/08/2019) 30 tablet 0  . lamoTRIgine (LAMICTAL) 200 MG tablet Take 1 tablet (200 mg total) by mouth daily. 90 tablet 0  . Multiple Vitamin (MULTIVITAMIN ADULT) TABS Take 1 tablet by mouth 3 (three) times a week. (Patient not taking: Reported on 06/09/2020)    . simethicone (GAS-X) 80 MG chewable tablet Chew 1 tablet (80 mg total) by mouth 4 (four) times daily as needed for flatulence. (Patient not taking: Reported on 12/08/2019) 100 tablet 2  . topiramate (TOPAMAX) 25 MG tablet Take 1 tablet (25 mg total) by mouth at bedtime. 90 tablet 1  . traZODone (DESYREL) 100 MG tablet Take 1.5 tabs po QHS prn insomnia 135 tablet 0  . Vilazodone HCl (VIIBRYD) 20 MG TABS Take 1.5 tablets (30 mg total) by mouth daily. 135 tablet 1   No current facility-administered medications for this visit.    Medication Side Effects: None  Allergies:  Allergies  Allergen Reactions  . Aluminum-Containing Compounds     Rash/itchy  . Avelox [Moxifloxacin Hcl In Nacl] Other (See Comments)    Unknown reaction type (possible rash reaction)   . Dexlansoprazole Other (See Comments)    Unknown reaction type (possible rash reaction)  . Doxazosin Other (See Comments)    Dizziness/faint  . Reglan [Metoclopramide]     Hallucinations and a  contraindication to her viibyrd    Past Medical History:  Diagnosis Date  . Anxiety   . Depression   . GERD (gastroesophageal reflux disease)   . Hiatal hernia   . IBS (irritable bowel syndrome)     Past Medical History, Surgical history, Social history, and Family history were reviewed and updated as appropriate.   Please see review of systems for further details on the patient's review from today.   Objective:   Physical Exam:  There were no vitals taken for this visit.  Physical Exam Constitutional:      General: She is not in acute distress. Musculoskeletal:        General: No deformity.  Neurological:     Mental Status: She is alert and oriented to person, place, and time.     Coordination: Coordination normal.  Psychiatric:        Attention and Perception: Attention and perception normal. She does not perceive auditory or visual hallucinations.        Mood and Affect: Affect is not labile, blunt,  angry or inappropriate.        Speech: Speech normal.        Behavior: Behavior normal.        Thought Content: Thought content normal. Thought content is not paranoid or delusional. Thought content does not include homicidal or suicidal ideation. Thought content does not include homicidal or suicidal plan.        Cognition and Memory: Cognition and memory normal.        Judgment: Judgment normal.     Comments: Mood is appropriate to content.  Affect is congruent.     Lab Review:     Component Value Date/Time   NA 140 12/08/2019 1422   K 3.5 12/08/2019 1422   CL 109 12/08/2019 1422   CO2 20 (L) 12/08/2019 1422   GLUCOSE 93 12/08/2019 1422   BUN 9 12/08/2019 1422   CREATININE 0.83 12/08/2019 1422   CREATININE 0.86 11/05/2019 1435   CALCIUM 8.9 12/08/2019 1422   PROT 6.4 (L) 12/08/2019 1422   ALBUMIN 4.1 12/08/2019 1422   AST 23 12/08/2019 1422   ALT 15 12/08/2019 1422   ALKPHOS 51 12/08/2019 1422   BILITOT 0.6 12/08/2019 1422   GFRNONAA >60 12/08/2019 1422        Component Value Date/Time   WBC 5.0 12/08/2019 1422   RBC 4.08 12/08/2019 1422   HGB 13.2 12/08/2019 1422   HCT 39.9 12/08/2019 1422   PLT 176 12/08/2019 1422   MCV 97.8 12/08/2019 1422   MCH 32.4 12/08/2019 1422   MCHC 33.1 12/08/2019 1422   RDW 13.2 12/08/2019 1422   LYMPHSABS 1.8 12/08/2019 1422   MONOABS 0.3 12/08/2019 1422   EOSABS 0.2 12/08/2019 1422   BASOSABS 0.0 12/08/2019 1422    No results found for: POCLITH, LITHIUM   No results found for: PHENYTOIN, PHENOBARB, VALPROATE, CBMZ   .res Assessment: Plan:    Patient seen for 30 minutes and time spent counseling the patient regarding treatment options.  Discussed that mood and anxiety signs and symptoms appear to be directly correlated with acute stressors, including medical issues.  She wishes to continue current medications without changes since this combination of medications has been most effective for her mood and anxiety signs and symptoms.  She reports that she is currently able to swallow prescribed medications.  Encouraged patient to contact provider if she has any swallowing issues with medications in the future to discuss plan to switch to crushed meds, liquid, or orally disintegrating tablets. Continue Viibryd 30 mg daily for mood and anxiety signs and symptoms. Continue Klonopin 1 mg twice daily and 1 tablet as needed for anxiety. Continue lamotrigine 200 mg daily for mood signs and symptoms. Continue trazodone 150 mg at bedtime as needed for insomnia. Continue Topamax 25 mg at bedtime for migraines and insomnia. Continue Xanax 2 mg prior to medical procedures. Patient to follow-up with this provider in 3 months or sooner if clinically indicated. Patient reports that she is unable to see a therapist at this time due to number of other medical appointments and that she will likely follow up with therapist when acute medical issues have resolved.  Case staffed with therapist, Lina Sayre, [facility], and  therapist made aware of recent events and stressors.  Patient advised to contact office with any questions, adverse effects, or acute worsening in signs and symptoms.   [place] was seen today for anxiety.  Diagnoses and all orders for this visit:  PTSD (post-traumatic stress disorder) -  clonazePAM (KLONOPIN) 1 MG tablet; TAKE 1 TABLET BY MOUTH TWICE A DAY THEN 1 TAB AS NEEDED FOR ANXIETY -     Vilazodone HCl (VIIBRYD) 20 MG TABS; Take 1.5 tablets (30 mg total) by mouth daily.  Generalized anxiety disorder -     clonazePAM (KLONOPIN) 1 MG tablet; TAKE 1 TABLET BY MOUTH TWICE A DAY THEN 1 TAB AS NEEDED FOR ANXIETY -     Vilazodone HCl (VIIBRYD) 20 MG TABS; Take 1.5 tablets (30 mg total) by mouth daily.  Moderate episode of recurrent major depressive disorder (HCC) -     lamoTRIgine (LAMICTAL) 200 MG tablet; Take 1 tablet (200 mg total) by mouth daily. -     Vilazodone HCl (VIIBRYD) 20 MG TABS; Take 1.5 tablets (30 mg total) by mouth daily.  Insomnia due to mental disorder -     traZODone (DESYREL) 100 MG tablet; Take 1.5 tabs po QHS prn insomnia -     topiramate (TOPAMAX) 25 MG tablet; Take 1 tablet (25 mg total) by mouth at bedtime.  Intractable migraine with status migrainosus, unspecified migraine type -     topiramate (TOPAMAX) 25 MG tablet; Take 1 tablet (25 mg total) by mouth at bedtime.  Other orders -     alprazolam (XANAX) 2 MG tablet; Take as needed prior to medical procedures     Please see After Visit Summary for patient specific instructions.  Future Appointments  Date Time Provider [place]  09/08/2020  1:00 PM [name], PMHNP [facility] None    No orders of the defined types were placed in this encounter.   -------------------------------

## 2020-06-15 DIAGNOSIS — R112 Nausea with vomiting, unspecified: Secondary | ICD-10-CM | POA: Diagnosis not present

## 2020-06-15 DIAGNOSIS — R1319 Other dysphagia: Secondary | ICD-10-CM | POA: Diagnosis not present

## 2020-07-05 IMAGING — RF DG ESOPHAGUS
8 of 11 series · 15 of 22 positions shown · non-contrast
Comparison: 12/31/2018.

CLINICAL DATA: Increased acid reflux. Status post esophageal
dilatation and transoral incisionless fundoplication 03/26/2019.

EXAM:
ESOPHOGRAM / BARIUM SWALLOW / BARIUM TABLET STUDY
TECHNIQUE: Combined double contrast and single contrast examination performed
using effervescent crystals, thick barium liquid, and thin barium
liquid. The patient was observed with fluoroscopy swallowing a 13 mm
barium sulphate tablet.
FLUOROSCOPY TIME:  Fluoroscopy Time:  0 minutes 48 seconds
Radiation Exposure Index (if provided by the fluoroscopic device):
3.2 mGy
Number of Acquired Spot Images: 0

[Series 1: cp_standard · 0.35mm/px · 3 of 30 frames shown (1 of 8)]
[frame 2/30]
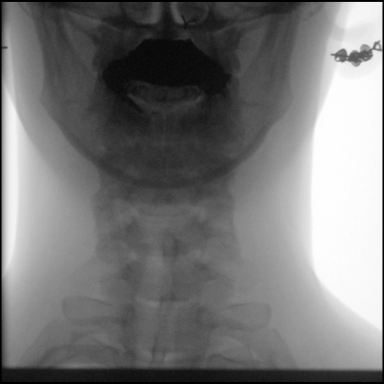
[frame 16/30]
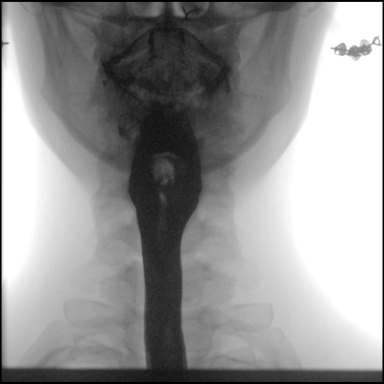
[frame 26/30]
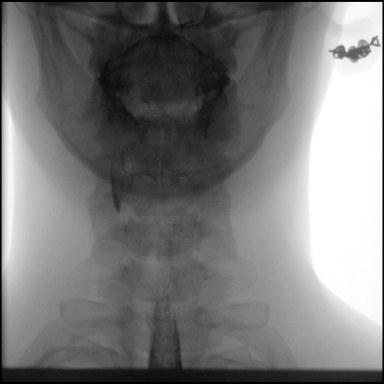

[Series 3: cp_standard · 0.35mm/px · 2 of 25 frames shown (2 of 8)]
[frame 4/25]
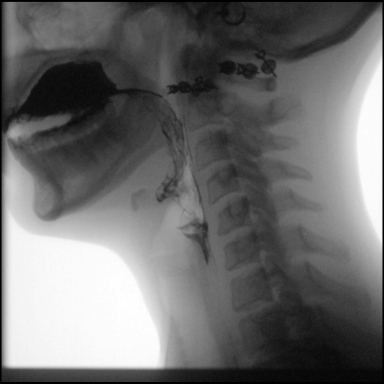
[frame 13/25]
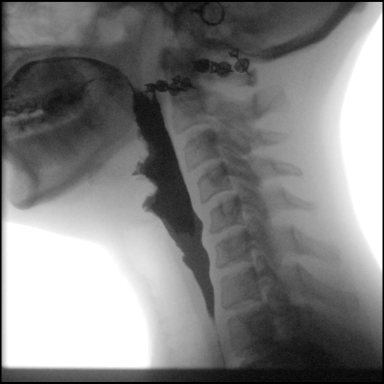

[Series 4: cp_standard · 0.17mm/px · 1 of 1 slices shown (3 of 8)]
[im 1/1]
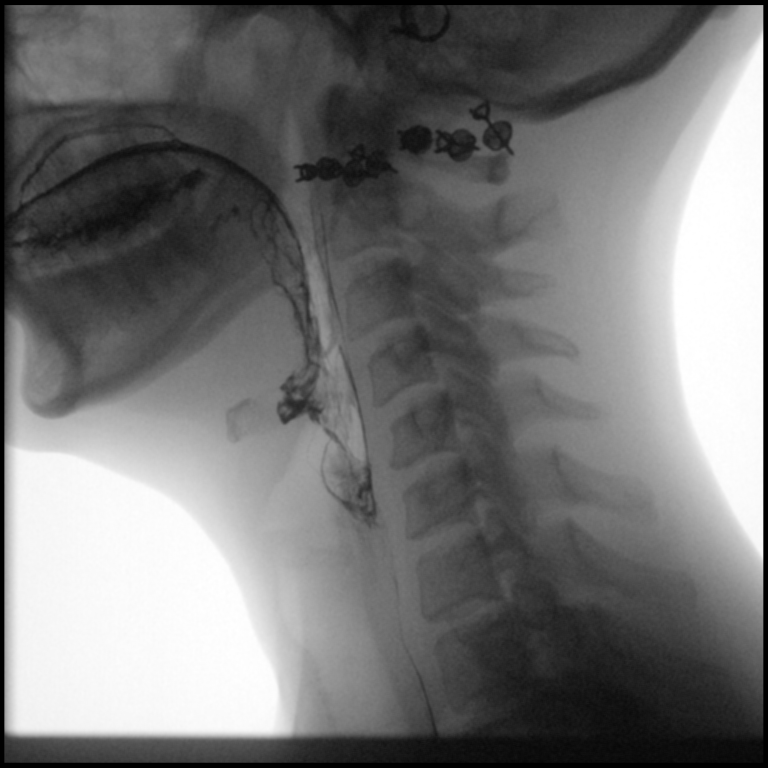

[Series 5: cp_standard · 0.26mm/px · 1 of 1 slices shown (4 of 8)]
[im 1/1]
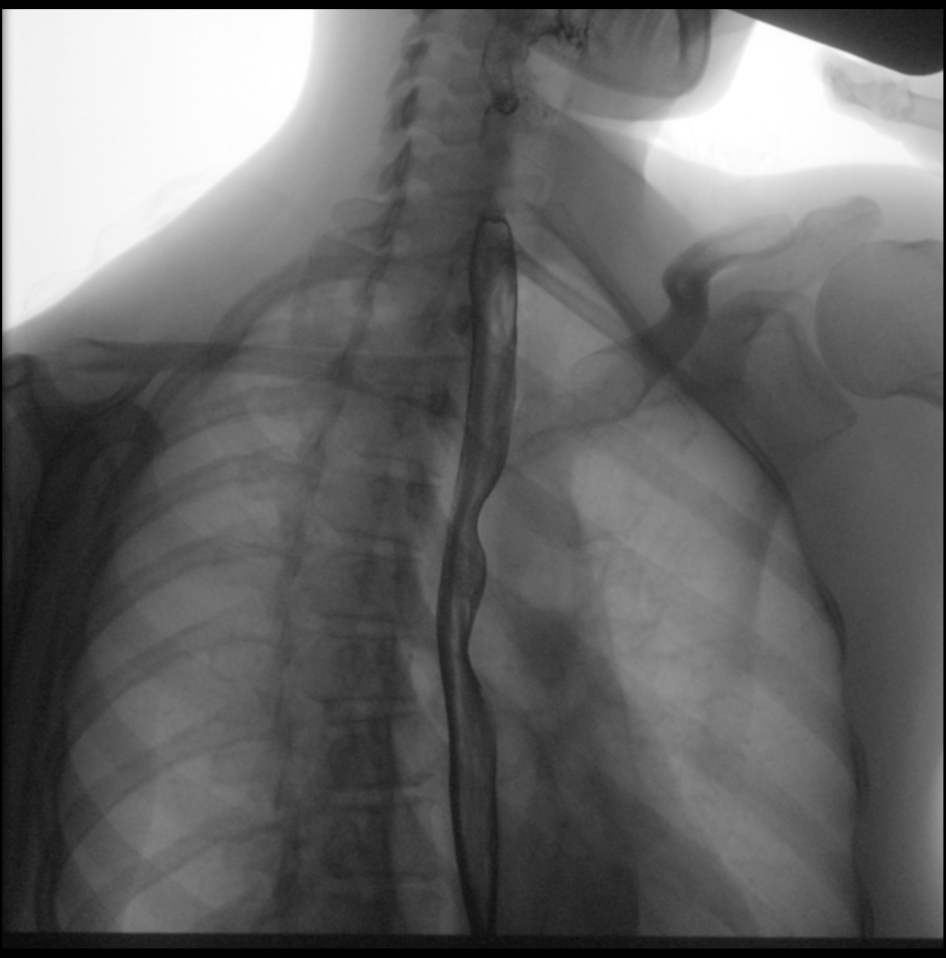

[Series 6: cp_standard · 0.52mm/px · 3 of 27 frames shown (5 of 8)]
[frame 14/27]
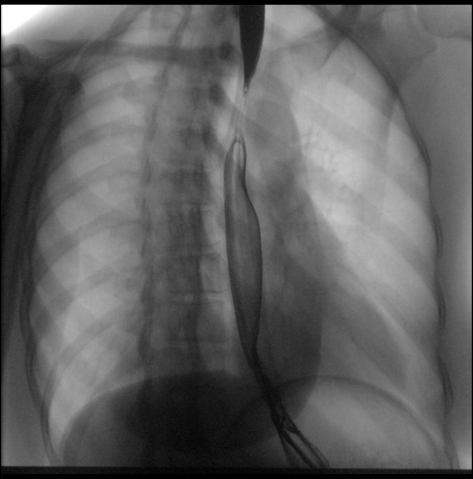
[frame 23/27]
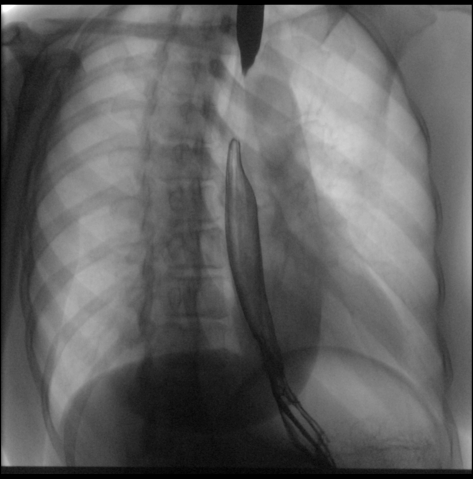
[frame 27/27]
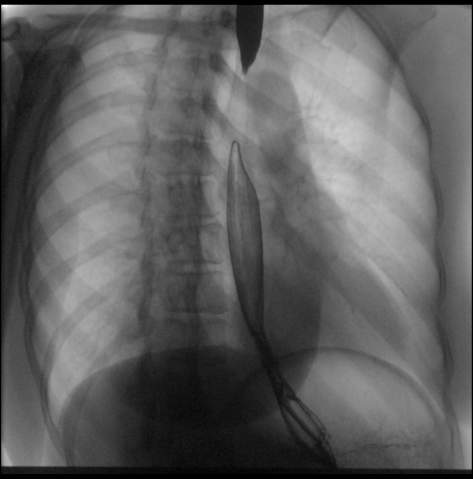

[Series 8: cp_standard · 0.27mm/px · 1 of 1 slices shown (6 of 8)]
[im 1/1]
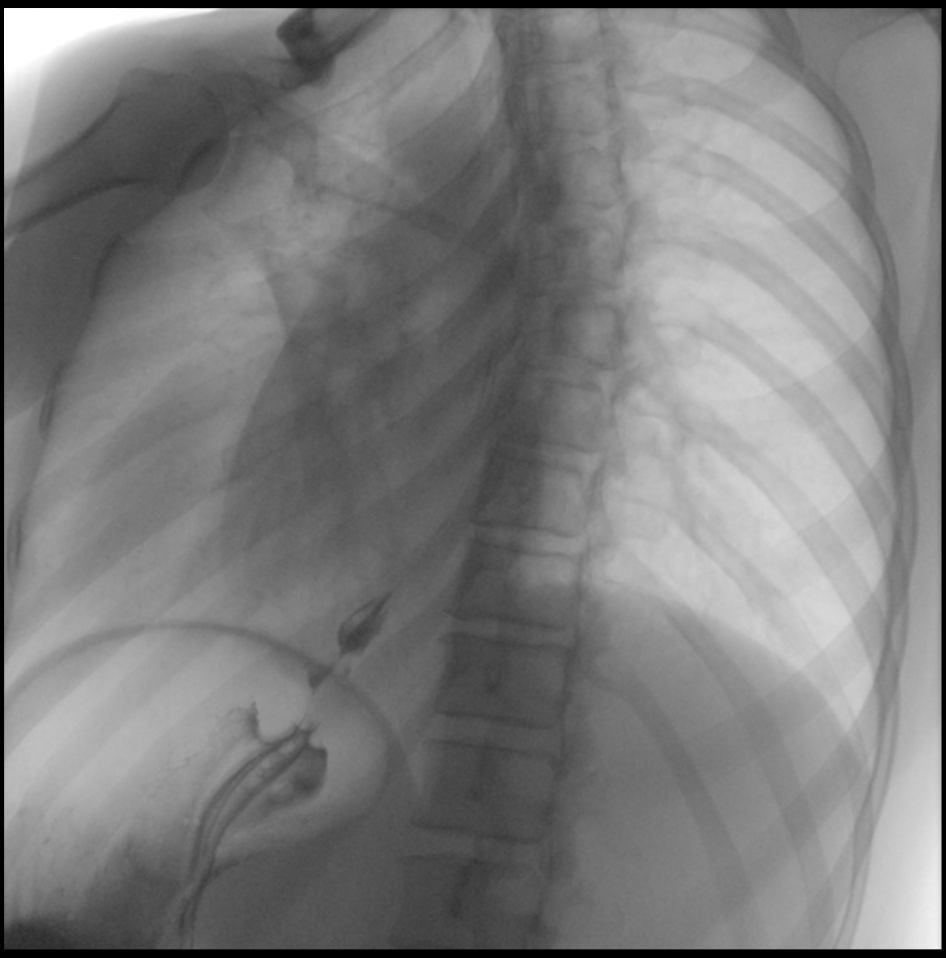

[Series 9: cp_standard · 0.27mm/px · 1 of 1 slices shown (7 of 8)]
[im 1/1]
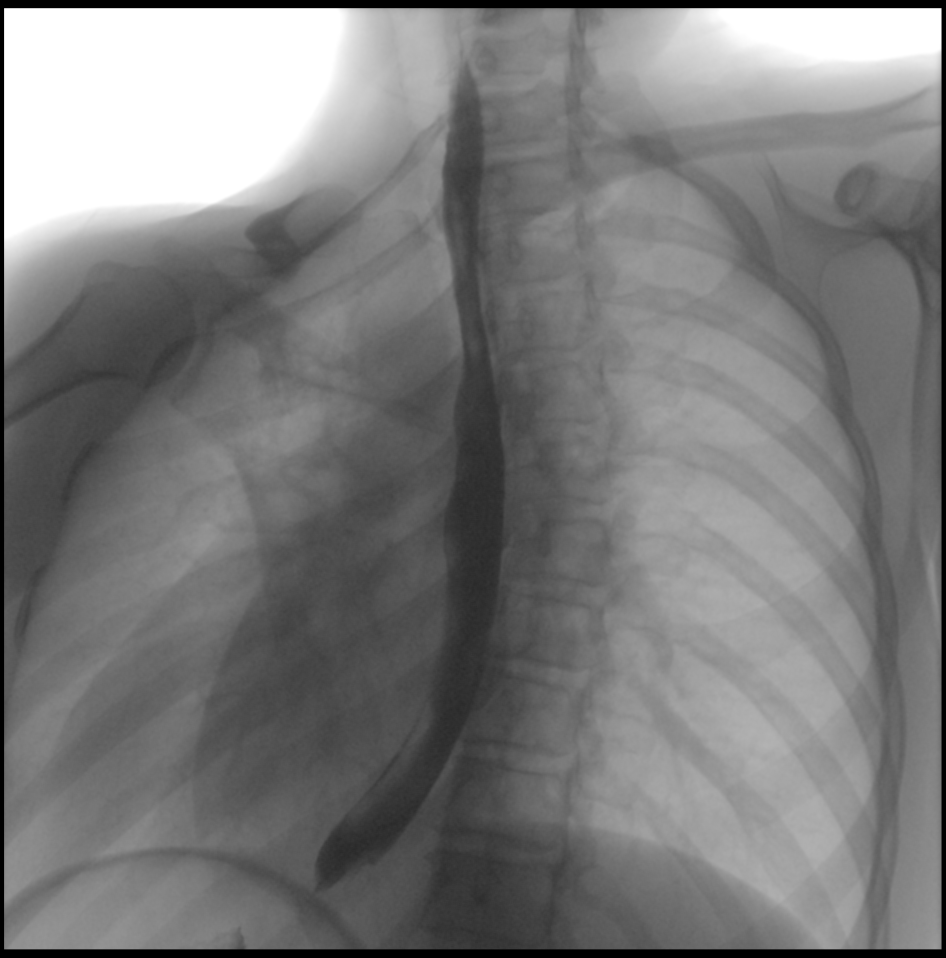

[Series 11: cp_standard · 0.54mm/px · 3 of 21 frames shown (8 of 8)]
[frame 4/21]
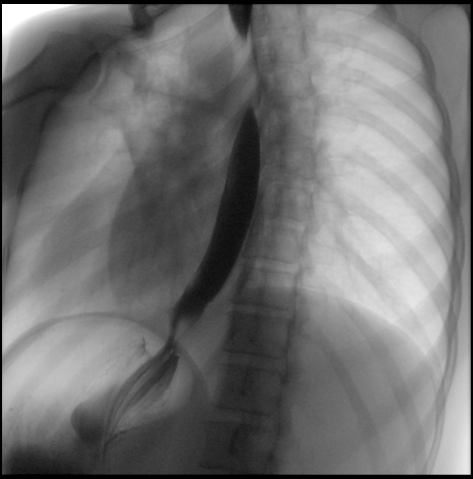
[frame 10/21]
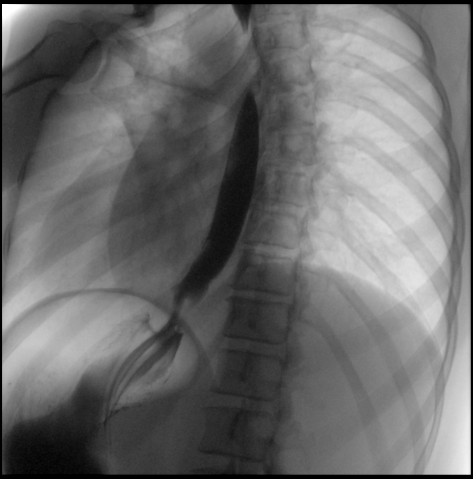
[frame 18/21]
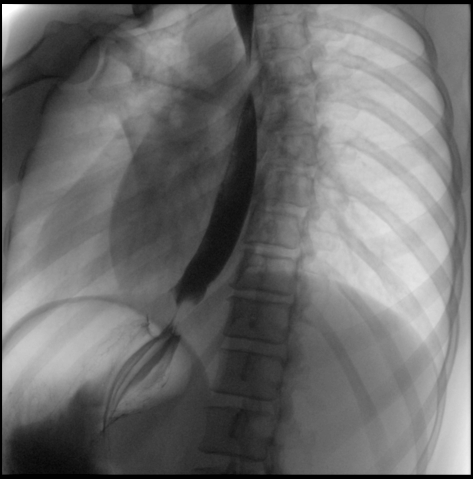

[15 of 22 positions shown; findings below may reference images not displayed]

FINDINGS: Swallowing mechanism is normal. No esophageal fold thickening,
stricture or obstruction. Normal esophageal motility. Tiny hiatal
hernia. Fundoplication defect is seen at the gastroesophageal
junction. 13 mm barium tablet passed into the stomach without
difficulty.
IMPRESSION: Fundoplication defect.  Otherwise normal exam.

## 2020-08-10 DIAGNOSIS — R1319 Other dysphagia: Secondary | ICD-10-CM | POA: Diagnosis not present

## 2020-08-19 ENCOUNTER — Telehealth: Payer: Self-pay

## 2020-08-19 NOTE — Telephone Encounter (Signed)
Prior Authorization submitted and approved for VIIBRYD 20 MG 1.5 TABS DAILY  Effective 08/12/2020-08/11/2021 with [employer]

## 2020-09-08 ENCOUNTER — Other Ambulatory Visit: Payer: Self-pay

## 2020-09-08 ENCOUNTER — Encounter: Payer: Self-pay | Admitting: Psychiatry

## 2020-09-08 ENCOUNTER — Ambulatory Visit: Payer: BC Managed Care – PPO | Admitting: Psychiatry

## 2020-09-08 DIAGNOSIS — F331 Major depressive disorder, recurrent, moderate: Secondary | ICD-10-CM | POA: Diagnosis not present

## 2020-09-08 DIAGNOSIS — F5105 Insomnia due to other mental disorder: Secondary | ICD-10-CM

## 2020-09-08 DIAGNOSIS — F4310 Post-traumatic stress disorder, unspecified: Secondary | ICD-10-CM | POA: Diagnosis not present

## 2020-09-08 DIAGNOSIS — F411 Generalized anxiety disorder: Secondary | ICD-10-CM

## 2020-09-08 MED ORDER — BELSOMRA 20 MG PO TABS: 20.0000 mg | ORAL_TABLET | Freq: Every day | ORAL | 0 refills | Status: AC

## 2020-09-08 MED ORDER — VILAZODONE HCL 20 MG PO TABS: 30.0000 mg | ORAL_TABLET | Freq: Every day | ORAL | 1 refills | Status: DC

## 2020-09-08 MED ORDER — BELSOMRA 10 MG PO TABS: 10.0000 mg | ORAL_TABLET | Freq: Every day | ORAL | 0 refills | Status: DC

## 2020-09-08 MED ORDER — LAMOTRIGINE 200 MG PO TABS: 200.0000 mg | ORAL_TABLET | Freq: Every day | ORAL | 0 refills | Status: DC

## 2020-09-08 MED ORDER — BELSOMRA 15 MG PO TABS: 15.0000 mg | ORAL_TABLET | Freq: Every day | ORAL | 0 refills | Status: DC

## 2020-09-08 MED ORDER — CLONAZEPAM 1 MG PO TABS: ORAL_TABLET | ORAL | 5 refills | Status: DC

## 2020-09-08 MED ORDER — TRAZODONE HCL 100 MG PO TABS: ORAL_TABLET | ORAL | 1 refills | Status: DC

## 2020-09-08 NOTE — Progress Notes (Signed)
Breanna Young [id number] 07-07-1980 40 y.o.  Subjective:   Patient ID:  Breanna Young is a 40 y.o. (DOB [birth year]) female.  Chief Complaint:  Chief Complaint  Patient presents with   Anxiety   Follow-up    H/o depression   Insomnia    HPI Breanna Young presents to the office today for follow-up of anxiety, depression, and insomnia. She reports limited changes with medical issues. She plans on proceeding with surgery. She has been having chronic diarrhea and vomiting. Reports feeling tired all the time. She reports waking up with "butterflies because I just don't know how I am going to do this day." She has anxiety about vomiting. She reports that her motivation is "not there" due to low energy. She reports that she has been falling behind on different chores, which is unlike her. She reports that her hair is shedding and attributes this to stress. Sleeping 3-4 hours a night and has difficulty getting comfortable. She reports, "my mind won't shut off." She reports waking up repeatedly and looking at the clock and has anxiety about how little sleep she will get. Reports frequent worry. She reports that she is eating less than once a day. Concentration is poor. Denies SI.   Her boyfriend has a new job and has training. She reports that she has been having to pay all their bills.   Trazodone is helpful for her sleep initiation.    Past Psychiatric Medication Trials: Viibryd- More effective at 40 mg dose and then had more "fogginess" and some sexual side effective. Not effective at 20 mg.  Trintellix- Helpful for depression Zoloft-sexual side effects. Felt more "upbeat."  Lexapro-adverse reaction Wellbutrin XL-increased anxiety and felt fuzzy headed Lamictal- helpful for mood and anxiety. Rexulti-affective dulling, weight gain Trazodone Ativan Klonopin Xanax- Sleepy Doxazosin-dissociation BuSpar-adverse reaction BC Powder- Ineffective Excedrin Migraine Tizanidine- ineffective  for migraines Propranolol Ubrelvy Topamax   GAD-7    Flowsheet Row Office Visit from 11/05/2019 in [facility]  Total GAD-7 Score 16      PHQ2-9    [place] Office Visit from 11/05/2019 in [facility]  PHQ-2 Total Score 2  PHQ-9 Total Score 13        Review of Systems:  Review of Systems  Gastrointestinal:  Positive for diarrhea, nausea and vomiting.  Musculoskeletal:  Negative for gait problem.  Neurological:  Negative for tremors.  Psychiatric/Behavioral:         Please refer to HPI   Medications: I have reviewed the patient's current medications.  Current Outpatient Medications  Medication Sig Dispense Refill   ondansetron (ZOFRAN) 4 MG tablet Take 4 mg by mouth every 8 (eight) hours as needed for nausea or vomiting.     Probiotic Product (PROBIOTIC PO) Take 1 capsule by mouth 3 (three) times a week.     promethazine (PHENERGAN) 25 MG tablet Take 1 tablet (25 mg total) by mouth every 6 (six) hours as needed for nausea or vomiting. 30 tablet 0   Suvorexant (BELSOMRA) 10 MG TABS Take 10 mg by mouth at bedtime. 10 tablet 0   Suvorexant (BELSOMRA) 15 MG TABS Take 15 mg by mouth at bedtime. 10 tablet 0   Suvorexant (BELSOMRA) 20 MG TABS Take 20 mg by mouth at bedtime for 10 days. 10 tablet 0   Vilazodone HCl (VIIBRYD) 20 MG TABS Take 1.5 tablets (30 mg total) by mouth daily. 135 tablet 1   alprazolam (XANAX) 2 MG tablet Take as needed prior to  medical procedures 5 tablet 0   [START ON 09/22/2020] clonazePAM (KLONOPIN) 1 MG tablet TAKE 1 TABLET BY MOUTH TWICE A DAY THEN 1 TAB AS NEEDED FOR ANXIETY 90 tablet 5   Cyanocobalamin (VITAMIN B 12) 250 MCG LOZG Take by mouth. (Patient not taking: Reported on 09/08/2020)     cyclobenzaprine (FLEXERIL) 10 MG tablet Take 1 tablet (10 mg total) by mouth 2 (two) times daily as needed for muscle spasms. (Patient not taking: Reported on 09/08/2020) 20 tablet 0   HYDROcodone-acetaminophen  (NORCO/VICODIN) 5-325 MG tablet Take 1 tablet by mouth every 6 (six) hours as needed for moderate pain. (Patient not taking: No sig reported) 20 tablet 0   ibuprofen (ADVIL) 600 MG tablet Take 1 tablet (600 mg total) by mouth every 6 (six) hours as needed. (Patient not taking: No sig reported) 30 tablet 0   lamoTRIgine (LAMICTAL) 200 MG tablet Take 1 tablet (200 mg total) by mouth daily. 90 tablet 0   levonorgestrel (MIRENA) 20 MCG/24HR IUD 1 each by Intrauterine route once.      Multiple Vitamin (MULTIVITAMIN ADULT) TABS Take 1 tablet by mouth 3 (three) times a week. (Patient not taking: No sig reported)     simethicone (GAS-X) 80 MG chewable tablet Chew 1 tablet (80 mg total) by mouth 4 (four) times daily as needed for flatulence. (Patient not taking: Reported on 12/08/2019) 100 tablet 2   SUMAtriptan (IMITREX) 50 MG tablet TAKE 1 TAB EVERY 2 HRS AS NEEDED FOR MIGRAINE. MAY REPEAT IN 2 HOURS IF HEADACHE PERSISTS OR RECURS. 8 tablet 1   topiramate (TOPAMAX) 25 MG tablet Take 1 tablet (25 mg total) by mouth at bedtime. 90 tablet 1   traZODone (DESYREL) 100 MG tablet Take 1.5 tabs po QHS prn insomnia 135 tablet 1   No current facility-administered medications for this visit.    Medication Side Effects: None  Allergies:  Allergies  Allergen Reactions   Aluminum-Containing Compounds     Rash/itchy   Avelox [Moxifloxacin Hcl In Nacl] Other (See Comments)    Unknown reaction type (possible rash reaction)    Dexlansoprazole Other (See Comments)    Unknown reaction type (possible rash reaction)   Doxazosin Other (See Comments)    Dizziness/faint   Reglan [Metoclopramide]     Hallucinations and a contraindication to her viibyrd    Past Medical History:  Diagnosis Date   Anxiety    Depression    GERD (gastroesophageal reflux disease)    Hiatal hernia    IBS (irritable bowel syndrome)     Past Medical History, Surgical history, Social history, and Family history were reviewed and  updated as appropriate.   Please see review of systems for further details on the patient's review from today.   Objective:   Physical Exam:  Wt 129 lb (58.5 kg)   BMI 21.47 kg/m   Physical Exam Constitutional:      General: She is not in acute distress. Musculoskeletal:        General: No deformity.  Neurological:     Mental Status: She is alert and oriented to person, place, and time.     Coordination: Coordination normal.  Psychiatric:        Attention and Perception: Attention and perception normal. She does not perceive auditory or visual hallucinations.        Mood and Affect: Affect is not labile, blunt, angry or inappropriate.        Speech: Speech normal.  Behavior: Behavior normal.        Thought Content: Thought content normal. Thought content is not paranoid or delusional. Thought content does not include homicidal or suicidal ideation. Thought content does not include homicidal or suicidal plan.        Cognition and Memory: Cognition and memory normal.        Judgment: Judgment normal.     Comments: Insight intact Anxious and depressed mood in response to medical issues and financial stressors    Lab Review:     Component Value Date/Time   NA 140 12/08/2019 1422   K 3.5 12/08/2019 1422   CL 109 12/08/2019 1422   CO2 20 (L) 12/08/2019 1422   GLUCOSE 93 12/08/2019 1422   BUN 9 12/08/2019 1422   CREATININE 0.83 12/08/2019 1422   CREATININE 0.86 11/05/2019 1435   CALCIUM 8.9 12/08/2019 1422   PROT 6.4 (L) 12/08/2019 1422   ALBUMIN 4.1 12/08/2019 1422   AST 23 12/08/2019 1422   ALT 15 12/08/2019 1422   ALKPHOS 51 12/08/2019 1422   BILITOT 0.6 12/08/2019 1422   GFRNONAA >60 12/08/2019 1422       Component Value Date/Time   WBC 5.0 12/08/2019 1422   RBC 4.08 12/08/2019 1422   HGB 13.2 12/08/2019 1422   HCT 39.9 12/08/2019 1422   PLT 176 12/08/2019 1422   MCV 97.8 12/08/2019 1422   MCH 32.4 12/08/2019 1422   MCHC 33.1 12/08/2019 1422   RDW 13.2  12/08/2019 1422   LYMPHSABS 1.8 12/08/2019 1422   MONOABS 0.3 12/08/2019 1422   EOSABS 0.2 12/08/2019 1422   BASOSABS 0.0 12/08/2019 1422    No results found for: POCLITH, LITHIUM   No results found for: PHENYTOIN, PHENOBARB, VALPROATE, CBMZ   .res Assessment: Plan:    Patient seen for 30 minutes and time spent discussing patient's concerns about recent difficulty filling Viibryd 30 mg daily.  Shared with patient that it appears a prior authorization had expired for this dose and that office had contacted her insurance and this dose was approved for an additional year.  Discussed that she should be able to now fill Viibryd 30 mg daily.  Advised her to contact office if she continues to have difficulties filling Viibryd. Discussed treatment options for insomnia. Discussed potential benefits, risks, and and side effects of Belsomra for insomnia.  Patient agrees to trial of Belsomra.  Patient provided with Belsomra 15 mg samples and advised to take 1 tablet by mouth at bedtime for 9 days, and to increase to 20 mg tabs if 15 mg dose is ineffective and not experiencing any side effects.  Patient advised to contact office if Belsomra is effective and to request a script for the dose that is effective.  Will renew trazodone in case Belsomra is not effective or well-tolerated.  Discussed that occasionally dose or abuse if one medication helps with sleep initiation and another medication helps with sleep maintenance. Continue Klonopin 1 mg twice daily and an additional tablet as needed for panic. Continue lamotrigine 200 mg daily for mood signs and symptoms. Continue Topamax 25 mg at bedtime for anxiety and migraines. Patient to follow-up in 3 months or sooner if clinically indicated. Patient advised to contact office with any questions, adverse effects, or acute worsening in signs and symptoms.   Breanna Young was seen today for anxiety, follow-up and insomnia.  Diagnoses and all orders for this  visit:  PTSD (post-traumatic stress disorder) -     Vilazodone HCl (VIIBRYD) 20  MG TABS; Take 1.5 tablets (30 mg total) by mouth daily. -     clonazePAM (KLONOPIN) 1 MG tablet; TAKE 1 TABLET BY MOUTH TWICE A DAY THEN 1 TAB AS NEEDED FOR ANXIETY  Generalized anxiety disorder -     Vilazodone HCl (VIIBRYD) 20 MG TABS; Take 1.5 tablets (30 mg total) by mouth daily. -     clonazePAM (KLONOPIN) 1 MG tablet; TAKE 1 TABLET BY MOUTH TWICE A DAY THEN 1 TAB AS NEEDED FOR ANXIETY  Moderate episode of recurrent major depressive disorder (HCC) -     Vilazodone HCl (VIIBRYD) 20 MG TABS; Take 1.5 tablets (30 mg total) by mouth daily. -     lamoTRIgine (LAMICTAL) 200 MG tablet; Take 1 tablet (200 mg total) by mouth daily.  Insomnia due to mental disorder -     traZODone (DESYREL) 100 MG tablet; Take 1.5 tabs po QHS prn insomnia -     Suvorexant (BELSOMRA) 15 MG TABS; Take 15 mg by mouth at bedtime. -     Suvorexant (BELSOMRA) 20 MG TABS; Take 20 mg by mouth at bedtime for 10 days. -     Suvorexant (BELSOMRA) 10 MG TABS; Take 10 mg by mouth at bedtime.    Please see After Visit Summary for patient specific instructions.  Future Appointments  Date Time Provider [place]  12/15/2020  1:45 PM [name], PMHNP [facility] None    No orders of the defined types were placed in this encounter.   -------------------------------

## 2020-09-21 DIAGNOSIS — R12 Heartburn: Secondary | ICD-10-CM | POA: Diagnosis not present

## 2020-09-21 DIAGNOSIS — R112 Nausea with vomiting, unspecified: Secondary | ICD-10-CM | POA: Diagnosis not present

## 2020-09-21 DIAGNOSIS — K2100 Gastro-esophageal reflux disease with esophagitis, without bleeding: Secondary | ICD-10-CM | POA: Diagnosis not present

## 2020-09-22 ENCOUNTER — Telehealth: Payer: Self-pay | Admitting: Psychiatry

## 2020-09-22 NOTE — Telephone Encounter (Signed)
Patient's fiancee(Ryan) called in today concerning a medication for Nexium '40mg'$  2x a day . States its not a medication prescribed by [facility] but he wanted to know if it was possible to find out where they could get it cheaper if they have to pay out of pocket.Pls Rtc [phone]. Joelene Millin.)

## 2020-09-22 NOTE — Telephone Encounter (Signed)
LVM to return call.

## 2020-09-28 ENCOUNTER — Other Ambulatory Visit: Payer: Self-pay | Admitting: Psychiatry

## 2020-09-28 DIAGNOSIS — G43911 Migraine, unspecified, intractable, with status migrainosus: Secondary | ICD-10-CM

## 2020-09-28 DIAGNOSIS — F5105 Insomnia due to other mental disorder: Secondary | ICD-10-CM

## 2020-10-06 ENCOUNTER — Telehealth: Payer: Self-pay | Admitting: Psychiatry

## 2020-10-06 NOTE — Telephone Encounter (Signed)
Please review

## 2020-10-06 NOTE — Telephone Encounter (Signed)
The samples of belsomra took all three 10,20 mg. made her super groggy and she had severe constipation.She was still up and down. She was in a fog the next day. Tried it for a week. She can't take this. She would like a call back at [phone]

## 2020-10-06 NOTE — Telephone Encounter (Signed)
Attempted to reach pt to discuss further. L/M for pt. Please attempt to reach pt to find out more- ie., if Trazodone is no longer helpful either, if Trazodone is partially helpful and wants something to take in combination with Trazodone, if she is having more trouble falling or staying asleep, etc.

## 2020-10-07 NOTE — Telephone Encounter (Signed)
LVM to return call.

## 2020-10-07 NOTE — Telephone Encounter (Signed)
Pt informed

## 2020-10-07 NOTE — Telephone Encounter (Signed)
Pt stated the trazodone is helpful with falling asleep but not staying asleep.She does not want to try another medication,she wants to know if taking melatonin with trazodone will help.

## 2020-10-13 DIAGNOSIS — Z1231 Encounter for screening mammogram for malignant neoplasm of breast: Secondary | ICD-10-CM | POA: Diagnosis not present

## 2020-10-13 LAB — HM MAMMOGRAPHY

## 2020-12-15 ENCOUNTER — Encounter: Payer: Self-pay | Admitting: Psychiatry

## 2020-12-15 ENCOUNTER — Ambulatory Visit (INDEPENDENT_AMBULATORY_CARE_PROVIDER_SITE_OTHER): Payer: BC Managed Care – PPO | Admitting: Psychiatry

## 2020-12-15 DIAGNOSIS — F5105 Insomnia due to other mental disorder: Secondary | ICD-10-CM

## 2020-12-15 DIAGNOSIS — F411 Generalized anxiety disorder: Secondary | ICD-10-CM

## 2020-12-15 DIAGNOSIS — F331 Major depressive disorder, recurrent, moderate: Secondary | ICD-10-CM | POA: Diagnosis not present

## 2020-12-15 DIAGNOSIS — F4310 Post-traumatic stress disorder, unspecified: Secondary | ICD-10-CM | POA: Diagnosis not present

## 2020-12-15 MED ORDER — LAMOTRIGINE 200 MG PO TABS: 200.0000 mg | ORAL_TABLET | Freq: Every day | ORAL | 0 refills | Status: DC

## 2020-12-15 MED ORDER — VILAZODONE HCL 20 MG PO TABS: 30.0000 mg | ORAL_TABLET | Freq: Every day | ORAL | 1 refills | Status: DC

## 2020-12-15 NOTE — Progress Notes (Signed)
Breanna Young [id number] Jul 28, 1980 40 y.o.  Virtual Visit via Telephone Note  I connected with pt on 12/15/20 at  1:45 PM EST by telephone and verified that I am speaking with the correct person using two identifiers.   I discussed the limitations, risks, security and privacy concerns of performing an evaluation and management service by telephone and the availability of in person appointments. I also discussed with the patient that there may be a patient responsible charge related to this service. The patient expressed understanding and agreed to proceed.   I discussed the assessment and treatment plan with the patient. The patient was provided an opportunity to ask questions and all were answered. The patient agreed with the plan and demonstrated an understanding of the instructions.   The patient was advised to call back or seek an in-person evaluation if the symptoms worsen or if the condition fails to improve as anticipated.  I provided 25 minutes of non-face-to-face time during this encounter.  The patient was located at home.  The provider was located at [place].   [name], PMHNP   Subjective:   Patient ID:  Breanna Young is a 40 y.o. (DOB [birth year]) female.  Chief Complaint:  Chief Complaint  Patient presents with   Follow-up    Anxiety, depression, and insomnia     HPI Breanna Young presents for follow-up of anxiety, depression, and insomnia. She reports that she is "pretty much the same." Has apt on Jan 3 with GI specialist to discuss treatment options. She reports that her energy is low due to awakening at night with GI s/s. She reports that her food intake remains limited. She reports that her weight seems to be stable. Anxiety has "been about the same, if not a little more." She reports that her anxiety is higher around the holidays. She reports that she now dreads going to work. She reports that some days she is needing to take two Klonopin  tablets before going to work. She reports that some days she wakes up with severe anxiety and "I don't know why. It's like my body is vibrating." Motivation has been lower. She reports that depression has"not been bad." She reports poor concentration and has to ask people to repeat themselves and cannot recall who she has told certain things. Denies SI.   She reports that she has been sick with fever.   Past Psychiatric Medication Trials: Viibryd- More effective at 40 mg dose and then had more "fogginess" and some sexual side effective. Not effective at 20 mg.  Trintellix- Helpful for depression Zoloft-sexual side effects. Felt more "upbeat."  Lexapro-adverse reaction Wellbutrin XL-increased anxiety and felt fuzzy headed Lamictal- helpful for mood and anxiety. Rexulti-affective dulling, weight gain Trazodone Belsomra-Groggy, blurred vision Ativan Klonopin Xanax- Sleepy Doxazosin-dissociation BuSpar-adverse reaction BC Powder- Ineffective Excedrin Migraine Tizanidine- ineffective for migraines Propranolol Ubrelvy Topamax    Review of Systems:  Review of Systems  Constitutional:  Positive for fever.  Gastrointestinal:  Positive for diarrhea.       Some vomiting  Musculoskeletal:  Negative for gait problem.  Neurological:  Negative for tremors.  Psychiatric/Behavioral:         Please refer to HPI   Medications: I have reviewed the patient's current medications.  Current Outpatient Medications  Medication Sig Dispense Refill   clonazePAM (KLONOPIN) 1 MG tablet TAKE 1 TABLET BY MOUTH TWICE A DAY THEN 1 TAB AS NEEDED FOR ANXIETY 90 tablet 5   cyclobenzaprine (FLEXERIL) 10 MG tablet Take 1  tablet (10 mg total) by mouth 2 (two) times daily as needed for muscle spasms. 20 tablet 0   ibuprofen (ADVIL) 600 MG tablet Take 1 tablet (600 mg total) by mouth every 6 (six) hours as needed. 30 tablet 0   levonorgestrel (MIRENA) 20 MCG/24HR IUD 1 each by Intrauterine route once.       Multiple Vitamin (MULTIVITAMIN ADULT) TABS Take 1 tablet by mouth 3 (three) times a week.     ondansetron (ZOFRAN) 4 MG tablet Take 4 mg by mouth every 8 (eight) hours as needed for nausea or vomiting.     Probiotic Product (PROBIOTIC PO) Take 1 capsule by mouth 3 (three) times a week.     promethazine (PHENERGAN) 25 MG tablet Take 1 tablet (25 mg total) by mouth every 6 (six) hours as needed for nausea or vomiting. 30 tablet 0   SUMAtriptan (IMITREX) 50 MG tablet TAKE 1 TAB EVERY 2 HRS AS NEEDED FOR MIGRAINE. MAY REPEAT IN 2 HOURS IF HEADACHE PERSISTS OR RECURS. 8 tablet 1   topiramate (TOPAMAX) 25 MG tablet Take 1 tablet (25 mg total) by mouth at bedtime. 90 tablet 1   traZODone (DESYREL) 100 MG tablet Take 1.5 tabs po QHS prn insomnia 135 tablet 1   alprazolam (XANAX) 2 MG tablet Take as needed prior to medical procedures 5 tablet 0   Cyanocobalamin (VITAMIN B 12) 250 MCG LOZG Take by mouth. (Patient not taking: Reported on 09/08/2020)     HYDROcodone-acetaminophen (NORCO/VICODIN) 5-325 MG tablet Take 1 tablet by mouth every 6 (six) hours as needed for moderate pain. (Patient not taking: Reported on 06/09/2020) 20 tablet 0   lamoTRIgine (LAMICTAL) 200 MG tablet Take 1 tablet (200 mg total) by mouth daily. 90 tablet 0   simethicone (GAS-X) 80 MG chewable tablet Chew 1 tablet (80 mg total) by mouth 4 (four) times daily as needed for flatulence. (Patient not taking: Reported on 12/08/2019) 100 tablet 2   Vilazodone HCl (VIIBRYD) 20 MG TABS Take 1.5 tablets (30 mg total) by mouth daily. 135 tablet 1   No current facility-administered medications for this visit.    Medication Side Effects: None  Allergies:  Allergies  Allergen Reactions   Aluminum-Containing Compounds     Rash/itchy   Avelox [Moxifloxacin Hcl In Nacl] Other (See Comments)    Unknown reaction type (possible rash reaction)    Dexlansoprazole Other (See Comments)    Unknown reaction type (possible rash reaction)   Doxazosin  Other (See Comments)    Dizziness/faint   Reglan [Metoclopramide]     Hallucinations and a contraindication to her viibyrd    Past Medical History:  Diagnosis Date   Anxiety    Depression    GERD (gastroesophageal reflux disease)    Hiatal hernia    IBS (irritable bowel syndrome)     Family History  Problem Relation Age of Onset   Depression Mother    Diabetes Mother    GER disease Mother        said she had it for a long time   Hyperlipidemia Mother    Hypertension Mother    Arthritis Mother    Heart attack Father    Diabetes Father    Depression Maternal Grandmother    Endometrial cancer Maternal Grandmother    Arthritis Maternal Grandmother    Hypertension Maternal Grandmother    Dementia Maternal Grandfather    Diabetes Maternal Grandfather    Stroke Maternal Grandfather    Dementia Paternal Grandmother  Arthritis Paternal Grandmother    Heart disease Paternal Grandfather    Colon cancer Neg Hx    Breast cancer Neg Hx    Esophageal cancer Neg Hx    Stomach cancer Neg Hx    Rectal cancer Neg Hx     Social History   Socioeconomic History   Marital status: Single    Spouse name: Not on file   Number of children: Not on file   Years of education: Not on file   Highest education level: Not on file  Occupational History   Occupation: hair stylist  Tobacco Use   Smoking status: Never   Smokeless tobacco: Never  Vaping Use   Vaping Use: Never used  Substance and Sexual Activity   Alcohol use: Not Currently    Alcohol/week: 2.0 standard drinks    Types: 2 Glasses of wine per week   Drug use: Never   Sexual activity: Yes    Partners: Male    Birth control/protection: I.U.D.  Other Topics Concern   Not on file  Social History Narrative   Lives with boyfriend   Hairstylist   Social Determinants of Health   Financial Resource Strain: Not on file  Food Insecurity: Not on file  Transportation Needs: Not on file  Physical Activity: Not on file   Stress: Not on file  Social Connections: Not on file  Intimate Partner Violence: Not on file    Past Medical History, Surgical history, Social history, and Family history were reviewed and updated as appropriate.   Please see review of systems for further details on the patient's review from today.   Objective:   Physical Exam:  There were no vitals taken for this visit.  Physical Exam Neurological:     Mental Status: She is alert and oriented to person, place, and time.     Cranial Nerves: No dysarthria.  Psychiatric:        Attention and Perception: Attention and perception normal.        Mood and Affect: Mood is anxious.        Speech: Speech normal.        Behavior: Behavior is cooperative.        Thought Content: Thought content normal. Thought content is not paranoid or delusional. Thought content does not include homicidal or suicidal ideation. Thought content does not include homicidal or suicidal plan.        Cognition and Memory: Cognition and memory normal.        Judgment: Judgment normal.     Comments: Insight intact    Lab Review:     Component Value Date/Time   NA 140 12/08/2019 1422   K 3.5 12/08/2019 1422   CL 109 12/08/2019 1422   CO2 20 (L) 12/08/2019 1422   GLUCOSE 93 12/08/2019 1422   BUN 9 12/08/2019 1422   CREATININE 0.83 12/08/2019 1422   CREATININE 0.86 11/05/2019 1435   CALCIUM 8.9 12/08/2019 1422   PROT 6.4 (L) 12/08/2019 1422   ALBUMIN 4.1 12/08/2019 1422   AST 23 12/08/2019 1422   ALT 15 12/08/2019 1422   ALKPHOS 51 12/08/2019 1422   BILITOT 0.6 12/08/2019 1422   GFRNONAA >60 12/08/2019 1422       Component Value Date/Time   WBC 5.0 12/08/2019 1422   RBC 4.08 12/08/2019 1422   HGB 13.2 12/08/2019 1422   HCT 39.9 12/08/2019 1422   PLT 176 12/08/2019 1422   MCV 97.8 12/08/2019 1422   MCH 32.4 12/08/2019  1422   MCHC 33.1 12/08/2019 1422   RDW 13.2 12/08/2019 1422   LYMPHSABS 1.8 12/08/2019 1422   MONOABS 0.3 12/08/2019 1422    EOSABS 0.2 12/08/2019 1422   BASOSABS 0.0 12/08/2019 1422    No results found for: POCLITH, LITHIUM   No results found for: PHENYTOIN, PHENOBARB, VALPROATE, CBMZ   .res Assessment: Plan:   She reports that she would like to continue current medication. Will remove Belsomra from medication list since she reports that she was unable to tolerate Belsomra and has recently had adequate sleep aside from awakening with GI s/s.  Continue Viibryd 30 mg po qd for anxiety and depression.  Continue Lamictal 200 mg po qd for mood s/s.  Continue Klonopin 1 mg po BID and prn anxiety.  Continue Topamax 25 mg po QHS for migraines.  Continue Trazodone 150 mg po QHS prn insomnia.  Pt to follow-up in 3 months or sooner if clinically indicated.  Patient advised to contact office with any questions, adverse effects, or acute worsening in signs and symptoms.   Jaquana was seen today for follow-up.  Diagnoses and all orders for this visit:  PTSD (post-traumatic stress disorder) -     Vilazodone HCl (VIIBRYD) 20 MG TABS; Take 1.5 tablets (30 mg total) by mouth daily.  Generalized anxiety disorder -     Vilazodone HCl (VIIBRYD) 20 MG TABS; Take 1.5 tablets (30 mg total) by mouth daily.  Moderate episode of recurrent major depressive disorder (HCC) -     Vilazodone HCl (VIIBRYD) 20 MG TABS; Take 1.5 tablets (30 mg total) by mouth daily. -     lamoTRIgine (LAMICTAL) 200 MG tablet; Take 1 tablet (200 mg total) by mouth daily.  Insomnia due to mental disorder   Please see After Visit Summary for patient specific instructions.  Future Appointments  Date Time Provider [place]  03/16/2021  1:30 PM [name], PMHNP [facility] None    No orders of the defined types were placed in this encounter.     -------------------------------

## 2021-01-14 ENCOUNTER — Telehealth: Payer: BC Managed Care – PPO | Admitting: Physician Assistant

## 2021-01-18 DIAGNOSIS — R197 Diarrhea, unspecified: Secondary | ICD-10-CM | POA: Diagnosis not present

## 2021-01-18 DIAGNOSIS — K219 Gastro-esophageal reflux disease without esophagitis: Secondary | ICD-10-CM | POA: Diagnosis not present

## 2021-01-18 DIAGNOSIS — R112 Nausea with vomiting, unspecified: Secondary | ICD-10-CM | POA: Diagnosis not present

## 2021-02-09 ENCOUNTER — Telehealth: Payer: Self-pay | Admitting: Psychiatry

## 2021-02-09 DIAGNOSIS — F332 Major depressive disorder, recurrent severe without psychotic features: Secondary | ICD-10-CM

## 2021-02-09 MED ORDER — CARIPRAZINE HCL 1.5 MG PO CAPS: 1.5000 mg | ORAL_CAPSULE | Freq: Every day | ORAL | 0 refills | Status: DC

## 2021-02-09 NOTE — Telephone Encounter (Signed)
LVM for patient to [facility].

## 2021-02-09 NOTE — Telephone Encounter (Signed)
Pt called upset, stating about to have a break down. Has a lot going on.  Needs to talk to someone about meds. Has apt 3/1. No apt before available. Contact # [phone] call asap.

## 2021-02-09 NOTE — Telephone Encounter (Signed)
Returned call to patient. She reports feeling "helpless, depressed... I don't know how to get out of this." She reports that she has taken all of her allotted Klonopin for the day. She reports taking 2 tabs this morning when she awakened with a panic attack. She reports that she had to take another Klonopin and "it's not helping." She reports that her nephew is in the hospital and "they don't know what is wrong with him." Her boyfriend is still unemployed. She reports that they are in danger of losing their home and do not have somewhere else to go. She reports that she is depressed and has been crying often and "not wanting to get out of bed." She reports that she feels "like I am going to have a nervous breakdown." She reports frequent panic. She reports that she has been unable to eat or sleep. Denies suicidal intent or plan. "I would not do it, but I just want everything to be over."   She reports continued health issues.   Discussed PHP and IOP as possible treatment options and provided pt with contact information.   Also provided patient with information regarding behavioral health urgent care center to include address and phone number. She reports that she would ask her boyfriend to take her if symptoms worsen. Discussed that she can also call the on-call provider if needed.   She reports that she would like to see a different therapist. Discussed possible therapy referrals.  Discussed potential benefits, risks, and side effects of Vraylar. Pt agrees to trial of Vraylar 1.5 mg po qd for depression.   Pt to follow-up with this provider on 2/22 and will also place pt on cancellation list.   Patient advised to contact office with any questions, adverse effects, or acute worsening in signs and symptoms.   Past Psychiatric Medication Trials: Viibryd- More effective at 40 mg dose and then had more "fogginess" and some sexual side effective. Not effective at 20 mg.  Trintellix- Helpful for  depression Zoloft-sexual side effects. Felt more "upbeat."  Lexapro-adverse reaction Wellbutrin XL-increased anxiety and felt fuzzy headed Lamictal- helpful for mood and anxiety. Rexulti-affective dulling, weight gain Trazodone Belsomra-Groggy, blurred vision Ativan Klonopin Xanax- Sleepy Doxazosin-dissociation BuSpar-adverse reaction BC Powder- Ineffective Excedrin Migraine Tizanidine- ineffective for migraines Propranolol Ubrelvy Topamax
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
## 2021-02-10 NOTE — Telephone Encounter (Signed)
That's fine

## 2021-02-10 NOTE — Telephone Encounter (Signed)
Please schedule with Breanna Young on his first available Wednesday apt since that is pt's day off.

## 2021-02-11 NOTE — Telephone Encounter (Signed)
LVM  asking Pt to call back and schedule with CA. (Check 3/8 Pt needs Wednesday apt) Was seeing HI. Ok to change per HI & CA.

## 2021-03-08 ENCOUNTER — Other Ambulatory Visit: Payer: Self-pay | Admitting: Psychiatry

## 2021-03-08 DIAGNOSIS — F4310 Post-traumatic stress disorder, unspecified: Secondary | ICD-10-CM

## 2021-03-08 DIAGNOSIS — F411 Generalized anxiety disorder: Secondary | ICD-10-CM

## 2021-03-09 ENCOUNTER — Ambulatory Visit: Payer: BC Managed Care – PPO | Admitting: Psychiatry

## 2021-03-16 ENCOUNTER — Ambulatory Visit: Payer: BC Managed Care – PPO | Admitting: Psychiatry

## 2021-03-23 ENCOUNTER — Other Ambulatory Visit: Payer: Self-pay

## 2021-03-23 ENCOUNTER — Ambulatory Visit: Payer: BC Managed Care – PPO | Admitting: Mental Health

## 2021-03-23 ENCOUNTER — Ambulatory Visit: Payer: BC Managed Care – PPO | Admitting: Psychiatry

## 2021-03-23 ENCOUNTER — Encounter: Payer: Self-pay | Admitting: Psychiatry

## 2021-03-23 DIAGNOSIS — F411 Generalized anxiety disorder: Secondary | ICD-10-CM

## 2021-03-23 DIAGNOSIS — F5105 Insomnia due to other mental disorder: Secondary | ICD-10-CM

## 2021-03-23 DIAGNOSIS — F332 Major depressive disorder, recurrent severe without psychotic features: Secondary | ICD-10-CM

## 2021-03-23 DIAGNOSIS — F4310 Post-traumatic stress disorder, unspecified: Secondary | ICD-10-CM | POA: Diagnosis not present

## 2021-03-23 DIAGNOSIS — G43911 Migraine, unspecified, intractable, with status migrainosus: Secondary | ICD-10-CM

## 2021-03-23 DIAGNOSIS — F331 Major depressive disorder, recurrent, moderate: Secondary | ICD-10-CM

## 2021-03-23 MED ORDER — LAMOTRIGINE 200 MG PO TABS: 200.0000 mg | ORAL_TABLET | Freq: Every day | ORAL | 1 refills | Status: DC

## 2021-03-23 MED ORDER — VILAZODONE HCL 20 MG PO TABS: 30.0000 mg | ORAL_TABLET | Freq: Every day | ORAL | 1 refills | Status: DC

## 2021-03-23 MED ORDER — TRAZODONE HCL 100 MG PO TABS: ORAL_TABLET | ORAL | 1 refills | Status: DC

## 2021-03-23 MED ORDER — CLONAZEPAM 1 MG PO TABS: ORAL_TABLET | ORAL | 2 refills | Status: DC

## 2021-03-23 NOTE — Progress Notes (Signed)
[facility] Counselor Initial Adult Exam  Name: Breanna Young Date: 03/23/2021 MRN: [id number] DOB: 1980-05-20 PCP: Inda Coke, PA  Time spent: 50 minutes  Reason for Visit /Presenting Problem: Patient reports that she has been under a lot of stress, anxious, panic episodes occurring along with feeling depressed.  She stated she has been isolating, states that home is her "safe place".  She stated her boyfriend lost his job about a year ago and he struggled to find a job since.  She went on to share how they have been together in a relationship for the past 6 years and most recently they have had increased financial stress as a result of his not working.  She stated that he has a pattern use of alcohol and when he drinks, "things can get ugly". She stated she has 2 nephews that she has been worrying about, both having significant medical issues.  Reports feeling anxious around family, her mother had a birthday recently and attending brought on more anxiety.  She says she tries to avoid social gatherings, groups.  She reports a significant history of health concerns that she has experienced over the last 2 years.  She stated that she had a surgery to work in correcting her acid reflux which has caused more problems, now regrets even having the surgery.  Coping with nausea after she eats at times, chest pain.  She reports "my brain will not shut down" referring to having ruminations, at times will wake in the morning feeling panicky and will have to take her medication.  She identified wanting to be able to improve her coping, decrease anxiety levels, feel that she is able to relax and decrease her isolated behaviors.  Mental Status Exam:    Appearance:    Casual     Behavior:   Appropriate  Motor:   WNL  Speech/Language:    Clear and Coherent  Affect:   constricted  Mood:   depressed  Thought process:   Logical, linear, goal directed  Thought content:     WNL  Sensory/Perceptual disturbances:      none  Orientation:   x4  Attention:   Good  Concentration:   Good  Memory:   Intact  Fund of knowledge:    Consistent with age and development  Insight:     Good  Judgment:    Good  Impulse Control:   Good     Reported Symptoms: Depressed mood, low motivation, anhedonia, fatigue, crying spells, anxiety, panic attacks, sleep interruption  Risk Assessment: Danger to Self:  No Self-injurious Behavior: No Danger to Others: No Duty to Warn:no Physical Aggression / Violence:No  Access to Firearms a concern: No  Gang Involvement:No  Patient / guardian was educated about steps to take if suicide or homicide risk level increases between visits: yes While future psychiatric events cannot be accurately predicted, the patient does not currently require acute inpatient psychiatric care and does not currently meet [facility] involuntary commitment criteria.  Substance Abuse History: Current substance abuse: No     Past Psychiatric History:   Previous psychological history is significant for anxiety and depression Outpatient Providers:[facility] History of Psych Hospitalization: No  Psychological Testing: none    Family History:  Family History  Problem Relation Age of Onset   Depression Mother    Diabetes Mother    GER disease Mother        said she had it for a long time   Hyperlipidemia Mother    Hypertension  Mother    Arthritis Mother    Heart attack Father    Diabetes Father    Depression Maternal Grandmother    Endometrial cancer Maternal Grandmother    Arthritis Maternal Grandmother    Hypertension Maternal Grandmother    Dementia Maternal Grandfather    Diabetes Maternal Grandfather    Stroke Maternal Grandfather    Dementia Paternal Grandmother    Arthritis Paternal Grandmother    Heart disease Paternal Grandfather    Colon cancer Neg Hx    Breast cancer Neg Hx    Esophageal cancer Neg Hx    Stomach cancer Neg Hx    Rectal cancer Neg Hx     Living  situation: the patient lives with boyfriend of 6 years  Sexual Orientation:  Straight  Relationship Status: in a relationship Name of spouse / other: Thurmond Butts             If a parent, number of children / ages: none  Support Systems; significant other  Financial Stress:  Yes   Income/Employment/Disability: Employment- Financial planner: No   Educational History: Education: some college   Recreation/Hobbies:  none stated  Stressors:Financial difficulties   Health problems    Strengths:  Supportive Relationships  Barriers:  none   Legal History: Pending legal issue / charges: none  History of legal issue / charges: none  Medical History/Surgical History: Past Medical History:  Diagnosis Date   Anxiety    Depression    GERD (gastroesophageal reflux disease)    Hiatal hernia    IBS (irritable bowel syndrome)     Past Surgical History:  Procedure Laterality Date   ESOPHAGEAL DILATION  03/26/2019   Procedure: ESOPHAGEAL DILATION;  Surgeon: Lavena Bullion, DO;  Location: [facility];  Service: Gastroenterology;;   ESOPHAGOGASTRODUODENOSCOPY (EGD) WITH PROPOFOL N/A 03/26/2019   Procedure: ESOPHAGOGASTRODUODENOSCOPY (EGD) WITH PROPOFOL;  Surgeon: Lavena Bullion, DO;  Location: [facility];  Service: Gastroenterology;  Laterality: N/A;  Food Bolus in stomach removed   TONSILLECTOMY     TRANSORAL INCISIONLESS FUNDOPLICATION N/A 2/42/6834   Procedure: TRANSORAL INCISIONLESS FUNDOPLICATION;  Surgeon: Lavena Bullion, DO;  Location: [facility];  Service: Gastroenterology;  Laterality: N/A;    Medications: Current Outpatient Medications  Medication Sig Dispense Refill   alprazolam (XANAX) 2 MG tablet Take as needed prior to medical procedures 5 tablet 0   clonazePAM (KLONOPIN) 1 MG tablet TAKE 1 TABLET BY MOUTH TWICE A DAY THEN 1 TAB AS NEEDED FOR ANXIETY 90 tablet 2   Cyanocobalamin (VITAMIN B 12) 250 MCG LOZG Take by mouth. (Patient not taking:  Reported on 09/08/2020)     cyclobenzaprine (FLEXERIL) 10 MG tablet Take 1 tablet (10 mg total) by mouth 2 (two) times daily as needed for muscle spasms. 20 tablet 0   HYDROcodone-acetaminophen (NORCO/VICODIN) 5-325 MG tablet Take 1 tablet by mouth every 6 (six) hours as needed for moderate pain. (Patient not taking: Reported on 06/09/2020) 20 tablet 0   ibuprofen (ADVIL) 600 MG tablet Take 1 tablet (600 mg total) by mouth every 6 (six) hours as needed. 30 tablet 0   lamoTRIgine (LAMICTAL) 200 MG tablet Take 1 tablet (200 mg total) by mouth daily. 90 tablet 1   levonorgestrel (MIRENA) 20 MCG/24HR IUD 1 each by Intrauterine route once.      Multiple Vitamin (MULTIVITAMIN ADULT) TABS Take 1 tablet by mouth 3 (three) times a week.     ondansetron (ZOFRAN) 4 MG tablet Take 4 mg by mouth  every 8 (eight) hours as needed for nausea or vomiting.     Probiotic Product (PROBIOTIC PO) Take 1 capsule by mouth 3 (three) times a week.     promethazine (PHENERGAN) 25 MG tablet Take 1 tablet (25 mg total) by mouth every 6 (six) hours as needed for nausea or vomiting. 30 tablet 0   SUMAtriptan (IMITREX) 50 MG tablet TAKE 1 TAB EVERY 2 HRS AS NEEDED FOR MIGRAINE. MAY REPEAT IN 2 HOURS IF HEADACHE PERSISTS OR RECURS. 8 tablet 1   topiramate (TOPAMAX) 25 MG tablet Take 1 tablet (25 mg total) by mouth at bedtime. 90 tablet 1   traZODone (DESYREL) 100 MG tablet Take 1.5 tabs po QHS prn insomnia 135 tablet 1   Vilazodone HCl (VIIBRYD) 20 MG TABS Take 1.5 tablets (30 mg total) by mouth daily. 135 tablet 1   No current facility-administered medications for this visit.    Allergies  Allergen Reactions   Aluminum-Containing Compounds     Rash/itchy   Avelox [Moxifloxacin Hcl In Nacl] Other (See Comments)    Unknown reaction type (possible rash reaction)    Dexlansoprazole Other (See Comments)    Unknown reaction type (possible rash reaction)   Doxazosin Other (See Comments)    Dizziness/faint   Reglan  [Metoclopramide]     Hallucinations and a contraindication to her viibyrd    Diagnoses:    ICD-10-CM   1. Severe episode of recurrent major depressive disorder, without psychotic features [facility])  F33.2       Plan of Care: TBD   Anson [place], [facility]
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
## 2021-04-06 ENCOUNTER — Ambulatory Visit: Payer: BC Managed Care – PPO | Admitting: Psychiatry

## 2021-04-13 ENCOUNTER — Ambulatory Visit: Payer: BC Managed Care – PPO | Admitting: Mental Health

## 2021-04-13 DIAGNOSIS — F4310 Post-traumatic stress disorder, unspecified: Secondary | ICD-10-CM | POA: Diagnosis not present
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
## 2021-04-27 ENCOUNTER — Ambulatory Visit: Payer: BC Managed Care – PPO | Admitting: Mental Health

## 2021-05-11 ENCOUNTER — Ambulatory Visit: Payer: BC Managed Care – PPO | Admitting: Mental Health

## 2021-05-24 ENCOUNTER — Telehealth: Payer: Self-pay | Admitting: Physician Assistant

## 2021-05-24 DIAGNOSIS — R197 Diarrhea, unspecified: Secondary | ICD-10-CM | POA: Diagnosis not present

## 2021-05-24 DIAGNOSIS — R1033 Periumbilical pain: Secondary | ICD-10-CM | POA: Diagnosis not present

## 2021-05-24 NOTE — Telephone Encounter (Signed)
Triage note, pt was sent to ED. ?

## 2021-05-24 NOTE — Telephone Encounter (Signed)
Pt states she started having severe abdominal pain with extreme diarrhea. She stated the pain is so severe she can't even describe it. Currently being triaged.  ?

## 2021-05-24 NOTE — Telephone Encounter (Signed)
patient ?Name: ?Breanna Young ?Mina Marble ?Gender: Female ?DOB: 28-Oct-1980 ?Age: 41 Y 58 M ?Return ?Phone ?Number: ?[phone] ?(Primary) ?Address: ?City/ ?State/ ?Zip: ?[place] ? [address] ?Client [facility] - ?Client ?[facility], [facility] ?Provider Inda Coke- PA ?Contact Type Call ?Who Is Calling Patient / Member / Family / Caregiver ?Call Type Triage / Clinical ?Relationship To Patient Self ?Return Phone Number [phone] (Primary) ?Chief Complaint SEVERE ABDOMINAL PAIN - Severe pain in ?abdomen ?Reason for Call Symptomatic / Request for Health Information ?Initial Comment Caller states she is suffering from severe ?abdominal pain since the middle of the night last ?night. ?Translation No ?Nurse Assessment ?Nurse: Sheppard Plumber, RN, Estill Bamberg Date/Time (Eastern Time): 05/24/2021 2:35:05 PM ?Confirm and document reason for call. If ?symptomatic, describe symptoms. ?---caller states she has been having abd pain since the ?middle of the night. severe abd cramping. from [employer] ?down. has diarrhea. drinking water and it is constant ?diarrhea. constant pain. last voided 10 mins ago. ?Does the patient have any new or worsening ?symptoms? ---Yes ?Will a triage be completed? ---Yes ?Related visit to physician within the last 2 weeks? ---No ?Does the PT have any chronic conditions? (i.e. ?diabetes, asthma, this includes High risk factors for ?pregnancy, etc.) ?---Yes ?List chronic conditions. ---GI issues- acid reflux ?Is the patient pregnant or possibly pregnant? (Ask ?all females between the ages of 86-55) ---No ?Is this a behavioral health or substance abuse call? ---No ?Guidelines ?Guideline Title Affirmed Question Affirmed Notes Nurse Date/Time (Eastern ?Time) ?Abdominal Pain - ?Female ?[1] MILDMODERATE pain ?AND [2] constant ?[place], RN, ?Estill Bamberg ?05/24/2021 2:35:53 PM ? ?Guidelines ?Guideline Title Affirmed Question Affirmed Notes Nurse Date/Time  (Eastern ?Time) ?AND [3] present > 2 ?hours ?Disp. Time (Eastern ?Time) Disposition Final User ?05/24/2021 2:34:06 PM Send to Urgent Delano Metz, Abby ?05/24/2021 2:42:53 PM See HCP within 4 Hours (or ?PCP triage) ?Yes [place], RN, Estill Bamberg ?Caller Disagree/Comply Comply ?Caller Understands Yes ?PreDisposition Go to ED ?Care Advice Given Per Guideline ?SEE HCP (OR PCP TRIAGE) WITHIN 4 HOURS: * IF OFFICE WILL BE OPEN: You need to be seen within the next 3 or 4 ?hours. Call your doctor (or NP/PA) now or as soon as the office opens. CARE ADVICE given per Abdominal Pain, Female (Adult) ?guideline. * You become worse CALL BACK IF: * Do not eat or drink anything for now. NOTHING BY MOUTH: REST: * Lie ?down. * Rest until you are seen. ?Comments ?User: Rozelle Logan, RN Date/Time Eilene Ghazi Time): 05/24/2021 2:39:55 PM ?pain is an 8 out of 10 at its worse has tenderness and ache constant. ?User: Rozelle Logan, RN Date/Time Eilene Ghazi Time): 05/24/2021 2:44:28 PM ?advised clear liquids- verbalized understanding. will call back for severe constant pain. 8 out of 10 last a few ?seconds. constant pain mild-moderate. understands to go to UR/ER if no appointments ?Referrals ?REFERRED TO PCP OFFICE ?

## 2021-05-25 ENCOUNTER — Ambulatory Visit (INDEPENDENT_AMBULATORY_CARE_PROVIDER_SITE_OTHER): Payer: BC Managed Care – PPO | Admitting: Mental Health

## 2021-05-25 DIAGNOSIS — F411 Generalized anxiety disorder: Secondary | ICD-10-CM | POA: Diagnosis not present

## 2021-05-25 DIAGNOSIS — F331 Major depressive disorder, recurrent, moderate: Secondary | ICD-10-CM | POA: Diagnosis not present

## 2021-05-25 NOTE — Progress Notes (Signed)
[facility] ? ?Name: Breanna Young ?Date: 05/25/2021 ?MRN: [id number] ?DOB: [birth year] ?PCP: Inda Coke, PA ? ?Time spent: 52 minutes ? ?Treatment:  ind. Therapy ? ?Virtual Visit via Telehealth Note ?Connected with patient by a telemedicine/telehealth application, with their informed consent, and verified patient privacy and that I am speaking with the correct person using two identifiers. I discussed the limitations, risks, security and privacy concerns of performing psychotherapy and the availability of in person appointments. I also discussed with the patient that there may be a patient responsible charge related to this service. The patient expressed understanding and agreed to proceed. ?I discussed the treatment planning with the patient. The patient was provided an opportunity to ask questions and all were answered. The patient agreed with the plan and demonstrated an understanding of the instructions. The patient was advised to call  our office if  symptoms worsen or feel they are in a crisis state and need immediate contact. ?  ?Therapist Location: office ?Patient Location: home ?  ? ?Mental Status Exam: ?   ?Appearance:    Casual     ?Behavior:   Appropriate  ?Motor:   WNL  ?Speech/Language:    Clear and Coherent  ?Affect:   constricted  ?Mood:   depressed  ?Thought process:   Logical, linear, goal directed  ?Thought content:     WNL  ?Sensory/Perceptual disturbances:     none  ?Orientation:   x4  ?Attention:   Good  ?Concentration:   Good  ?Memory:   Intact  ?Fund of knowledge:    Consistent with age and development  ?Insight:     Good  ?Judgment:    Good  ?Impulse Control:   Good  ?  ? ?Reported Symptoms: Depressed mood, low motivation, anhedonia, fatigue, crying spells, anxiety, panic attacks, sleep interruption ? ?Risk Assessment: ?Danger to Self:  No ?Self-injurious Behavior: No ?Danger to Others: No ?Duty to Warn:no ?Physical Aggression / Violence:No  ?Access to Firearms a  concern: No  ?Gang Involvement:No  ?Patient / guardian was educated about steps to take if suicide or homicide risk level increases between visits: yes ?While future psychiatric events cannot be accurately predicted, the patient does not currently require acute inpatient psychiatric care and does not currently meet [facility] involuntary commitment criteria. ? ? ? ?Medications: ?Current Outpatient Medications  ?Medication Sig Dispense Refill  ? alprazolam (XANAX) 2 MG tablet Take as needed prior to medical procedures 5 tablet 0  ? clonazePAM (KLONOPIN) 1 MG tablet TAKE 1 TABLET BY MOUTH TWICE A DAY THEN 1 TAB AS NEEDED FOR ANXIETY 90 tablet 2  ? Cyanocobalamin (VITAMIN B 12) 250 MCG LOZG Take by mouth. (Patient not taking: Reported on 09/08/2020)    ? cyclobenzaprine (FLEXERIL) 10 MG tablet Take 1 tablet (10 mg total) by mouth 2 (two) times daily as needed for muscle spasms. 20 tablet 0  ? HYDROcodone-acetaminophen (NORCO/VICODIN) 5-325 MG tablet Take 1 tablet by mouth every 6 (six) hours as needed for moderate pain. (Patient not taking: Reported on 06/09/2020) 20 tablet 0  ? ibuprofen (ADVIL) 600 MG tablet Take 1 tablet (600 mg total) by mouth every 6 (six) hours as needed. 30 tablet 0  ? lamoTRIgine (LAMICTAL) 200 MG tablet Take 1 tablet (200 mg total) by mouth daily. 90 tablet 1  ? levonorgestrel (MIRENA) 20 MCG/24HR IUD 1 each by Intrauterine route once.     ? Multiple Vitamin (MULTIVITAMIN ADULT) TABS Take 1 tablet by mouth 3 (three) times a week.    ?  ondansetron (ZOFRAN) 4 MG tablet Take 4 mg by mouth every 8 (eight) hours as needed for nausea or vomiting.    ? Probiotic Product (PROBIOTIC PO) Take 1 capsule by mouth 3 (three) times a week.    ? promethazine (PHENERGAN) 25 MG tablet Take 1 tablet (25 mg total) by mouth every 6 (six) hours as needed for nausea or vomiting. 30 tablet 0  ? SUMAtriptan (IMITREX) 50 MG tablet TAKE 1 TAB EVERY 2 HRS AS NEEDED FOR MIGRAINE. MAY REPEAT IN 2 HOURS IF HEADACHE  PERSISTS OR RECURS. 8 tablet 1  ? topiramate (TOPAMAX) 25 MG tablet Take 1 tablet (25 mg total) by mouth at bedtime. 90 tablet 1  ? traZODone (DESYREL) 100 MG tablet Take 1.5 tabs po QHS prn insomnia 135 tablet 1  ? Vilazodone HCl (VIIBRYD) 20 MG TABS Take 1.5 tablets (30 mg total) by mouth daily. 135 tablet 1  ? ?No current facility-administered medications for this visit.  ? ? ? ? ? ?Subjective:  ?Patient engaged in telehealth session via video.  She shared progress and events since last visit.  She stated that she has been coping with acute abdominal pain.  She went to urgent care a few days ago and is waiting for lab results to return today.  She stated has been difficult to maintain at work due to pain levels, has to stay sedentary often.  She reports continuing to struggle with motivation.  Provide support and encouragement toward allowing her to focus on her self-care in terms of her potential medical issues, acute pain levels she is coping with currently.  She was able to accept allowing that to be her primary focus.  She reports she had a recent meaningful discussion with her boyfriend, setting limits and boundaries with him due to his daily excessive drinking.  She stated that he acknowledged that he needs to significantly decrease and/or discontinue his alcohol use.  Discussed some cautions related, to utilize the ED if she feels that he needs medical detox support.  She stated that he is currently out of town on business.  She is hopeful about their future at this point but also plans to maintain boundaries such as not giving him money that he can spend on alcohol and continuing to have further discussions if needed if they are to continue their relationship. ? ?Interventions: CBT, supportive therapy ? ?Diagnoses:  ?  ICD-10-CM   ?1. Generalized anxiety disorder  F41.1   ?  ?2. Moderate episode of recurrent major depressive disorder (HCC)  F33.1   ?  ? ? ? ? ? ?DEPRESSION ? ?Plan:  Patient to utilize her  support system as needed between sessions.  Patient to give consideration regarding options in the relationship with her boyfriend while also utilizing well enforcement to ensure safety as needed. ? ?Long-term goals: ?  ?Maintain symptom reduction: The patient will report sustained reduction in symptoms of depression using both CBT and mindfulness interventions. ?Improve emotional regulation: The patient will learn and apply CBT and mindfulness-based strategies to regulate emotions, such as mindfulness-based stress reduction and cognitive restructuring, and report an improvement in emotional regulation for at least 3 consecutive months progressively. ?  ?Short-term goal:  ?The patient will learn and apply CBT and mindfulness-based coping skills for managing anxiety and practice using it between sessions. ?      2.   The patient will CBT and mindfulness-based interventions to increase awareness of negative thought patterns and work to reframe them as needed. ?  3.   Patient to identify and engage in effective communication in her marital relationship related to stressors as they arise ?      4.   Patient to care for herself by getting adequate rest. ? ? ? ?An[place], [facility]  ? ? ? ?

## 2021-06-08 ENCOUNTER — Ambulatory Visit: Payer: BC Managed Care – PPO | Admitting: Mental Health

## 2021-06-22 ENCOUNTER — Encounter: Payer: Self-pay | Admitting: Psychiatry

## 2021-06-22 ENCOUNTER — Telehealth (INDEPENDENT_AMBULATORY_CARE_PROVIDER_SITE_OTHER): Payer: BC Managed Care – PPO | Admitting: Psychiatry

## 2021-06-22 DIAGNOSIS — F3341 Major depressive disorder, recurrent, in partial remission: Secondary | ICD-10-CM

## 2021-06-22 DIAGNOSIS — F5105 Insomnia due to other mental disorder: Secondary | ICD-10-CM

## 2021-06-22 DIAGNOSIS — F4310 Post-traumatic stress disorder, unspecified: Secondary | ICD-10-CM

## 2021-06-22 DIAGNOSIS — F411 Generalized anxiety disorder: Secondary | ICD-10-CM | POA: Diagnosis not present

## 2021-06-22 MED ORDER — LAMOTRIGINE 200 MG PO TABS: 200.0000 mg | ORAL_TABLET | Freq: Every day | ORAL | 1 refills | Status: DC

## 2021-06-22 MED ORDER — CLONAZEPAM 1 MG PO TABS: ORAL_TABLET | ORAL | 2 refills | Status: DC

## 2021-06-22 MED ORDER — VILAZODONE HCL 20 MG PO TABS: 30.0000 mg | ORAL_TABLET | Freq: Every day | ORAL | 0 refills | Status: DC

## 2021-06-22 MED ORDER — TRAZODONE HCL 100 MG PO TABS: ORAL_TABLET | ORAL | 1 refills | Status: DC
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
## 2021-08-23 DIAGNOSIS — R112 Nausea with vomiting, unspecified: Secondary | ICD-10-CM | POA: Diagnosis not present

## 2021-08-23 DIAGNOSIS — R197 Diarrhea, unspecified: Secondary | ICD-10-CM | POA: Diagnosis not present

## 2021-08-23 DIAGNOSIS — R1319 Other dysphagia: Secondary | ICD-10-CM | POA: Diagnosis not present

## 2021-08-23 DIAGNOSIS — K2100 Gastro-esophageal reflux disease with esophagitis, without bleeding: Secondary | ICD-10-CM | POA: Diagnosis not present

## 2021-09-15 DIAGNOSIS — R197 Diarrhea, unspecified: Secondary | ICD-10-CM | POA: Diagnosis not present

## 2021-09-21 ENCOUNTER — Encounter: Payer: Self-pay | Admitting: Physician Assistant

## 2021-09-21 ENCOUNTER — Encounter: Payer: Self-pay | Admitting: Psychiatry

## 2021-09-21 ENCOUNTER — Ambulatory Visit (INDEPENDENT_AMBULATORY_CARE_PROVIDER_SITE_OTHER): Payer: BC Managed Care – PPO | Admitting: Psychiatry

## 2021-09-21 ENCOUNTER — Other Ambulatory Visit (INDEPENDENT_AMBULATORY_CARE_PROVIDER_SITE_OTHER): Payer: BC Managed Care – PPO

## 2021-09-21 ENCOUNTER — Ambulatory Visit (INDEPENDENT_AMBULATORY_CARE_PROVIDER_SITE_OTHER): Payer: BC Managed Care – PPO | Admitting: Physician Assistant

## 2021-09-21 VITALS — BP 100/68 | HR 96 | Temp 98.6°F | Ht 65.0 in | Wt 127.0 lb

## 2021-09-21 DIAGNOSIS — M79641 Pain in right hand: Secondary | ICD-10-CM

## 2021-09-21 DIAGNOSIS — Z0001 Encounter for general adult medical examination with abnormal findings: Secondary | ICD-10-CM | POA: Diagnosis not present

## 2021-09-21 DIAGNOSIS — F5105 Insomnia due to other mental disorder: Secondary | ICD-10-CM | POA: Diagnosis not present

## 2021-09-21 DIAGNOSIS — F3341 Major depressive disorder, recurrent, in partial remission: Secondary | ICD-10-CM | POA: Diagnosis not present

## 2021-09-21 DIAGNOSIS — L659 Nonscarring hair loss, unspecified: Secondary | ICD-10-CM

## 2021-09-21 DIAGNOSIS — M549 Dorsalgia, unspecified: Secondary | ICD-10-CM

## 2021-09-21 DIAGNOSIS — G8929 Other chronic pain: Secondary | ICD-10-CM

## 2021-09-21 DIAGNOSIS — F411 Generalized anxiety disorder: Secondary | ICD-10-CM

## 2021-09-21 DIAGNOSIS — F4310 Post-traumatic stress disorder, unspecified: Secondary | ICD-10-CM

## 2021-09-21 DIAGNOSIS — Z23 Encounter for immunization: Secondary | ICD-10-CM

## 2021-09-21 DIAGNOSIS — G43911 Migraine, unspecified, intractable, with status migrainosus: Secondary | ICD-10-CM

## 2021-09-21 DIAGNOSIS — E559 Vitamin D deficiency, unspecified: Secondary | ICD-10-CM | POA: Diagnosis not present

## 2021-09-21 DIAGNOSIS — M25562 Pain in left knee: Secondary | ICD-10-CM

## 2021-09-21 DIAGNOSIS — H6983 Other specified disorders of Eustachian tube, bilateral: Secondary | ICD-10-CM

## 2021-09-21 DIAGNOSIS — F332 Major depressive disorder, recurrent severe without psychotic features: Secondary | ICD-10-CM

## 2021-09-21 DIAGNOSIS — G43809 Other migraine, not intractable, without status migrainosus: Secondary | ICD-10-CM

## 2021-09-21 DIAGNOSIS — M25561 Pain in right knee: Secondary | ICD-10-CM

## 2021-09-21 LAB — URINALYSIS, ROUTINE W REFLEX MICROSCOPIC
Bilirubin Urine: NEGATIVE
Hgb urine dipstick: NEGATIVE
Ketones, ur: NEGATIVE
Leukocytes,Ua: NEGATIVE
Nitrite: NEGATIVE
RBC / HPF: NONE SEEN (ref 0–?)
Specific Gravity, Urine: <=1.005 — AB (ref 1.000–1.030)
Total Protein, Urine: NEGATIVE
Urine Glucose: NEGATIVE
Urobilinogen, UA: 0.2 (ref 0.0–1.0)
WBC, UA: NONE SEEN (ref 0–?)
pH: 6 (ref 5.0–8.0)

## 2021-09-21 LAB — COMPREHENSIVE METABOLIC PANEL
ALT: 12 U/L (ref 0–35)
AST: 13 U/L (ref 0–37)
Albumin: 4.3 g/dL (ref 3.5–5.2)
Alkaline Phosphatase: 63 U/L (ref 39–117)
BUN: 10 mg/dL (ref 6–23)
CO2: 23 mEq/L (ref 19–32)
Calcium: 9.9 mg/dL (ref 8.4–10.5)
Chloride: 105 mEq/L (ref 96–112)
Creatinine, Ser: 0.9 mg/dL (ref 0.40–1.20)
GFR: 79 mL/min (ref >60.00)
Glucose, Bld: 98 mg/dL (ref 70–99)
Potassium: 3.4 mEq/L — ABNORMAL LOW (ref 3.5–5.1)
Sodium: 138 mEq/L (ref 135–145)
Total Bilirubin: 0.4 mg/dL (ref 0.2–1.2)
Total Protein: 6.9 g/dL (ref 6.0–8.3)

## 2021-09-21 LAB — CBC WITH DIFFERENTIAL/PLATELET
Basophils Absolute: 0.1 10*3/uL (ref 0.0–0.1)
Basophils Relative: 1 % (ref 0.0–3.0)
Eosinophils Absolute: 0.1 10*3/uL (ref 0.0–0.7)
Eosinophils Relative: 2 % (ref 0.0–5.0)
HCT: 41.7 % (ref 36.0–46.0)
Hemoglobin: 13.9 g/dL (ref 12.0–15.0)
Lymphocytes Relative: 31.5 % (ref 12.0–46.0)
Lymphs Abs: 2.3 10*3/uL (ref 0.7–4.0)
MCHC: 33.2 g/dL (ref 30.0–36.0)
MCV: 92 fl (ref 78.0–100.0)
Monocytes Absolute: 0.4 10*3/uL (ref 0.1–1.0)
Monocytes Relative: 5.4 % (ref 3.0–12.0)
Neutro Abs: 4.4 10*3/uL (ref 1.4–7.7)
Neutrophils Relative %: 60.1 % (ref 43.0–77.0)
Platelets: 210 10*3/uL (ref 150.0–400.0)
RBC: 4.53 Mil/uL (ref 3.87–5.11)
RDW: 13 % (ref 11.5–15.5)
WBC: 7.2 10*3/uL (ref 4.0–10.5)

## 2021-09-21 LAB — VITAMIN B12: Vitamin B-12: 302 pg/mL (ref 211–911)

## 2021-09-21 LAB — LIPID PANEL
Cholesterol: 169 mg/dL (ref 0–200)
HDL: 54 mg/dL (ref >39.00)
LDL Cholesterol: 99 mg/dL (ref 0–99)
NonHDL: 115.46
Total CHOL/HDL Ratio: 3
Triglycerides: 81 mg/dL (ref 0.0–149.0)
VLDL: 16.2 mg/dL (ref 0.0–40.0)

## 2021-09-21 LAB — IBC + FERRITIN
Ferritin: 11.2 ng/mL (ref 10.0–291.0)
Iron: 144 ug/dL (ref 42–145)
Saturation Ratios: 43 % (ref 20.0–50.0)
TIBC: 334.6 ug/dL (ref 250.0–450.0)
Transferrin: 239 mg/dL (ref 212.0–360.0)

## 2021-09-21 LAB — TSH: TSH: 1.28 u[IU]/mL (ref 0.35–5.50)

## 2021-09-21 LAB — VITAMIN D 25 HYDROXY (VIT D DEFICIENCY, FRACTURES): VITD: 30.77 ng/mL (ref 30.00–100.00)

## 2021-09-21 MED ORDER — CYCLOBENZAPRINE HCL 10 MG PO TABS: 10.0000 mg | ORAL_TABLET | Freq: Three times a day (TID) | ORAL | 0 refills | Status: DC | PRN

## 2021-09-21 MED ORDER — TOPIRAMATE 25 MG PO TABS: ORAL_TABLET | ORAL | 1 refills | Status: DC

## 2021-09-21 MED ORDER — TRAZODONE HCL 100 MG PO TABS: ORAL_TABLET | ORAL | 0 refills | Status: DC

## 2021-09-21 MED ORDER — LAMOTRIGINE 200 MG PO TABS: 200.0000 mg | ORAL_TABLET | Freq: Every day | ORAL | 0 refills | Status: DC

## 2021-09-21 MED ORDER — CLONAZEPAM 1 MG PO TABS: ORAL_TABLET | ORAL | 2 refills | Status: DC

## 2021-09-21 MED ORDER — VILAZODONE HCL 20 MG PO TABS: 30.0000 mg | ORAL_TABLET | Freq: Every day | ORAL | 0 refills | Status: DC

## 2021-09-21 NOTE — Patient Instructions (Signed)
It was great to see you!  An order for lab has been put in for you. To get your labs, you can walk in at the [facility] location without a scheduled appointment.  The address is [address]. It is across the street from [facility]. Lab is located in the basement.  Hours of operation are M-F 8:30am to 5:00pm. Please note that they are closed for lunch between 12:30 and 1:00pm.  A referral has been placed for you to see one of our fantastic providers at [facility]. Someone from their office will be in touch soon regarding scheduling your appointment.  Their location:  [place] [facility]  [address] on the 1st floor Phone number [phone] Fax [phone].   This location is across the street from the entrance to [employer] and in the same complex as the [facility]  Please go to the lab for blood work.   Our office will call you with your results unless you have chosen to receive results via MyChart.  If your blood work is normal we will follow-up each year for physicals and as scheduled for chronic medical problems.  If anything is abnormal we will treat accordingly and get you in for a follow-up.  Take care,  Aldona Bar

## 2021-09-21 NOTE — Progress Notes (Signed)
Breanna Young [id number] 1980-02-08 41 y.o.  Virtual Visit via Telephone Note  I connected with pt on 09/21/21 at 12:45 PM EDT by telephone and verified that I am speaking with the correct person using two identifiers.   I discussed the limitations, risks, security and privacy concerns of performing an evaluation and management service by telephone and the availability of in person appointments. I also discussed with the patient that there may be a patient responsible charge related to this service. The patient expressed understanding and agreed to proceed.   I discussed the assessment and treatment plan with the patient. The patient was provided an opportunity to ask questions and all were answered. The patient agreed with the plan and demonstrated an understanding of the instructions.   The patient was advised to call back or seek an in-person evaluation if the symptoms worsen or if the condition fails to improve as anticipated.  I provided 30 minutes of non-face-to-face time during this encounter.  The patient was located at home.  The provider was located at home.   [name], PMHNP   Subjective:   Patient ID:  Breanna Young is a 41 y.o. (DOB Feb 28, 1980) female.  Chief Complaint:  Chief Complaint  Patient presents with   Other    Difficulty with concentration   Follow-up    Anxiety, depression, and insomnia    HPI Breanna Young presents for follow-up of depression, anxiety, and insomnia. She reports "there has been a little more anxiety" since last visit. She learned that one of her dogs has cancer/lymphoma. She has been worrying about her dog and notices she is not as focused. "I think I'm in my head more than I realize." She reports that she will occasionally ask a client a question that they already answered. She reports that her anxiety is "situational." She reports difficulty with concentration is chief complaint- "I'm there, but I'm not there." She reports that she  also is having difficulty staying focused in personal conversations with her boyfriend and friends. She reports difficulty finding the right words or will say them incorrectly. She will have to double check numbers and has difficulty retaining an amount when switching from one ap to another. She reports that she sometimes feels "emotionless." She reports that she has to "dig deep to feel" certain feelings. She reports, "I feel numb." She reports that she did not cry when she learned about her dog's diagnosis and this is unlike her. "I am happy... I feel better." Sleep has been ok. She reports that she stays up later than she should and is able to fall asleep once she goes to bed. Altered sleep wake cycle. Typically unable to fall asleep until 2-3 am. Denies nightmares or night terrors. Energy and motivation have been good and comes in bursts. Not dreading going to work. Appetite has been "ok" with occasional decreases in appetite. Denies SI.   She reports that in the past she would "self-medicate, " then drank only occasionally, and has stopped ETOH use entirely months ago.   Has not been able to exercise recently due to physical limitations.   Klonopin last filled 07/30/21 x 2.   Past Psychiatric Medication Trials: Viibryd- More effective at 40 mg dose and then had more "fogginess" and some sexual side effective. Not effective at 20 mg.  Trintellix- Helpful for depression Zoloft-sexual side effects. Felt more "upbeat."  Lexapro-adverse reaction Wellbutrin XL-increased anxiety and felt fuzzy headed Lamictal- helpful for mood and anxiety. Rexulti-affective dulling, weight gain Trazodone  Belsomra-Groggy, blurred vision Ativan Klonopin Xanax- Sleepy Doxazosin-dissociation BuSpar-adverse reaction BC Powder- Ineffective Excedrin Migraine Tizanidine- ineffective for migraines Propranolol Ubrelvy Topamax    Review of Systems:  Review of Systems  Gastrointestinal:        Improved nausea with  avoiding certain foods.  Musculoskeletal:  Positive for back pain, neck pain and neck stiffness. Negative for gait problem.       Shoulder pain  Neurological:        Occ headaches. No recent migraines.   Psychiatric/Behavioral:         Please refer to HPI    Medications: I have reviewed the patient's current medications.  Current Outpatient Medications  Medication Sig Dispense Refill   alprazolam (XANAX) 2 MG tablet Take as needed prior to medical procedures 5 tablet 0   calcium citrate (CALCITRATE - DOSED IN MG ELEMENTAL CALCIUM) 950 (200 Ca) MG tablet Take 200 mg of elemental calcium by mouth daily.     CALCIUM-MAGNESIUM PO Take by mouth.     [START ON 10/19/2021] clonazePAM (KLONOPIN) 1 MG tablet TAKE 1 TABLET BY MOUTH TWICE A DAY THEN 1 TAB AS NEEDED FOR ANXIETY 90 tablet 2   Cyanocobalamin (VITAMIN B 12) 250 MCG LOZG Take by mouth. (Patient not taking: Reported on 09/08/2020)     cyclobenzaprine (FLEXERIL) 10 MG tablet Take 1 tablet (10 mg total) by mouth 2 (two) times daily as needed for muscle spasms. 20 tablet 0   docusate sodium (COLACE) 100 MG capsule Take 100 mg by mouth daily as needed for mild constipation.     HYDROcodone-acetaminophen (NORCO/VICODIN) 5-325 MG tablet Take 1 tablet by mouth every 6 (six) hours as needed for moderate pain. (Patient not taking: Reported on 06/09/2020) 20 tablet 0   ibuprofen (ADVIL) 600 MG tablet Take 1 tablet (600 mg total) by mouth every 6 (six) hours as needed. 30 tablet 0   lamoTRIgine (LAMICTAL) 200 MG tablet Take 1 tablet (200 mg total) by mouth daily. 90 tablet 0   levonorgestrel (MIRENA) 20 MCG/24HR IUD 1 each by Intrauterine route once.      Multiple Vitamin (MULTIVITAMIN ADULT) TABS Take 1 tablet by mouth 3 (three) times a week.     Omega-3 Fatty Acids (FISH OIL) 1000 MG CAPS Take by mouth.     ondansetron (ZOFRAN) 4 MG tablet Take 4 mg by mouth every 8 (eight) hours as needed for nausea or vomiting.     Probiotic Product (PROBIOTIC PO)  Take 1 capsule by mouth daily.     promethazine (PHENERGAN) 25 MG tablet Take 1 tablet (25 mg total) by mouth every 6 (six) hours as needed for nausea or vomiting. 30 tablet 0   SUMAtriptan (IMITREX) 50 MG tablet TAKE 1 TAB EVERY 2 HRS AS NEEDED FOR MIGRAINE. MAY REPEAT IN 2 HOURS IF HEADACHE PERSISTS OR RECURS. 8 tablet 1   topiramate (TOPAMAX) 25 MG tablet Take 1/2 tab at bedtime for one week, then stop. 90 tablet 1   traZODone (DESYREL) 100 MG tablet Take 1.5 tabs po QHS prn insomnia 135 tablet 0   Vilazodone HCl (VIIBRYD) 20 MG TABS Take 1.5 tablets (30 mg total) by mouth daily. 135 tablet 0   No current facility-administered medications for this visit.    Medication Side Effects: Other: Concentration difficulties, affective dulling  Allergies:  Allergies  Allergen Reactions   Aluminum-Containing Compounds     Rash/itchy   Avelox [Moxifloxacin Hcl In Nacl] Other (See Comments)    Unknown reaction type (possible  rash reaction)    Dexlansoprazole Other (See Comments)    Unknown reaction type (possible rash reaction)   Doxazosin Other (See Comments)    Dizziness/faint   Reglan [Metoclopramide]     Hallucinations and a contraindication to her viibyrd    Past Medical History:  Diagnosis Date   Anxiety    Depression    GERD (gastroesophageal reflux disease)    Hiatal hernia    IBS (irritable bowel syndrome)     Family History  Problem Relation Age of Onset   Depression Mother    Diabetes Mother    GER disease Mother        said she had it for a long time   Hyperlipidemia Mother    Hypertension Mother    Arthritis Mother    Heart attack Father    Diabetes Father    Depression Maternal Grandmother    Endometrial cancer Maternal Grandmother    Arthritis Maternal Grandmother    Hypertension Maternal Grandmother    Dementia Maternal Grandfather    Diabetes Maternal Grandfather    Stroke Maternal Grandfather    Dementia Paternal Grandmother    Arthritis Paternal  Grandmother    Heart disease Paternal Grandfather    Colon cancer Neg Hx    Breast cancer Neg Hx    Esophageal cancer Neg Hx    Stomach cancer Neg Hx    Rectal cancer Neg Hx     Social History   Socioeconomic History   Marital status: Single    Spouse name: Not on file   Number of children: Not on file   Years of education: Not on file   Highest education level: Not on file  Occupational History   Occupation: hair stylist  Tobacco Use   Smoking status: Never   Smokeless tobacco: Never  Vaping Use   Vaping Use: Never used  Substance and Sexual Activity   Alcohol use: Not Currently    Alcohol/week: 2.0 standard drinks of alcohol    Types: 2 Glasses of wine per week   Drug use: Never   Sexual activity: Yes    Partners: Male    Birth control/protection: I.U.D.  Other Topics Concern   Not on file  Social History Narrative   Lives with boyfriend   Hairstylist   Social Determinants of Health   Financial Resource Strain: Not on file  Food Insecurity: Not on file  Transportation Needs: Not on file  Physical Activity: Sufficiently Active (10/17/2017)   Exercise Vital Sign    Days of Exercise per Week: 4 days    Minutes of Exercise per Session: 60 min  Stress: Not on file  Social Connections: Not on file  Intimate Partner Violence: Not on file    Past Medical History, Surgical history, Social history, and Family history were reviewed and updated as appropriate.   Please see review of systems for further details on the patient's review from today.   Objective:   Physical Exam:  There were no vitals taken for this visit.  Physical Exam Neurological:     Mental Status: She is alert and oriented to person, place, and time.     Cranial Nerves: No dysarthria.  Psychiatric:        Attention and Perception: Attention and perception normal.        Mood and Affect: Mood is not depressed.        Speech: Speech normal.        Behavior: Behavior is cooperative.  Thought Content: Thought content normal. Thought content is not paranoid or delusional. Thought content does not include homicidal or suicidal ideation. Thought content does not include homicidal or suicidal plan.        Cognition and Memory: Cognition and memory normal.        Judgment: Judgment normal.     Comments: Insight intact Anxiety in response to dog having cancer     Lab Review:     Component Value Date/Time   NA 140 12/08/2019 1422   K 3.5 12/08/2019 1422   CL 109 12/08/2019 1422   CO2 20 (L) 12/08/2019 1422   GLUCOSE 93 12/08/2019 1422   BUN 9 12/08/2019 1422   CREATININE 0.83 12/08/2019 1422   CREATININE 0.86 11/05/2019 1435   CALCIUM 8.9 12/08/2019 1422   PROT 6.4 (L) 12/08/2019 1422   ALBUMIN 4.1 12/08/2019 1422   AST 23 12/08/2019 1422   ALT 15 12/08/2019 1422   ALKPHOS 51 12/08/2019 1422   BILITOT 0.6 12/08/2019 1422   GFRNONAA >60 12/08/2019 1422       Component Value Date/Time   WBC 5.0 12/08/2019 1422   RBC 4.08 12/08/2019 1422   HGB 13.2 12/08/2019 1422   HCT 39.9 12/08/2019 1422   PLT 176 12/08/2019 1422   MCV 97.8 12/08/2019 1422   MCH 32.4 12/08/2019 1422   MCHC 33.1 12/08/2019 1422   RDW 13.2 12/08/2019 1422   LYMPHSABS 1.8 12/08/2019 1422   MONOABS 0.3 12/08/2019 1422   EOSABS 0.2 12/08/2019 1422   BASOSABS 0.0 12/08/2019 1422    No results found for: "POCLITH", "LITHIUM"   No results found for: "PHENYTOIN", "PHENOBARB", "VALPROATE", "CBMZ"   .res Assessment: Plan:    Pt seen for 30 minutes and time spent discussing cognitive side effects and affective dulling and that these are possible side effects of Topamax.  Will decrease Topamax to 25 mg 1/2 tab at bedtime for one week, then stop due to possible cognitive side effects. Continue Viibryd 30 mg po qd for mood and anxiety.  Continue Trazodone 100-150 mg po QHS for insomnia.  Continue Klonopin 1 mg po BID and and 1 tab as needed for anxiety.  Pt to follow-up in 3 months or sooner if  clinically indicated.  Patient advised to contact office with any questions, adverse effects, or acute worsening in signs and symptoms.   Breanna Young was seen today for other and follow-up.  Diagnoses and all orders for this visit:  Generalized anxiety disorder -     Vilazodone HCl (VIIBRYD) 20 MG TABS; Take 1.5 tablets (30 mg total) by mouth daily. -     clonazePAM (KLONOPIN) 1 MG tablet; TAKE 1 TABLET BY MOUTH TWICE A DAY THEN 1 TAB AS NEEDED FOR ANXIETY  Insomnia due to mental disorder -     topiramate (TOPAMAX) 25 MG tablet; Take 1/2 tab at bedtime for one week, then stop. -     traZODone (DESYREL) 100 MG tablet; Take 1.5 tabs po QHS prn insomnia  Intractable migraine with status migrainosus, unspecified migraine type -     topiramate (TOPAMAX) 25 MG tablet; Take 1/2 tab at bedtime for one week, then stop.  Recurrent major depressive disorder, in partial remission (HCC) -     lamoTRIgine (LAMICTAL) 200 MG tablet; Take 1 tablet (200 mg total) by mouth daily. -     Vilazodone HCl (VIIBRYD) 20 MG TABS; Take 1.5 tablets (30 mg total) by mouth daily.  PTSD (post-traumatic stress disorder) -  Vilazodone HCl (VIIBRYD) 20 MG TABS; Take 1.5 tablets (30 mg total) by mouth daily. -     clonazePAM (KLONOPIN) 1 MG tablet; TAKE 1 TABLET BY MOUTH TWICE A DAY THEN 1 TAB AS NEEDED FOR ANXIETY    Please see After Visit Summary for patient specific instructions.  Future Appointments  Date Time Provider [place]  09/21/2021  3:00 PM Inda Coke, [place] [facility]    No orders of the defined types were placed in this encounter.     -------------------------------
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
## 2021-09-26 ENCOUNTER — Telehealth: Payer: Self-pay | Admitting: Physician Assistant

## 2021-09-26 ENCOUNTER — Other Ambulatory Visit: Payer: Self-pay | Admitting: Physician Assistant

## 2021-09-26 DIAGNOSIS — L659 Nonscarring hair loss, unspecified: Secondary | ICD-10-CM

## 2021-09-26 NOTE — Telephone Encounter (Signed)
Patient requests to be scheduled for 3 month labs, however, orders need to be placed for labs for scheduling.

## 2021-09-27 NOTE — Telephone Encounter (Signed)
Vml for patient to call back and sch  

## 2021-09-29 NOTE — Progress Notes (Unsigned)
Benito Mccreedy D.[place] Phone: [phone]   Assessment and Plan:     There are no diagnoses linked to this encounter.  ***   Pertinent previous records reviewed include ***   Follow Up: ***     Subjective:   I, Loreto Loescher, am serving as a Education administrator for Doctor Glennon Mac  Chief Complaint: neck and back pain   HPI:   10/05/2021 Patient is a 41 year old female complaining of neck and back pain. Patient states   Relevant Historical Information: ***  Additional pertinent review of systems negative.   Current Outpatient Medications:    calcium citrate (CALCITRATE - DOSED IN MG ELEMENTAL CALCIUM) 950 (200 Ca) MG tablet, Take 200 mg of elemental calcium by mouth daily., Disp: , Rfl:    CALCIUM-MAGNESIUM PO, Take by mouth., Disp: , Rfl:    [START ON 10/19/2021] clonazePAM (KLONOPIN) 1 MG tablet, TAKE 1 TABLET BY MOUTH TWICE A DAY THEN 1 TAB AS NEEDED FOR ANXIETY, Disp: 90 tablet, Rfl: 2   Collagenase POWD, Take 1 Scoop by mouth daily in the afternoon., Disp: , Rfl:    cyclobenzaprine (FLEXERIL) 10 MG tablet, Take 1 tablet (10 mg total) by mouth 3 (three) times daily as needed for muscle spasms., Disp: 30 tablet, Rfl: 0   docusate sodium (COLACE) 100 MG capsule, Take 100 mg by mouth daily as needed for mild constipation., Disp: , Rfl:    ibuprofen (ADVIL) 600 MG tablet, Take 1 tablet (600 mg total) by mouth every 6 (six) hours as needed., Disp: 30 tablet, Rfl: 0   lamoTRIgine (LAMICTAL) 200 MG tablet, Take 1 tablet (200 mg total) by mouth daily., Disp: 90 tablet, Rfl: 0   levonorgestrel (MIRENA) 20 MCG/24HR IUD, 1 each by Intrauterine route once. , Disp: , Rfl:    Misc Natural Products (SUPER GREENS) POWD, Take 1 Scoop by mouth daily in the afternoon., Disp: , Rfl:    Multiple Vitamin (MULTIVITAMIN ADULT) TABS, Take 1 tablet by mouth 3 (three) times a week., Disp: , Rfl:    Omega-3 Fatty Acids  (FISH OIL) 1000 MG CAPS, Take by mouth., Disp: , Rfl:    ondansetron (ZOFRAN) 4 MG tablet, Take 4 mg by mouth every 8 (eight) hours as needed for nausea or vomiting., Disp: , Rfl:    Probiotic Product (PROBIOTIC PO), Take 1 capsule by mouth daily., Disp: , Rfl:    promethazine (PHENERGAN) 25 MG tablet, Take 1 tablet (25 mg total) by mouth every 6 (six) hours as needed for nausea or vomiting., Disp: 30 tablet, Rfl: 0   SUMAtriptan (IMITREX) 50 MG tablet, TAKE 1 TAB EVERY 2 HRS AS NEEDED FOR MIGRAINE. MAY REPEAT IN 2 HOURS IF HEADACHE PERSISTS OR RECURS., Disp: 8 tablet, Rfl: 1   topiramate (TOPAMAX) 25 MG tablet, Take 1/2 tab at bedtime for one week, then stop. (Patient taking differently: Take 25 mg by mouth daily. Take 1/2 tab at bedtime for one week, then stop.), Disp: 90 tablet, Rfl: 1   traZODone (DESYREL) 150 MG tablet, Take by mouth., Disp: , Rfl:    Vilazodone HCl (VIIBRYD) 20 MG TABS, Take 1.5 tablets (30 mg total) by mouth daily., Disp: 135 tablet, Rfl: 0   Objective:     There were no vitals filed for this visit.    There is no height or weight on file to calculate BMI.    Physical Exam:    ***  Electronically signed by:  Benito Mccreedy D.[facility] 10:34 AM 09/29/21

## 2021-10-04 ENCOUNTER — Other Ambulatory Visit: Payer: Self-pay | Admitting: Psychiatry

## 2021-10-04 DIAGNOSIS — F3341 Major depressive disorder, recurrent, in partial remission: Secondary | ICD-10-CM

## 2021-10-04 DIAGNOSIS — F4310 Post-traumatic stress disorder, unspecified: Secondary | ICD-10-CM

## 2021-10-04 DIAGNOSIS — F411 Generalized anxiety disorder: Secondary | ICD-10-CM

## 2021-10-05 ENCOUNTER — Ambulatory Visit: Payer: BC Managed Care – PPO | Admitting: Sports Medicine

## 2021-10-05 VITALS — BP 122/80 | HR 105 | Ht 65.0 in | Wt 129.0 lb

## 2021-10-05 DIAGNOSIS — G8929 Other chronic pain: Secondary | ICD-10-CM

## 2021-10-05 DIAGNOSIS — M9902 Segmental and somatic dysfunction of thoracic region: Secondary | ICD-10-CM

## 2021-10-05 DIAGNOSIS — M9905 Segmental and somatic dysfunction of pelvic region: Secondary | ICD-10-CM

## 2021-10-05 DIAGNOSIS — M222X2 Patellofemoral disorders, left knee: Secondary | ICD-10-CM

## 2021-10-05 DIAGNOSIS — M222X1 Patellofemoral disorders, right knee: Secondary | ICD-10-CM | POA: Diagnosis not present

## 2021-10-05 DIAGNOSIS — M9908 Segmental and somatic dysfunction of rib cage: Secondary | ICD-10-CM

## 2021-10-05 DIAGNOSIS — M9901 Segmental and somatic dysfunction of cervical region: Secondary | ICD-10-CM

## 2021-10-05 DIAGNOSIS — M542 Cervicalgia: Secondary | ICD-10-CM

## 2021-10-05 DIAGNOSIS — M546 Pain in thoracic spine: Secondary | ICD-10-CM | POA: Diagnosis not present

## 2021-10-05 DIAGNOSIS — M9903 Segmental and somatic dysfunction of lumbar region: Secondary | ICD-10-CM | POA: Diagnosis not present

## 2021-10-05 MED ORDER — MELOXICAM 15 MG PO TABS: 15.0000 mg | ORAL_TABLET | Freq: Every day | ORAL | 0 refills | Status: DC

## 2021-10-05 NOTE — Telephone Encounter (Signed)
PA needed for Vilazodone 20 mg

## 2021-10-05 NOTE — Patient Instructions (Addendum)
Good to see you  - Start meloxicam 15 mg daily x2 weeks.  If still having pain after 2 weeks, complete 3rd-week of meloxicam. May use remaining meloxicam as needed once daily for pain control.  Do not to use additional NSAIDs while taking meloxicam.  May use Tylenol [phone] mg 2 to 3 times a day for breakthrough pain. Neck trap and knee HEP  3 week follow up

## 2021-10-07 NOTE — Telephone Encounter (Signed)
PA required.

## 2021-10-07 NOTE — Telephone Encounter (Signed)
Pt  says she needs refill of Viibryd '30mg'$ .  Pls send to [facility].  She was only able to take a '20mg'$  pill this morning and she is now out.

## 2021-10-10 NOTE — Telephone Encounter (Signed)
Prior Approval received effective 10/10/2021-10/09/2022 with [employer] for Vilazodone 20 mg #135/90 day

## 2021-10-10 NOTE — Telephone Encounter (Signed)
Prior Authorization for generic submitted for  Quantity of 1.5 tablets daily sent for [employer], pending response

## 2021-10-12 DIAGNOSIS — Z1231 Encounter for screening mammogram for malignant neoplasm of breast: Secondary | ICD-10-CM | POA: Diagnosis not present

## 2021-10-12 LAB — HM MAMMOGRAPHY

## 2021-10-14 ENCOUNTER — Other Ambulatory Visit: Payer: Self-pay

## 2021-10-14 DIAGNOSIS — F3341 Major depressive disorder, recurrent, in partial remission: Secondary | ICD-10-CM

## 2021-10-14 DIAGNOSIS — F411 Generalized anxiety disorder: Secondary | ICD-10-CM

## 2021-10-14 DIAGNOSIS — F4310 Post-traumatic stress disorder, unspecified: Secondary | ICD-10-CM

## 2021-10-14 MED ORDER — VILAZODONE HCL 20 MG PO TABS: ORAL_TABLET | ORAL | 0 refills | Status: DC
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
## 2021-10-26 ENCOUNTER — Ambulatory Visit: Payer: BC Managed Care – PPO | Admitting: Sports Medicine

## 2021-10-26 ENCOUNTER — Ambulatory Visit (INDEPENDENT_AMBULATORY_CARE_PROVIDER_SITE_OTHER): Payer: BC Managed Care – PPO

## 2021-10-26 VITALS — BP 110/70 | HR 97 | Ht 65.0 in | Wt 128.0 lb

## 2021-10-26 DIAGNOSIS — G8929 Other chronic pain: Secondary | ICD-10-CM

## 2021-10-26 DIAGNOSIS — M546 Pain in thoracic spine: Secondary | ICD-10-CM

## 2021-10-26 DIAGNOSIS — M542 Cervicalgia: Secondary | ICD-10-CM

## 2021-10-26 DIAGNOSIS — M25512 Pain in left shoulder: Secondary | ICD-10-CM | POA: Diagnosis not present

## 2021-10-26 NOTE — Patient Instructions (Addendum)
Good to see you  Pt referral  4 week follow up   

## 2021-11-03 ENCOUNTER — Encounter: Payer: Self-pay | Admitting: Physician Assistant

## 2021-11-22 ENCOUNTER — Other Ambulatory Visit: Payer: Self-pay | Admitting: Psychiatry

## 2021-11-22 DIAGNOSIS — F4310 Post-traumatic stress disorder, unspecified: Secondary | ICD-10-CM

## 2021-11-22 DIAGNOSIS — F411 Generalized anxiety disorder: Secondary | ICD-10-CM

## 2021-11-22 MED ORDER — CLONAZEPAM 1 MG PO TABS: ORAL_TABLET | ORAL | 2 refills | Status: DC

## 2021-11-22 NOTE — Telephone Encounter (Signed)
Pt LVM @ 7:01a.  She said she is not able to get her Klonopin.  She said the pharmacy shows she can't get it until Dec, but she thinks she should have been able to get it in Oct.  Next appt 12/6
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
## 2021-11-30 ENCOUNTER — Ambulatory Visit (INDEPENDENT_AMBULATORY_CARE_PROVIDER_SITE_OTHER): Payer: BC Managed Care – PPO | Admitting: Sports Medicine

## 2021-11-30 VITALS — BP 108/68 | HR 100 | Ht 65.0 in | Wt 125.0 lb

## 2021-11-30 DIAGNOSIS — G8929 Other chronic pain: Secondary | ICD-10-CM | POA: Diagnosis not present

## 2021-11-30 DIAGNOSIS — M9908 Segmental and somatic dysfunction of rib cage: Secondary | ICD-10-CM

## 2021-11-30 DIAGNOSIS — M9902 Segmental and somatic dysfunction of thoracic region: Secondary | ICD-10-CM | POA: Diagnosis not present

## 2021-11-30 DIAGNOSIS — M546 Pain in thoracic spine: Secondary | ICD-10-CM | POA: Diagnosis not present

## 2021-11-30 DIAGNOSIS — M9901 Segmental and somatic dysfunction of cervical region: Secondary | ICD-10-CM | POA: Diagnosis not present

## 2021-11-30 DIAGNOSIS — M542 Cervicalgia: Secondary | ICD-10-CM

## 2021-11-30 DIAGNOSIS — M25512 Pain in left shoulder: Secondary | ICD-10-CM

## 2021-11-30 NOTE — Patient Instructions (Addendum)
Good to see you  Shoulder HEP  4 week follow up

## 2021-12-20 ENCOUNTER — Telehealth: Payer: Self-pay | Admitting: Physician Assistant

## 2021-12-20 NOTE — Telephone Encounter (Signed)
Pt states: -Was recently referred to [facility] for physical therapy. -doesn't know all of the information about patments.   Pt requests: -Phone Call or MyChart message to explain what the cost would be.

## 2021-12-21 ENCOUNTER — Other Ambulatory Visit (INDEPENDENT_AMBULATORY_CARE_PROVIDER_SITE_OTHER): Payer: BC Managed Care – PPO

## 2021-12-21 ENCOUNTER — Ambulatory Visit (INDEPENDENT_AMBULATORY_CARE_PROVIDER_SITE_OTHER): Payer: BC Managed Care – PPO | Admitting: Psychiatry

## 2021-12-21 ENCOUNTER — Encounter: Payer: Self-pay | Admitting: Psychiatry

## 2021-12-21 DIAGNOSIS — L659 Nonscarring hair loss, unspecified: Secondary | ICD-10-CM

## 2021-12-21 DIAGNOSIS — F3341 Major depressive disorder, recurrent, in partial remission: Secondary | ICD-10-CM | POA: Diagnosis not present

## 2021-12-21 DIAGNOSIS — F411 Generalized anxiety disorder: Secondary | ICD-10-CM | POA: Diagnosis not present

## 2021-12-21 DIAGNOSIS — F4310 Post-traumatic stress disorder, unspecified: Secondary | ICD-10-CM

## 2021-12-21 MED ORDER — LAMOTRIGINE 100 MG PO TABS: ORAL_TABLET | ORAL | 0 refills | Status: DC

## 2021-12-21 MED ORDER — VILAZODONE HCL 20 MG PO TABS: ORAL_TABLET | ORAL | 0 refills | Status: DC

## 2021-12-21 NOTE — Progress Notes (Unsigned)
Breanna Young [id number] [birth year] 41 y.o.  Subjective:   Patient ID:  Breanna Young is a 41 y.o. (DOB [birth year]) female.  Chief Complaint: No chief complaint on file.   HPI Breanna Young presents to the office today for follow-up of *** She reports, "I've been great since I have been off the Topamax." She reports improved energy and motivation. She reports that she now more interested in self-care.   She reports, "I still get panic attacks sometimes." She has panic sx's at times in response to having to be around someone that she does not want to be around. She reports that she has occasional "rage." She reports that she will say things in those situations that she would not usually say that surprises others and herself. She reports that she does not have persistent irritability. Mood has been elevated.   She reports some impulsive buying and excessive spending that she is not able to afford. She reports feeling excessively talkative. Energy and motivation have increased and possibly excessive at times. She reports that others have commented that she has "boundless energy." She reports that she is taking on more activities and doing some other things for an additional source of income. Will clean excessively at times and feel as if she is unable to stop. She reports that her thoughts are racing and has difficulty with concentration. Denies risky behavior. She reports that she will lose her train of thought and has difficulty finding words. Unable to get herself to bed until 2-3 am. Sleeping at least 7 hours a night. Denies hypersexuality. Denies depressed mood. Appetite has been decreased. She reports that she avoids foods that she fears may cause GI s/s. Denies SI or HI.  She reports feeling, "I'm going through this crisis.Marland KitchenMarland Kitchen I literally woke up one day..."  Has not had ETOH in 8 months. 3 times daily.   She reports that she has often been needing Klonopin three times daily. Waking up  easier since she reduced Trazodone to 100 mg po QHS.  Klonopin last filled 11/22/21 x 1  Past Psychiatric Medication Trials: Viibryd- More effective at 40 mg dose and then had more "fogginess" and some sexual side effective. Not effective at 20 mg.  Trintellix- Helpful for depression Zoloft-sexual side effects. Felt more "upbeat."  Lexapro-adverse reaction Wellbutrin XL-increased anxiety and felt fuzzy headed Lamictal- helpful for mood and anxiety. Rexulti-affective dulling, weight gain Trazodone Belsomra-Groggy, blurred vision Ativan Klonopin Xanax- Sleepy Doxazosin-dissociation BuSpar-adverse reaction BC Powder- Ineffective Excedrin Migraine Tizanidine- ineffective for migraines Propranolol Ubrelvy Topamax  GAD-7    Flowsheet Row Office Visit from 11/05/2019 in [place]  Total GAD-7 Score 16      PHQ2-9    [place] Office Visit from 09/21/2021 in [place] Visit from 11/05/2019 in [place]  PHQ-2 Total Score 1 2  PHQ-9 Total Score 5 13        Review of Systems:  Review of Systems  Gastrointestinal:  Positive for constipation. Negative for diarrhea.  Musculoskeletal:  Negative for gait problem.  Neurological:  Negative for headaches.  Psychiatric/Behavioral:         Please refer to HPI    Medications: I have reviewed the patient's current medications.  Current Outpatient Medications  Medication Sig Dispense Refill   Ascorbic Acid (VITAMIN C PO) Take by mouth.     calcium citrate (CALCITRATE - DOSED IN MG ELEMENTAL CALCIUM) 950 (200 Ca) MG tablet Take 200 mg of elemental calcium  by mouth daily.     clonazePAM (KLONOPIN) 1 MG tablet TAKE 1 TABLET BY MOUTH TWICE A DAY THEN 1 TAB AS NEEDED FOR ANXIETY 90 tablet 2   Collagenase POWD Take 1 Scoop by mouth daily in the afternoon.     cyclobenzaprine (FLEXERIL) 10 MG tablet Take 1 tablet (10 mg total) by mouth 3 (three) times  daily as needed for muscle spasms. 30 tablet 0   docusate sodium (COLACE) 100 MG capsule Take 100 mg by mouth daily as needed for mild constipation.     ferrous sulfate 325 (65 FE) MG tablet Take 325 mg by mouth daily with breakfast.     ibuprofen (ADVIL) 600 MG tablet Take 1 tablet (600 mg total) by mouth every 6 (six) hours as needed. 30 tablet 0   lamoTRIgine (LAMICTAL) 200 MG tablet Take 1 tablet (200 mg total) by mouth daily. 90 tablet 0   levonorgestrel (MIRENA) 20 MCG/24HR IUD 1 each by Intrauterine route once.      MAGNESIUM CITRATE PO Take by mouth.     Misc Natural Products (SUPER GREENS) POWD Take 1 Scoop by mouth daily in the afternoon.     Multiple Vitamin (MULTIVITAMIN ADULT) TABS Take 1 tablet by mouth 3 (three) times a week.     Omega-3 Fatty Acids (FISH OIL) 1000 MG CAPS Take by mouth.     Probiotic Product (PROBIOTIC PO) Take 1 capsule by mouth daily.     traZODone (DESYREL) 150 MG tablet Take 100 mg by mouth at bedtime.     Vilazodone HCl 20 MG TABS TAKE 1 AND 1/2 TABLETS DAILY BY MOUTH 135 tablet 0   meloxicam (MOBIC) 15 MG tablet Take 1 tablet (15 mg total) by mouth daily. 30 tablet 0   ondansetron (ZOFRAN) 4 MG tablet Take 4 mg by mouth every 8 (eight) hours as needed for nausea or vomiting. (Patient not taking: Reported on 12/21/2021)     promethazine (PHENERGAN) 25 MG tablet Take 1 tablet (25 mg total) by mouth every 6 (six) hours as needed for nausea or vomiting. (Patient not taking: Reported on 12/21/2021) 30 tablet 0   SUMAtriptan (IMITREX) 50 MG tablet TAKE 1 TAB EVERY 2 HRS AS NEEDED FOR MIGRAINE. MAY REPEAT IN 2 HOURS IF HEADACHE PERSISTS OR RECURS. (Patient not taking: Reported on 12/21/2021) 8 tablet 1   No current facility-administered medications for this visit.    Medication Side Effects: None  Allergies:  Allergies  Allergen Reactions   Aluminum-Containing Compounds     Rash/itchy   Avelox [Moxifloxacin Hcl In Nacl] Other (See Comments)    Unknown  reaction type (possible rash reaction)    Dexlansoprazole Other (See Comments)    Unknown reaction type (possible rash reaction)   Doxazosin Other (See Comments)    Dizziness/faint   Reglan [Metoclopramide]     Hallucinations and a contraindication to her viibyrd    Past Medical History:  Diagnosis Date   Anxiety    Depression    GERD (gastroesophageal reflux disease)    Hiatal hernia    IBS (irritable bowel syndrome)     Past Medical History, Surgical history, Social history, and Family history were reviewed and updated as appropriate.   Please see review of systems for further details on the patient's review from today.   Objective:   Physical Exam:  Wt 125 lb (56.7 kg)   BMI 20.80 kg/m   Physical Exam  Lab Review:     Component Value Date/Time  NA 138 09/21/2021 1617   K 3.4 (L) 09/21/2021 1617   CL 105 09/21/2021 1617   CO2 23 09/21/2021 1617   GLUCOSE 98 09/21/2021 1617   BUN 10 09/21/2021 1617   CREATININE 0.90 09/21/2021 1617   CREATININE 0.86 11/05/2019 1435   CALCIUM 9.9 09/21/2021 1617   PROT 6.9 09/21/2021 1617   ALBUMIN 4.3 09/21/2021 1617   AST 13 09/21/2021 1617   ALT 12 09/21/2021 1617   ALKPHOS 63 09/21/2021 1617   BILITOT 0.4 09/21/2021 1617   GFRNONAA >60 12/08/2019 1422       Component Value Date/Time   WBC 7.2 09/21/2021 1617   RBC 4.53 09/21/2021 1617   HGB 13.9 09/21/2021 1617   HCT 41.7 09/21/2021 1617   PLT 210.0 09/21/2021 1617   MCV 92.0 09/21/2021 1617   MCH 32.4 12/08/2019 1422   MCHC 33.2 09/21/2021 1617   RDW 13.0 09/21/2021 1617   LYMPHSABS 2.3 09/21/2021 1617   MONOABS 0.4 09/21/2021 1617   EOSABS 0.1 09/21/2021 1617   BASOSABS 0.1 09/21/2021 1617    No results found for: "POCLITH", "LITHIUM"   No results found for: "PHENYTOIN", "PHENOBARB", "VALPROATE", "CBMZ"   .res Assessment: Plan:    There are no diagnoses linked to this encounter.   Please see After Visit Summary for patient specific  instructions.  Future Appointments  Date Time Provider [place]  12/21/2021  3:00 PM [facility]  12/28/2021  2:30 PM Glennon Mac, DO [facility] None    No orders of the defined types were placed in this encounter.   -------------------------------

## 2021-12-22 LAB — IBC + FERRITIN
Ferritin: 28.1 ng/mL (ref 10.0–291.0)
Iron: 196 ug/dL — ABNORMAL HIGH (ref 42–145)
Saturation Ratios: 60.3 % — ABNORMAL HIGH (ref 20.0–50.0)
TIBC: 324.8 ug/dL (ref 250.0–450.0)
Transferrin: 232 mg/dL (ref 212.0–360.0)

## 2021-12-22 LAB — CBC WITH DIFFERENTIAL/PLATELET
Basophils Absolute: 0.1 10*3/uL (ref 0.0–0.1)
Basophils Relative: 1.1 % (ref 0.0–3.0)
Eosinophils Absolute: 0.2 10*3/uL (ref 0.0–0.7)
Eosinophils Relative: 1.8 % (ref 0.0–5.0)
HCT: 43.6 % (ref 36.0–46.0)
Hemoglobin: 14.9 g/dL (ref 12.0–15.0)
Lymphocytes Relative: 23.9 % (ref 12.0–46.0)
Lymphs Abs: 2.3 10*3/uL (ref 0.7–4.0)
MCHC: 34.3 g/dL (ref 30.0–36.0)
MCV: 92.2 fl (ref 78.0–100.0)
Monocytes Absolute: 0.5 10*3/uL (ref 0.1–1.0)
Monocytes Relative: 4.8 % (ref 3.0–12.0)
Neutro Abs: 6.5 10*3/uL (ref 1.4–7.7)
Neutrophils Relative %: 68.4 % (ref 43.0–77.0)
Platelets: 189 10*3/uL (ref 150.0–400.0)
RBC: 4.73 Mil/uL (ref 3.87–5.11)
RDW: 13.7 % (ref 11.5–15.5)
WBC: 9.5 10*3/uL (ref 4.0–10.5)

## 2021-12-27 ENCOUNTER — Telehealth: Payer: Self-pay | Admitting: Psychiatry

## 2021-12-27 NOTE — Telephone Encounter (Signed)
Per pt, [facility] for [facility] day supply. Last RX was #90 for 3 per day and they won't refill now that it is due. Pt will run out and can't get [facility] to understand the dosing.Please contact them to get it filled.

## 2021-12-27 NOTE — Telephone Encounter (Signed)
Patient has multiple Rx with refills available. The 09/2021 RF was put in incorrectly as 90 day supply and that is the Rx # she is trying to RF. The pharmacy said they would deactivate that Rx and she can get a RF on the most current RF. Left VM for patient with this information.
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
## 2021-12-28 ENCOUNTER — Ambulatory Visit: Payer: BC Managed Care – PPO | Admitting: Sports Medicine

## 2021-12-28 VITALS — BP 120/80 | HR 78 | Ht 65.0 in | Wt 125.0 lb

## 2021-12-28 DIAGNOSIS — M9902 Segmental and somatic dysfunction of thoracic region: Secondary | ICD-10-CM

## 2021-12-28 DIAGNOSIS — M542 Cervicalgia: Secondary | ICD-10-CM

## 2021-12-28 DIAGNOSIS — M25512 Pain in left shoulder: Secondary | ICD-10-CM | POA: Diagnosis not present

## 2021-12-28 DIAGNOSIS — M546 Pain in thoracic spine: Secondary | ICD-10-CM | POA: Diagnosis not present

## 2021-12-28 DIAGNOSIS — M9901 Segmental and somatic dysfunction of cervical region: Secondary | ICD-10-CM | POA: Diagnosis not present

## 2021-12-28 DIAGNOSIS — G8929 Other chronic pain: Secondary | ICD-10-CM | POA: Diagnosis not present

## 2021-12-28 DIAGNOSIS — M9903 Segmental and somatic dysfunction of lumbar region: Secondary | ICD-10-CM | POA: Diagnosis not present

## 2021-12-28 MED ORDER — MELOXICAM 7.5 MG PO TABS: 7.5000 mg | ORAL_TABLET | Freq: Every day | ORAL | 0 refills | Status: DC

## 2021-12-28 NOTE — Patient Instructions (Addendum)
Good to see you - Start meloxicam 7.5  mg daily x2 weeks.  Can increase to 2 tablets 15 mg I need be. If still having pain after 2 weeks, complete 3rd-week of meloxicam. May use remaining meloxicam as needed once daily for pain control.  Do not to use additional NSAIDs while taking meloxicam.  May use Tylenol [id number] mg 2 to 3 times a day for breakthrough pain. Neck and trap HEP  3-4 week follow up

## 2022-02-01 ENCOUNTER — Ambulatory Visit: Payer: BC Managed Care – PPO | Admitting: Sports Medicine

## 2022-02-08 ENCOUNTER — Ambulatory Visit: Payer: BC Managed Care – PPO | Admitting: Psychiatry
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
## 2022-02-22 ENCOUNTER — Ambulatory Visit: Payer: BC Managed Care – PPO | Admitting: Psychiatry

## 2022-02-22 ENCOUNTER — Ambulatory Visit (INDEPENDENT_AMBULATORY_CARE_PROVIDER_SITE_OTHER): Payer: 59 | Admitting: Psychiatry

## 2022-02-22 ENCOUNTER — Ambulatory Visit: Payer: 59 | Admitting: Sports Medicine

## 2022-02-22 ENCOUNTER — Encounter: Payer: Self-pay | Admitting: Psychiatry

## 2022-02-22 VITALS — Wt 116.0 lb

## 2022-02-22 VITALS — BP 118/80 | HR 88 | Ht 65.0 in | Wt 119.0 lb

## 2022-02-22 DIAGNOSIS — R69 Illness, unspecified: Secondary | ICD-10-CM | POA: Diagnosis not present

## 2022-02-22 DIAGNOSIS — F3341 Major depressive disorder, recurrent, in partial remission: Secondary | ICD-10-CM

## 2022-02-22 DIAGNOSIS — F4310 Post-traumatic stress disorder, unspecified: Secondary | ICD-10-CM | POA: Diagnosis not present

## 2022-02-22 DIAGNOSIS — M542 Cervicalgia: Secondary | ICD-10-CM | POA: Diagnosis not present

## 2022-02-22 DIAGNOSIS — F411 Generalized anxiety disorder: Secondary | ICD-10-CM | POA: Diagnosis not present

## 2022-02-22 DIAGNOSIS — M546 Pain in thoracic spine: Secondary | ICD-10-CM | POA: Diagnosis not present

## 2022-02-22 DIAGNOSIS — G8929 Other chronic pain: Secondary | ICD-10-CM | POA: Diagnosis not present

## 2022-02-22 DIAGNOSIS — F5105 Insomnia due to other mental disorder: Secondary | ICD-10-CM

## 2022-02-22 MED ORDER — CLONAZEPAM 1 MG PO TABS: ORAL_TABLET | ORAL | 2 refills | Status: DC

## 2022-02-22 MED ORDER — VILAZODONE HCL 20 MG PO TABS: ORAL_TABLET | ORAL | 0 refills | Status: DC

## 2022-02-22 MED ORDER — LAMOTRIGINE 100 MG PO TABS: ORAL_TABLET | ORAL | 0 refills | Status: DC

## 2022-02-22 MED ORDER — METHYLPREDNISOLONE 4 MG PO TBPK: ORAL_TABLET | ORAL | 0 refills | Status: DC

## 2022-02-22 MED ORDER — TRAZODONE HCL 100 MG PO TABS: 100.0000 mg | ORAL_TABLET | Freq: Every day | ORAL | 0 refills | Status: DC

## 2022-02-22 NOTE — Patient Instructions (Addendum)
Good to see you Prednisone dos pak Call us and let us if you would like  PT referral  3-4 week follow up

## 2022-02-22 NOTE — Progress Notes (Signed)
Finna Mathiasen [id number] 1980-02-20 42 y.o.  Subjective:   Patient ID:  Breanna Young is a 42 y.o. (DOB 23-Mar-1980) female.  Chief Complaint:  Chief Complaint  Patient presents with   Medication Problem    Possible cognitive side effects    HPI Breanna Young presents to the office today for follow-up of anxiety, mood disturbance, and insomnia.    She reports that her anger has been less since decrease in Lamictal. She continues to have some difficulty with word finding and reports that this is "better" on 150 mg of lamictal compared to 200 mg. She reports that at times she is preoccupied with her own thoughts and not paying attention to conversations. She reports that she is not sure if she is dissociating at times and sometimes feels like she is "just going through the motions."   She reports that she found journaling her thoughts to be helpful for her anger.   She reports that her concentration has been impaired. She reports that she is having difficulty "absorbing the information" in conversation. She describes some racing thoughts. She has occasionally had to take 2 lamictal 100 mg tabs. She reports that she has been able to control excessive talkativeness and feels "less hyper." She reports that people are no longer making comments on behavior changes. She reports that she continues to spend excessively. She had to buy new clothes that fit her. She reports that she is continuing with Botox treatments and put skin care regimen on the back burner. She reports that she will occasionally feel sad for brief periods in response to certain circumstances. Denies persistent depression. Sleeping 7-8 hours a night. Rarely having dreams, "but I wake up with my mind running." She reports that Trazodone 100 mg has been working well with less excessive somnolence. Energy and motivation have been good. She reprts that she is now able to stop projects and space them out instead of feeling like she has  to do it all at once. Appetite varies. She reports avoiding certain foods, ie. Starches, that exacerbate GI issues. Denies SI.   She reports that her anxiety has been "up and down." She has occasionally felt the start of panic and needed Klonopin prn. Some worry and rumination.   She reports that her boyfriend has been experiencing some depression and is not interacting with her as much.   She has been getting out more and spending time with friends. Went hiking with a friend  this weekend. She periodically will have lunch with friends.   Klonopin last filled 02/09/22.   Past Psychiatric Medication Trials: Viibryd- More effective at 40 mg dose and then had more "fogginess" and some sexual side effective. Not effective at 20 mg.  Trintellix- Helpful for depression Zoloft-sexual side effects. Felt more "upbeat."  Lexapro-adverse reaction Wellbutrin XL-increased anxiety and felt fuzzy headed Lamictal- helpful for mood and anxiety. Rexulti-affective dulling, weight gain Trazodone Belsomra-Groggy, blurred vision Ativan Klonopin Xanax- Sleepy Doxazosin-dissociation BuSpar-adverse reaction BC Powder- Ineffective Excedrin Migraine Tizanidine- ineffective for migraines Propranolol Ubrelvy Topamax   GAD-7    Flowsheet Row Office Visit from 11/05/2019 in [place]  Total GAD-7 Score 16      PHQ2-9    [place] Office Visit from 09/21/2021 in [place] Visit from 11/05/2019 in [place]  PHQ-2 Total Score 1 2  PHQ-9 Total Score 5 13        Review of Systems:  Review of Systems  Gastrointestinal:  GI symptoms after eating certain foods  Musculoskeletal:  Negative for gait problem.       Has been seeing sports medicine specialist to help with shoulder pain and activities for reconditioning.  Psychiatric/Behavioral:         Please refer to HPI    Medications: I have reviewed the  patient's current medications.  Current Outpatient Medications  Medication Sig Dispense Refill   Ascorbic Acid (VITAMIN C PO) Take by mouth.     Cholecalciferol (VITAMIN D3) 125 MCG (5000 UT) CAPS Take by mouth.     ferrous sulfate 325 (65 FE) MG tablet Take 325 mg by mouth daily with breakfast.     calcium citrate (CALCITRATE - DOSED IN MG ELEMENTAL CALCIUM) 950 (200 Ca) MG tablet Take 200 mg of elemental calcium by mouth daily.     [START ON 03/09/2022] clonazePAM (KLONOPIN) 1 MG tablet TAKE 1 TABLET BY MOUTH TWICE A DAY THEN 1 TAB AS NEEDED FOR ANXIETY 90 tablet 2   Collagenase POWD Take 1 Scoop by mouth daily in the afternoon.     cyclobenzaprine (FLEXERIL) 10 MG tablet Take 1 tablet (10 mg total) by mouth 3 (three) times daily as needed for muscle spasms. 30 tablet 0   docusate sodium (COLACE) 100 MG capsule Take 100 mg by mouth daily as needed for mild constipation.     ibuprofen (ADVIL) 600 MG tablet Take 1 tablet (600 mg total) by mouth every 6 (six) hours as needed. 30 tablet 0   lamoTRIgine (LAMICTAL) 100 MG tablet Take 1-1.5 tabs po qd 180 tablet 0   levonorgestrel (MIRENA) 20 MCG/24HR IUD 1 each by Intrauterine route once.      MAGNESIUM CITRATE PO Take by mouth.     meloxicam (MOBIC) 15 MG tablet Take 1 tablet (15 mg total) by mouth daily. 30 tablet 0   meloxicam (MOBIC) 7.5 MG tablet Take 1 tablet (7.5 mg total) by mouth daily. 30 tablet 0   methylPREDNISolone (MEDROL DOSEPAK) 4 MG TBPK tablet Take 6 tablets on day 1.  Take 5 tablets on day 2.  Take 4 tablets on day 3.  Take 3 tablets on day 4.  Take 2 tablets on day 5.  Take 1 tablet on day 6. 21 tablet 0   Misc Natural Products (SUPER GREENS) POWD Take 1 Scoop by mouth daily in the afternoon.     Multiple Vitamin (MULTIVITAMIN ADULT) TABS Take 1 tablet by mouth 3 (three) times a week.     Omega-3 Fatty Acids (FISH OIL) 1000 MG CAPS Take by mouth.     ondansetron (ZOFRAN) 4 MG tablet Take 4 mg by mouth every 8 (eight) hours as  needed for nausea or vomiting.     Probiotic Product (PROBIOTIC PO) Take 1 capsule by mouth daily.     promethazine (PHENERGAN) 25 MG tablet Take 1 tablet (25 mg total) by mouth every 6 (six) hours as needed for nausea or vomiting. 30 tablet 0   SUMAtriptan (IMITREX) 50 MG tablet TAKE 1 TAB EVERY 2 HRS AS NEEDED FOR MIGRAINE. MAY REPEAT IN 2 HOURS IF HEADACHE PERSISTS OR RECURS. 8 tablet 1   traZODone (DESYREL) 100 MG tablet Take 1 tablet (100 mg total) by mouth at bedtime. 90 tablet 0   Vilazodone HCl 20 MG TABS TAKE 1 AND 1/2 TABLETS DAILY BY MOUTH 135 tablet 0   No current facility-administered medications for this visit.    Medication Side Effects: Other: Word finding difficulties and impaired concentration  Allergies:  Allergies  Allergen Reactions   Aluminum-Containing Compounds     Rash/itchy   Avelox [Moxifloxacin Hcl In Nacl] Other (See Comments)    Unknown reaction type (possible rash reaction)    Dexlansoprazole Other (See Comments)    Unknown reaction type (possible rash reaction)   Doxazosin Other (See Comments)    Dizziness/faint   Reglan [Metoclopramide]     Hallucinations and a contraindication to her viibyrd    Past Medical History:  Diagnosis Date   Anxiety    Depression    GERD (gastroesophageal reflux disease)    Hiatal hernia    IBS (irritable bowel syndrome)     Past Medical History, Surgical history, Social history, and Family history were reviewed and updated as appropriate.   Please see review of systems for further details on the patient's review from today.   Objective:   Physical Exam:  Wt 116 lb (52.6 kg)   BMI 19.30 kg/m   Physical Exam Constitutional:      General: She is not in acute distress. Musculoskeletal:        General: No deformity.  Neurological:     Mental Status: She is alert and oriented to person, place, and time.     Coordination: Coordination normal.  Psychiatric:        Attention and Perception: Attention and  perception normal. She does not perceive auditory or visual hallucinations.        Mood and Affect: Mood is anxious. Mood is not depressed. Affect is not labile, blunt, angry or inappropriate.        Speech: Speech normal.        Behavior: Behavior normal.        Thought Content: Thought content normal. Thought content is not paranoid or delusional. Thought content does not include homicidal or suicidal ideation. Thought content does not include homicidal or suicidal plan.        Cognition and Memory: Cognition and memory normal.        Judgment: Judgment normal.     Comments: Insight intact     Lab Review:     Component Value Date/Time   NA 138 09/21/2021 1617   K 3.4 (L) 09/21/2021 1617   CL 105 09/21/2021 1617   CO2 23 09/21/2021 1617   GLUCOSE 98 09/21/2021 1617   BUN 10 09/21/2021 1617   CREATININE 0.90 09/21/2021 1617   CREATININE 0.86 11/05/2019 1435   CALCIUM 9.9 09/21/2021 1617   PROT 6.9 09/21/2021 1617   ALBUMIN 4.3 09/21/2021 1617   AST 13 09/21/2021 1617   ALT 12 09/21/2021 1617   ALKPHOS 63 09/21/2021 1617   BILITOT 0.4 09/21/2021 1617   GFRNONAA >60 12/08/2019 1422       Component Value Date/Time   WBC 9.5 12/21/2021 1514   RBC 4.73 12/21/2021 1514   HGB 14.9 12/21/2021 1514   HCT 43.6 12/21/2021 1514   PLT 189.0 12/21/2021 1514   MCV 92.2 12/21/2021 1514   MCH 32.4 12/08/2019 1422   MCHC 34.3 12/21/2021 1514   RDW 13.7 12/21/2021 1514   LYMPHSABS 2.3 12/21/2021 1514   MONOABS 0.5 12/21/2021 1514   EOSABS 0.2 12/21/2021 1514   BASOSABS 0.1 12/21/2021 1514    No results found for: "POCLITH", "LITHIUM"   No results found for: "PHENYTOIN", "PHENOBARB", "VALPROATE", "CBMZ"   .res Assessment: Plan:    Pt seen for 30 minutes and time spent discussing treatment options to include reducing Lamictal since she reports some improvement in  cognitive side effects and mood with decrease in Lamictal from 200 mg to 150 mg po qd. Discussed option to further  decrease Lamictal to determine if this may continue to reduce possible side effects. Pt agrees to trial of decrease in Lamictal. Advised pt to resume Lamictal 150 mg po qd if she experiences any worsening symptoms.  Continue Viibryd 30 mg po qd for anxiety and depression.  Continue Trazodone 100 mg po QHS for insomnia.  Continue Klonopin 1 mg po BID and 1 tab as needed for anxiety.  Pt to follow-up in 3 months or sooner if clinically indicated.  Patient advised to contact office with any questions, adverse effects, or acute worsening in signs and symptoms.   Yurianna was seen today for medication problem.  Diagnoses and all orders for this visit:  Insomnia due to mental disorder -     traZODone (DESYREL) 100 MG tablet; Take 1 tablet (100 mg total) by mouth at bedtime.  Recurrent major depressive disorder, in partial remission (HCC) -     lamoTRIgine (LAMICTAL) 100 MG tablet; Take 1-1.5 tabs po qd -     Vilazodone HCl 20 MG TABS; TAKE 1 AND 1/2 TABLETS DAILY BY MOUTH  Generalized anxiety disorder -     clonazePAM (KLONOPIN) 1 MG tablet; TAKE 1 TABLET BY MOUTH TWICE A DAY THEN 1 TAB AS NEEDED FOR ANXIETY -     Vilazodone HCl 20 MG TABS; TAKE 1 AND 1/2 TABLETS DAILY BY MOUTH  PTSD (post-traumatic stress disorder) -     clonazePAM (KLONOPIN) 1 MG tablet; TAKE 1 TABLET BY MOUTH TWICE A DAY THEN 1 TAB AS NEEDED FOR ANXIETY -     Vilazodone HCl 20 MG TABS; TAKE 1 AND 1/2 TABLETS DAILY BY MOUTH     Please see After Visit Summary for patient specific instructions.  Future Appointments  Date Time Provider [place]  05/24/2022  1:30 PM [name], PMHNP [facility]-CP None    No orders of the defined types were placed in this encounter.   -------------------------------

## 2022-03-14 DIAGNOSIS — Z9889 Other specified postprocedural states: Secondary | ICD-10-CM | POA: Diagnosis not present

## 2022-03-14 DIAGNOSIS — K2100 Gastro-esophageal reflux disease with esophagitis, without bleeding: Secondary | ICD-10-CM | POA: Diagnosis not present

## 2022-03-14 DIAGNOSIS — K3184 Gastroparesis: Secondary | ICD-10-CM | POA: Diagnosis not present

## 2022-03-15 ENCOUNTER — Other Ambulatory Visit: Payer: Self-pay | Admitting: Psychiatry

## 2022-03-15 DIAGNOSIS — F411 Generalized anxiety disorder: Secondary | ICD-10-CM

## 2022-03-15 DIAGNOSIS — F4310 Post-traumatic stress disorder, unspecified: Secondary | ICD-10-CM

## 2022-03-15 DIAGNOSIS — F3341 Major depressive disorder, recurrent, in partial remission: Secondary | ICD-10-CM

## 2022-04-21 ENCOUNTER — Telehealth: Payer: Self-pay

## 2022-04-21 ENCOUNTER — Other Ambulatory Visit: Payer: Self-pay | Admitting: Psychiatry

## 2022-04-21 DIAGNOSIS — F411 Generalized anxiety disorder: Secondary | ICD-10-CM

## 2022-04-21 DIAGNOSIS — F4310 Post-traumatic stress disorder, unspecified: Secondary | ICD-10-CM

## 2022-04-21 DIAGNOSIS — F3341 Major depressive disorder, recurrent, in partial remission: Secondary | ICD-10-CM

## 2022-04-21 NOTE — Telephone Encounter (Signed)
Prior Authorization submitted with [employer] for Vilazodone 20 mg 1.5 tabs daily #135/90 day,

## 2022-04-25 NOTE — Telephone Encounter (Signed)
Per [employer] on 04/21/2022 a test claim was ran for #135/90 day Vilazodone 20 mg tablets and it was paid for. No PA needed at this time.

## 2022-05-24 ENCOUNTER — Ambulatory Visit (INDEPENDENT_AMBULATORY_CARE_PROVIDER_SITE_OTHER): Payer: 59 | Admitting: Psychiatry

## 2022-05-24 ENCOUNTER — Encounter: Payer: Self-pay | Admitting: Psychiatry

## 2022-05-24 DIAGNOSIS — F3341 Major depressive disorder, recurrent, in partial remission: Secondary | ICD-10-CM

## 2022-05-24 DIAGNOSIS — F5105 Insomnia due to other mental disorder: Secondary | ICD-10-CM

## 2022-05-24 DIAGNOSIS — F411 Generalized anxiety disorder: Secondary | ICD-10-CM | POA: Diagnosis not present

## 2022-05-24 DIAGNOSIS — F4310 Post-traumatic stress disorder, unspecified: Secondary | ICD-10-CM

## 2022-05-24 MED ORDER — TRAZODONE HCL 100 MG PO TABS: 100.0000 mg | ORAL_TABLET | Freq: Every day | ORAL | 1 refills | Status: DC

## 2022-05-24 MED ORDER — LAMOTRIGINE 100 MG PO TABS: ORAL_TABLET | ORAL | 0 refills | Status: DC

## 2022-05-24 MED ORDER — CLONAZEPAM 1 MG PO TABS: ORAL_TABLET | ORAL | 5 refills | Status: DC

## 2022-05-24 MED ORDER — VILAZODONE HCL 20 MG PO TABS: ORAL_TABLET | ORAL | 0 refills | Status: DC

## 2022-05-24 NOTE — Progress Notes (Signed)
Breanna Young [id number] [birth year] 42 y.o.  Subjective:   Patient ID:  Breanna Young is a 42 y.o. (DOB [birth year]) female.  Chief Complaint:  Chief Complaint  Patient presents with   Other    Concentration difficulties   Anxiety    HPI Breanna Young presents to the office today for follow-up of anxiety, depression, and insomnia. She reports, "I 'm still having some trouble with my concentration." She reports difficulty retaining what someone just said and this is often happening when she is worrying about things. Denies any increase in losing or misplacing things. She reports that she is occasionally having to read a sentence again to understand its meaning. 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 She reports racing thoughts. She reports that she will leave out words when texting "because my mind is working faster than my hands."  Denies persistent sad mood. "I feel like I have been lazier, more so than depressed." Appetite has been "fairly good." She reports that she now recognizes which foods she can tolerate. She reports eating small amounts during the day and occasionally has to make herself eat. She reports that she now has some food aversions. Denies diminished interest in things. Denies SI.   She reports that her stress has been increased. Motivation has been low. Energy has been slightly lower and feels she is moving slower. She reports that she is sleeping about 8 hours and it does not feel rested upon awakening. She is awakened about 3 times sweating, "butterflies," and increased HR. She reports some rumination and frequent worry. She reports that she feels on the verge of panic at times and will need to take medication to control this. She reports some flashbacks and intrusive memories. "I'm feeling  very overwhelmed."   Denies impulsive or risky behavior. Denies increased talkativeness.   She reports that she now is able to experience more sadness with lower doses of Lamictal. She reports that she has had some increased irritability. She reports "moments of rage." She reports word finding difficulties have lessened.   Grandfather passed away in 05/03/22. She reports that she had not seen family for awhile and this caused her to feel "very emotional." She reports that she continues to have different emotions since then.   She has not been able to be as   She reports lowering Trazodone has been helpful and she is not feeling as drowsy. She reports lowering Lamictal has caused her to feel less "foggy."  Klonopin last filled 04/22/22 x 2.   Past Psychiatric Medication Trials: Viibryd- More effective at 40 mg dose and then had more "fogginess" and some sexual side effective. Not effective at 20 mg.  Trintellix- Helpful for depression Zoloft-sexual side effects. Felt more "upbeat."  Lexapro-adverse reaction Wellbutrin XL-increased anxiety and felt fuzzy headed Lamictal- helpful for mood and anxiety. Rexulti-affective dulling, weight gain Trazodone Belsomra-Groggy, blurred vision Ativan Klonopin Xanax- Sleepy Doxazosin-dissociation BuSpar-adverse reaction BC Powder- Ineffective Excedrin Migraine Tizanidine- ineffective for migraines Propranolol Ubrelvy Topamax  GAD-7    Flowsheet Row Office Visit from 11/05/2019 in [facility]  Total GAD-7 Score 16      PHQ2-9    Flowsheet Row Office Visit from 09/21/2021 in [facility] [employer] from 11/05/2019 in [facility] [place]  PHQ-2 Total Score 1 2  PHQ-9 Total Score 5 13        Review of Systems:  Review of Systems  Gastrointestinal:  Positive for abdominal pain. Negative for nausea.  Musculoskeletal:  Positive for back pain. Negative for gait problem.  Skin:         Hair has been thinning. Reports increase skin sensitivity. Reports that her skin changes from oily to dry.   Psychiatric/Behavioral:         Please refer to HPI    Medications: I have reviewed the patient's current medications.  Current Outpatient Medications  Medication Sig Dispense Refill   Ascorbic Acid (VITAMIN C PO) Take by mouth.     calcium citrate (CALCITRATE - DOSED IN MG ELEMENTAL CALCIUM) 950 (200 Ca) MG tablet Take 200 mg of elemental calcium by mouth daily.     Cholecalciferol (VITAMIN D3) 125 MCG (5000 UT) CAPS Take by mouth.     Collagenase POWD Take 1 Scoop by mouth daily in the afternoon.     cyclobenzaprine (FLEXERIL) 10 MG tablet Take 1 tablet (10 mg total) by mouth 3 (three) times daily as needed for muscle spasms. 30 tablet 0   docusate sodium (COLACE) 100 MG capsule Take 100 mg by mouth daily as needed for mild constipation.     ferrous sulfate 325 (65 FE) MG tablet Take 325 mg by mouth daily with breakfast.     ibuprofen (ADVIL) 600 MG tablet Take 1 tablet (600 mg total) by mouth every 6 (six) hours as needed. 30 tablet 0   levonorgestrel (MIRENA) 20 MCG/24HR IUD 1 each by Intrauterine route once.      MAGNESIUM CITRATE PO Take by mouth.     Misc Natural Products (SUPER GREENS) POWD Take 1 Scoop by mouth daily in the afternoon.     Multiple Vitamin (MULTIVITAMIN ADULT) TABS Take 1 tablet by mouth 3 (three) times a week.     Omega-3 Fatty Acids (FISH OIL) 1000 MG CAPS Take by mouth.     ondansetron (ZOFRAN) 4 MG tablet Take 4 mg by mouth every 8 (eight) hours as needed for nausea or vomiting.     Probiotic Product (PROBIOTIC PO) Take 1 capsule by mouth daily.     promethazine (PHENERGAN) 25 MG tablet Take 1 tablet (25 mg total) by mouth every 6 (six) hours as needed for nausea or vomiting. 30 tablet 0   SUMAtriptan (IMITREX) 50 MG tablet TAKE 1 TAB EVERY 2 HRS AS NEEDED FOR MIGRAINE. MAY REPEAT IN 2 HOURS IF HEADACHE PERSISTS OR RECURS. 8 tablet 1   [START ON  06/21/2022] clonazePAM (KLONOPIN) 1 MG tablet TAKE 1 TABLET BY MOUTH TWICE A DAY THEN 1 TAB AS NEEDED FOR ANXIETY 90 tablet 5   lamoTRIgine (LAMICTAL) 100 MG tablet TAKE 1 to 1.5 TABS BY MOUTH EVERY DAY 135 tablet 0   meloxicam (MOBIC) 15 MG tablet Take 1 tablet (15 mg total) by mouth daily. (Patient not taking: Reported on 05/24/2022) 30 tablet 0   meloxicam (MOBIC) 7.5 MG tablet Take 1 tablet (7.5 mg total) by mouth daily. (Patient not taking: Reported on 05/24/2022) 30 tablet 0   traZODone (DESYREL) 100 MG tablet Take 1 tablet (100 mg total) by mouth at bedtime. 90 tablet 1   Vilazodone HCl 20 MG TABS TAKE 1 AND 1/2 TABLETS DAILY BY MOUTH 135 tablet 0   No current facility-administered medications for this visit.    Medication Side Effects: None  Allergies:  Allergies  Allergen Reactions   Aluminum-Containing Compounds     Rash/itchy   Avelox [Moxifloxacin Hcl In Nacl] Other (See Comments)    Unknown reaction type (possible rash reaction)    Dexlansoprazole Other (See Comments)    Unknown reaction type (possible rash reaction)   Doxazosin Other (See Comments)    Dizziness/faint   Reglan [Metoclopramide]     Hallucinations and a contraindication to her viibyrd    Past Medical History:  Diagnosis Date   Anxiety    Depression    GERD (gastroesophageal reflux disease)    Hiatal hernia    IBS (irritable bowel syndrome)     Past Medical History, Surgical history, Social history, and Family history were reviewed and updated as appropriate.   Please see review of systems for further details on the patient's review from today.   Objective:   Physical Exam:  BP 103/63   Pulse 75   Physical Exam Constitutional:      General: She is not in acute distress. Musculoskeletal:        General: No deformity.  Neurological:     Mental Status: She is alert and oriented to person, place, and time.     Coordination: Coordination normal.  Psychiatric:        Attention and Perception:  Attention and perception normal. She does not perceive auditory or visual hallucinations.        Mood and Affect: Mood is anxious. Mood is not depressed. Affect is not labile, blunt, angry or inappropriate.        Speech: Speech normal.        Behavior: Behavior normal.        Thought Content: Thought content normal. Thought content is not paranoid or delusional. Thought content does not include homicidal or suicidal ideation. Thought content does not include homicidal or suicidal plan.        Cognition and Memory: Cognition and memory normal.        Judgment: Judgment normal.     Comments: Insight intact     Lab Review:     Component Value Date/Time   NA 138 09/21/2021 1617   K 3.4 (L) 09/21/2021 1617   CL 105 09/21/2021 1617   CO2 23 09/21/2021 1617   GLUCOSE 98 09/21/2021 1617   BUN 10 09/21/2021 1617   CREATININE 0.90 09/21/2021 1617   CREATININE 0.86 11/05/2019 1435   CALCIUM 9.9 09/21/2021 1617   PROT 6.9 09/21/2021 1617   ALBUMIN 4.3 09/21/2021 1617   AST 13 09/21/2021 1617   ALT 12 09/21/2021 1617   ALKPHOS 63 09/21/2021 1617   BILITOT 0.4 09/21/2021 1617   GFRNONAA >60 12/08/2019 1422       Component Value Date/Time   WBC 9.5 12/21/2021 1514   RBC 4.73 12/21/2021 1514   HGB 14.9 12/21/2021 1514   HCT 43.6 12/21/2021 1514   PLT 189.0 12/21/2021 1514   MCV 92.2 12/21/2021 1514   MCH 32.4 12/08/2019 1422   MCHC 34.3 12/21/2021 1514   RDW 13.7 12/21/2021 1514   LYMPHSABS 2.3 12/21/2021 1514   MONOABS 0.5 12/21/2021 1514   EOSABS 0.2 12/21/2021 1514   BASOSABS 0.1 12/21/2021 1514    No results found for: "POCLITH", "LITHIUM"   No results found for: "PHENYTOIN", "PHENOBARB", "VALPROATE", "CBMZ"   .res Assessment: Plan:   Discussed recent increase in anxiety and that concentration difficulties seem to be in response to distractibility/pre-occupation with anxious thoughts. Discussed that anxiety seems to be exacerbated by increased stress and  trauma related  triggers. Discussed option of medication change and/or resuming therapy to address anxiety. Pt reports that she would prefer to process recent events with a therapist since she feels that anxiety may be "situational" and that overall current medications seem to be effective and well tolerated for her. She reports that she has a therapist in mind that she may want to see. Discussed considering Clonidine if anxiety and nightmares do not improve. Discussed potential benefits, risks, and side effects of Clonidine.  Continue Lamictal 100 mg po qd for mood symptoms.  Continue Viibryd 30 mg po qd for anxiety and depression.  Continue Trazodone for insomnia.  Continue Klonopin 1 mg po BID and 1 tab as needed for anxiety.  Pt to follow-up in 3 months or sooner if clinically indicated.  Patient advised to contact office with any questions, adverse effects, or acute worsening in signs and symptoms.  I spent 35 minutes dedicated to the care of this patient on the date of this  encounter to include pre-visit review of records, face-to-face time with the patient discussing anxiety, ordering of medication, and post visit documentation.   Berra was seen today for other and anxiety.  Diagnoses and all orders for this visit:  Generalized anxiety disorder -     clonazePAM (KLONOPIN) 1 MG tablet; TAKE 1 TABLET BY MOUTH TWICE A DAY THEN 1 TAB AS NEEDED FOR ANXIETY -     Vilazodone HCl 20 MG TABS; TAKE 1 AND 1/2 TABLETS DAILY BY MOUTH  PTSD (post-traumatic stress disorder) -     clonazePAM (KLONOPIN) 1 MG tablet; TAKE 1 TABLET BY MOUTH TWICE A DAY THEN 1 TAB AS NEEDED FOR ANXIETY -     Vilazodone HCl 20 MG TABS; TAKE 1 AND 1/2 TABLETS DAILY BY MOUTH  Recurrent major depressive disorder, in partial remission (HCC) -     lamoTRIgine (LAMICTAL) 100 MG tablet; TAKE 1 to 1.5 TABS BY MOUTH EVERY DAY -     Vilazodone HCl 20 MG TABS; TAKE 1 AND 1/2 TABLETS DAILY BY MOUTH  Insomnia due to mental disorder -      traZODone (DESYREL) 100 MG tablet; Take 1 tablet (100 mg total) by mouth at bedtime.     Please see After Visit Summary for patient specific instructions.  Future Appointments  Date Time Provider Department Center  08/30/2022  1:15 PM Corie Chiquito, PMHNP [facility] None    No orders of the defined types were placed in this encounter.   -------------------------------

## 2022-06-21 DIAGNOSIS — Z01419 Encounter for gynecological examination (general) (routine) without abnormal findings: Secondary | ICD-10-CM | POA: Diagnosis not present

## 2022-06-21 DIAGNOSIS — Z1151 Encounter for screening for human papillomavirus (HPV): Secondary | ICD-10-CM | POA: Diagnosis not present

## 2022-06-21 LAB — RESULTS CONSOLE HPV: CHL HPV: NEGATIVE

## 2022-08-30 ENCOUNTER — Encounter: Payer: Self-pay | Admitting: Psychiatry

## 2022-08-30 ENCOUNTER — Telehealth (INDEPENDENT_AMBULATORY_CARE_PROVIDER_SITE_OTHER): Payer: 59 | Admitting: Psychiatry

## 2022-08-30 DIAGNOSIS — F5105 Insomnia due to other mental disorder: Secondary | ICD-10-CM

## 2022-08-30 DIAGNOSIS — F411 Generalized anxiety disorder: Secondary | ICD-10-CM | POA: Diagnosis not present

## 2022-08-30 DIAGNOSIS — F4310 Post-traumatic stress disorder, unspecified: Secondary | ICD-10-CM

## 2022-08-30 DIAGNOSIS — F3341 Major depressive disorder, recurrent, in partial remission: Secondary | ICD-10-CM | POA: Diagnosis not present

## 2022-08-30 MED ORDER — LAMOTRIGINE 100 MG PO TABS: ORAL_TABLET | ORAL | 1 refills | Status: DC

## 2022-08-30 MED ORDER — VILAZODONE HCL 20 MG PO TABS
ORAL_TABLET | ORAL | 1 refills | Status: DC
Start: 2022-08-30 — End: 2022-11-27

## 2022-08-30 NOTE — Progress Notes (Signed)
Breanna Young [id number] 1980-10-28 42 y.o.  Virtual Visit via Video Note  I connected with pt @ on 08/30/22 at  1:15 PM EDT by a video enabled telemedicine application and verified that I am speaking with the correct person using two identifiers.   I discussed the limitations of evaluation and management by telemedicine and the availability of in person appointments. The patient expressed understanding and agreed to proceed.  I discussed the assessment and treatment plan with the patient. The patient was provided an opportunity to ask questions and all were answered. The patient agreed with the plan and demonstrated an understanding of the instructions.   The patient was advised to call back or seek an in-person evaluation if the symptoms worsen or if the condition fails to improve as anticipated.  I provided 37 minutes of non-face-to-face time during this encounter.  The patient was located at home.  The provider was located at home.   Corie Chiquito, PMHNP   Subjective:   Patient ID:  Breanna Young is a 42 y.o. (DOB 11/14/1980) female.  Chief Complaint:  Chief Complaint  Patient presents with   Anxiety    HPI Breanna Young presents for follow-up of anxiety, insomnia, and depression. She reports, "anxiety has not been good." She reports some stressors with mother. She reports that "things at home are a little better." She reports that her work has been slow and this has caused stress and financial worry. She reports difficulty falling asleep. She reports that she is tossing and turning throughout the night. She wakes up with anxiety and jitters, and not feeling rested. She does not recall nightmares. She reports that she has been having "strange dreams." She reports that she feels "overwhelmed" when she is around large groups of people.   Denies depression. She reports some situational sadness. She reports that when she experiences anger, "I just push it down" and "feel kind of  numb." She reports that her motivation is ok "when I get out of the house." She reports that her energy also improves when she gets out. Appetite "goes back and forth." She reports poor concentration and focus. She reports that she is sometimes asking the same question twice and not immediately remembering that she already asked a question. Denies losing or misplacing things. She sometimes forgets what she was about to do. She reports that her concentration is worse when she is more anxious. Denies SI.   "I think my medication is doing exactly what it supposed to do."   She has not started therapy yet.   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 She reports that she has started to dislike her job.   Klonopin last filled 07/09/22.  Past Psychiatric Medication Trials: Viibryd- More effective at 40 mg dose and then had more "fogginess" and some sexual side effective. Not effective at 20 mg.  Trintellix- Helpful for depression Zoloft-sexual side effects. Felt more "upbeat."  Lexapro-adverse reaction Wellbutrin XL-increased anxiety and felt fuzzy headed Lamictal- helpful for mood and anxiety. Rexulti-affective dulling, weight gain Trazodone Belsomra-Groggy, blurred vision Ativan Klonopin Xanax- Sleepy Doxazosin-dissociation BuSpar-adverse reaction BC Powder- Ineffective Excedrin Migraine Tizanidine- ineffective for migraines Propranolol Ubrelvy Topamax   Review of Systems:  Review of Systems  Gastrointestinal:        She reports that she continues to experience frequent GI symptoms.   Musculoskeletal:  Negative for gait problem.  Psychiatric/Behavioral:         Please refer to HPI  Medications: I have reviewed the patient's current medications.  Current Outpatient Medications  Medication Sig Dispense Refill   Ascorbic Acid (VITAMIN C PO) Take by mouth.     calcium citrate (CALCITRATE - DOSED IN MG ELEMENTAL CALCIUM) 950 (200 Ca) MG tablet Take 200 mg of  elemental calcium by mouth daily.     Cholecalciferol (VITAMIN D3) 125 MCG (5000 UT) CAPS Take by mouth.     clonazePAM (KLONOPIN) 1 MG tablet TAKE 1 TABLET BY MOUTH TWICE A DAY THEN 1 TAB AS NEEDED FOR ANXIETY 90 tablet 5   Collagenase POWD Take 1 Scoop by mouth daily in the afternoon.     cyclobenzaprine (FLEXERIL) 10 MG tablet Take 1 tablet (10 mg total) by mouth 3 (three) times daily as needed for muscle spasms. 30 tablet 0   docusate sodium (COLACE) 100 MG capsule Take 100 mg by mouth daily as needed for mild constipation.     ferrous sulfate 325 (65 FE) MG tablet Take 325 mg by mouth every other day.     MAGNESIUM CITRATE PO Take by mouth.     Misc Natural Products (SUPER GREENS) POWD Take 1 Scoop by mouth daily in the afternoon.     Multiple Vitamin (MULTIVITAMIN ADULT) TABS Take 1 tablet by mouth 3 (three) times a week.     Omega-3 Fatty Acids (FISH OIL) 1000 MG CAPS Take by mouth.     Probiotic Product (PROBIOTIC PO) Take 1 capsule by mouth daily.     promethazine (PHENERGAN) 25 MG tablet Take 1 tablet (25 mg total) by mouth every 6 (six) hours as needed for nausea or vomiting. 30 tablet 0   traZODone (DESYREL) 100 MG tablet Take 1 tablet (100 mg total) by mouth at bedtime. 90 tablet 1   ibuprofen (ADVIL) 600 MG tablet Take 1 tablet (600 mg total) by mouth every 6 (six) hours as needed. (Patient not taking: Reported on 08/30/2022) 30 tablet 0   lamoTRIgine (LAMICTAL) 100 MG tablet TAKE 1 to 1.5 TABS BY MOUTH EVERY DAY 135 tablet 1   levonorgestrel (MIRENA) 20 MCG/24HR IUD 1 each by Intrauterine route once.      ondansetron (ZOFRAN) 4 MG tablet Take 4 mg by mouth every 8 (eight) hours as needed for nausea or vomiting.     SUMAtriptan (IMITREX) 50 MG tablet TAKE 1 TAB EVERY 2 HRS AS NEEDED FOR MIGRAINE. MAY REPEAT IN 2 HOURS IF HEADACHE PERSISTS OR RECURS. 8 tablet 1   Vilazodone HCl 20 MG TABS TAKE 1 AND 1/2 TABLETS DAILY BY MOUTH 135 tablet 1   No current facility-administered  medications for this visit.    Medication Side Effects: None  Allergies:  Allergies  Allergen Reactions   Aluminum-Containing Compounds     Rash/itchy   Avelox [Moxifloxacin Hcl In Nacl] Other (See Comments)    Unknown reaction type (possible rash reaction)    Dexlansoprazole Other (See Comments)    Unknown reaction type (possible rash reaction)   Doxazosin Other (See Comments)    Dizziness/faint   Reglan [Metoclopramide]     Hallucinations and a contraindication to her viibyrd    Past Medical History:  Diagnosis Date   Anxiety    Depression    GERD (gastroesophageal reflux disease)    Hiatal hernia    IBS (irritable bowel syndrome)     Family History  Problem Relation Age of Onset   Depression Mother    Diabetes Mother    GER disease Mother  said she had it for a long time   Hyperlipidemia Mother    Hypertension Mother    Arthritis Mother    Heart attack Father    Diabetes Father    Depression Maternal Grandmother    Endometrial cancer Maternal Grandmother    Arthritis Maternal Grandmother    Hypertension Maternal Grandmother    Rheum arthritis Maternal Grandmother    Dementia Maternal Grandfather    Diabetes Maternal Grandfather    Stroke Maternal Grandfather    Dementia Paternal Grandmother    Arthritis Paternal Grandmother    Heart disease Paternal Grandfather    Colon cancer Neg Hx    Breast cancer Neg Hx    Esophageal cancer Neg Hx    Stomach cancer Neg Hx    Rectal cancer Neg Hx     Social History   Socioeconomic History   Marital status: Single    Spouse name: Not on file   Number of children: Not on file   Years of education: Not on file   Highest education level: Not on file  Occupational History   Occupation: hair stylist  Tobacco Use   Smoking status: Never   Smokeless tobacco: Never  Vaping Use   Vaping status: Never Used  Substance and Sexual Activity   Alcohol use: Not Currently   Drug use: Never   Sexual activity: Yes     Partners: Male    Birth control/protection: I.U.D.  Other Topics Concern   Not on file  Social History Narrative   Lives with boyfriend   Hairstylist   Social Determinants of Health   Financial Resource Strain: Not on file  Food Insecurity: Not on file  Transportation Needs: Not on file  Physical Activity: Sufficiently Active (10/17/2017)   Exercise Vital Sign    Days of Exercise per Week: 4 days    Minutes of Exercise per Session: 60 min  Stress: Not on file  Social Connections: Not on file  Intimate Partner Violence: Not on file    Past Medical History, Surgical history, Social history, and Family history were reviewed and updated as appropriate.   Please see review of systems for further details on the patient's review from today.   Objective:   Physical Exam:  There were no vitals taken for this visit.  Physical Exam Neurological:     Mental Status: She is alert and oriented to person, place, and time.     Cranial Nerves: No dysarthria.  Psychiatric:        Attention and Perception: Attention and perception normal.        Mood and Affect: Mood is anxious. Mood is not depressed.        Speech: Speech normal.        Behavior: Behavior is cooperative.        Thought Content: Thought content normal. Thought content is not paranoid or delusional. Thought content does not include homicidal or suicidal ideation. Thought content does not include homicidal or suicidal plan.        Cognition and Memory: Cognition and memory normal.        Judgment: Judgment normal.     Comments: Insight intact     Lab Review:     Component Value Date/Time   NA 138 09/21/2021 1617   K 3.4 (L) 09/21/2021 1617   CL 105 09/21/2021 1617   CO2 23 09/21/2021 1617   GLUCOSE 98 09/21/2021 1617   BUN 10 09/21/2021 1617   CREATININE 0.90 09/21/2021 1617  CREATININE 0.86 11/05/2019 1435   CALCIUM 9.9 09/21/2021 1617   PROT 6.9 09/21/2021 1617   ALBUMIN 4.3 09/21/2021 1617   AST 13  09/21/2021 1617   ALT 12 09/21/2021 1617   ALKPHOS 63 09/21/2021 1617   BILITOT 0.4 09/21/2021 1617   GFRNONAA >60 12/08/2019 1422       Component Value Date/Time   WBC 9.5 12/21/2021 1514   RBC 4.73 12/21/2021 1514   HGB 14.9 12/21/2021 1514   HCT 43.6 12/21/2021 1514   PLT 189.0 12/21/2021 1514   MCV 92.2 12/21/2021 1514   MCH 32.4 12/08/2019 1422   MCHC 34.3 12/21/2021 1514   RDW 13.7 12/21/2021 1514   LYMPHSABS 2.3 12/21/2021 1514   MONOABS 0.5 12/21/2021 1514   EOSABS 0.2 12/21/2021 1514   BASOSABS 0.1 12/21/2021 1514    No results found for: "POCLITH", "LITHIUM"   No results found for: "PHENYTOIN", "PHENOBARB", "VALPROATE", "CBMZ"   .res Assessment: Plan:   37 minutes spent dedicated to the care of this patient on the date of this encounter to include pre-visit review of records, ordering of medication, post visit documentation, and face-to-face time with the patient discussing therapy referrals since she reports that the therapist she was considering does not accept insurance. Provided patient with contact information for [facility]. Pt reports that she would like to continue current medications without changes at this time.  Will continue Viibryd 30 mg daily for depression and anxiety.  Continue Lamictal 100 mg daily for mood symptoms. Continue Klonopin 1 mg po BID and 1 tab as needed for anxiety.  Continue Trazodone 100 mg po at bedtime for insomnia.  Pt to follow-up in 3 months or sooner if clinically indicated.  Patient advised to contact office with any questions, adverse effects, or acute worsening in signs and symptoms.   Breanna Young was seen today for anxiety.  Diagnoses and all orders for this visit:  Recurrent major depressive disorder, in partial remission (HCC) -     lamoTRIgine (LAMICTAL) 100 MG tablet; TAKE 1 to 1.5 TABS BY MOUTH EVERY DAY -     Vilazodone HCl 20 MG TABS; TAKE 1 AND 1/2 TABLETS DAILY BY MOUTH  Insomnia due to mental  disorder  Generalized anxiety disorder -     Vilazodone HCl 20 MG TABS; TAKE 1 AND 1/2 TABLETS DAILY BY MOUTH  PTSD (post-traumatic stress disorder) -     Vilazodone HCl 20 MG TABS; TAKE 1 AND 1/2 TABLETS DAILY BY MOUTH     Please see After Visit Summary for patient specific instructions.  No future appointments.  No orders of the defined types were placed in this encounter.     -------------------------------

## 2022-09-13 ENCOUNTER — Other Ambulatory Visit: Payer: Self-pay | Admitting: Psychiatry

## 2022-09-13 DIAGNOSIS — F5105 Insomnia due to other mental disorder: Secondary | ICD-10-CM

## 2022-10-18 DIAGNOSIS — Z1231 Encounter for screening mammogram for malignant neoplasm of breast: Secondary | ICD-10-CM | POA: Diagnosis not present

## 2022-10-18 LAB — HM MAMMOGRAPHY

## 2022-10-25 DIAGNOSIS — F4312 Post-traumatic stress disorder, chronic: Secondary | ICD-10-CM | POA: Diagnosis not present

## 2022-11-08 DIAGNOSIS — F4312 Post-traumatic stress disorder, chronic: Secondary | ICD-10-CM | POA: Diagnosis not present

## 2022-11-08 NOTE — Progress Notes (Signed)
Subjective:    Breanna Young is a 42 y.o. female and is here for a comprehensive physical exam.  HPI  There are no preventive care reminders to display for this patient.   Acute Concerns: None  Chronic Issues: Anxiety and Depression Treated with clonazepam 1 mg twice daily, trazodone 100 mg at bedtime, lamotrigine 100-150 mg daily. Followed by NP Corie Chiquito. She has restarted therapy recently and this is helping. Denies SI/HI.  Rash Right Forearm Notes flare-up of rash on right forearm after scratching it last night. She is using hydrocortisone cream with some improvement Denies fevers/chills  GERD Stable with esomeprazole 40 mg twice daily.  Family history of Heart Disease Discussed CT coronary calcium scoring.  Iron Deficiency Taking ferrous sulfate 325 mg every other day. Requesting iron check. Menses have ceased with Mirena IUD in-place. Denies rectal bleeding.  Labs pending. Does not see a dermatologist regularly but has scheduled an appointment for 12/2022. Denies leg swelling.  Health Maintenance: Immunizations -- UTD on tetanus vaccine. Colonoscopy -- N/A Mammogram -- Reported normal on 10/12/21. PAP -- Normal on 06/23/22. Bone Density -- N/A Diet -- Mostly healthy. Exercise -- Walks three times weekly.  Sleep habits -- Stable Mood -- Stable  UTD with dentist? - Yes UTD with eye doctor? - Not UTD, no concerns.  Weight history: Wt Readings from Last 10 Encounters:  11/15/22 113 lb 8 oz (51.5 kg)  02/22/22 119 lb (54 kg)  12/28/21 125 lb (56.7 kg)  11/30/21 125 lb (56.7 kg)  10/26/21 128 lb (58.1 kg)  10/05/21 129 lb (58.5 kg)  09/21/21 127 lb (57.6 kg)  12/08/19 136 lb (61.7 kg)  11/14/19 136 lb (61.7 kg)  11/05/19 136 lb 8 oz (61.9 kg)   Body mass index is 18.89 kg/m. No LMP recorded. (Menstrual status: IUD).  Alcohol use:  reports that she does not currently use alcohol.  Tobacco use:  Tobacco Use: Low Risk  (11/15/2022)    Patient History    Smoking Tobacco Use: Never    Smokeless Tobacco Use: Never    Passive Exposure: Not on file   Eligible for lung cancer screening? No     11/15/2022   10:21 AM  Depression screen PHQ 2/9  Decreased Interest 1  Down, Depressed, Hopeless 1  PHQ - 2 Score 2  Altered sleeping 2  Tired, decreased energy 1  Change in appetite 1  Feeling bad or failure about yourself  1  Trouble concentrating 2  Moving slowly or fidgety/restless 0  Suicidal thoughts 0  PHQ-9 Score 9  Difficult doing work/chores Somewhat difficult     Other providers/specialists: Patient Care Team: Jarold Motto, [place] as PCP - General (Physician Assistant)    PMHx, SurgHx, SocialHx, Medications, and Allergies were reviewed in the Visit Navigator and updated as appropriate.   Past Medical History:  Diagnosis Date   Anxiety    Depression    GERD (gastroesophageal reflux disease)    Hiatal hernia    IBS (irritable bowel syndrome)      Past Surgical History:  Procedure Laterality Date   ESOPHAGEAL DILATION  03/26/2019   Procedure: ESOPHAGEAL DILATION;  Surgeon: Shellia Cleverly, DO;  Location: [facility];  Service: Gastroenterology;;   ESOPHAGOGASTRODUODENOSCOPY (EGD) WITH PROPOFOL N/A 03/26/2019   Procedure: ESOPHAGOGASTRODUODENOSCOPY (EGD) WITH PROPOFOL;  Surgeon: Shellia Cleverly, DO;  Location: [facility];  Service: Gastroenterology;  Laterality: N/A;  Food Bolus in stomach removed   TONSILLECTOMY     TRANSORAL INCISIONLESS FUNDOPLICATION  N/A 03/26/2019   Procedure: TRANSORAL INCISIONLESS FUNDOPLICATION;  Surgeon: Shellia Cleverly, DO;  Location: [facility];  Service: Gastroenterology;  Laterality: N/A;     Family History  Problem Relation Age of Onset   Depression Mother    Diabetes Mother    GER disease Mother        said she had it for a long time   Hyperlipidemia Mother    Hypertension Mother    Arthritis Mother    Heart attack Father 48   Diabetes Father     Depression Maternal Grandmother    Endometrial cancer Maternal Grandmother    Arthritis Maternal Grandmother    Hypertension Maternal Grandmother    Rheum arthritis Maternal Grandmother    Dementia Maternal Grandfather    Diabetes Maternal Grandfather    Stroke Maternal Grandfather    Dementia Paternal Grandmother    Arthritis Paternal Grandmother    Heart disease Paternal Grandfather    Cancer Maternal Great-grandmother        gynecological cancer   Colon cancer Neg Hx    Breast cancer Neg Hx    Esophageal cancer Neg Hx    Stomach cancer Neg Hx    Rectal cancer Neg Hx     Social History   Tobacco Use   Smoking status: Never   Smokeless tobacco: Never  Vaping Use   Vaping status: Never Used  Substance Use Topics   Alcohol use: Not Currently   Drug use: Never    Review of Systems:   Review of Systems  Constitutional:  Negative for chills, fever, malaise/fatigue and weight loss.  HENT:  Negative for hearing loss, sinus pain and sore throat.   Respiratory:  Negative for cough, hemoptysis and shortness of breath.   Cardiovascular:  Negative for chest pain, palpitations, leg swelling and PND.  Gastrointestinal:  Negative for abdominal pain, blood in stool, constipation, diarrhea, heartburn, nausea and vomiting.  Genitourinary:  Negative for dysuria, frequency and urgency.  Musculoskeletal:  Negative for back pain, joint pain, myalgias and neck pain.  Skin:  Positive for rash (Right forearm). Negative for itching.  Neurological:  Negative for dizziness, tingling, seizures and headaches.  Endo/Heme/Allergies:  Negative for polydipsia.  Psychiatric/Behavioral:  Negative for depression. The patient is not nervous/anxious.     Objective:   BP 100/60 (BP Location: Left Arm, Patient Position: Sitting, Cuff Size: Normal)   Pulse 88   Temp 97.8 F (36.6 C) (Temporal)   Ht 5\' 5"  (1.651 m)   Wt 113 lb 8 oz (51.5 kg)   SpO2 100%   BMI 18.89 kg/m  Body mass index is 18.89  kg/m.   General Appearance:    Alert, cooperative, no distress, appears stated age  Head:    Normocephalic, without obvious abnormality, atraumatic  Eyes:    PERRL, conjunctiva/corneas clear, EOM's intact, fundi    benign, both eyes  Ears:    Normal TM's and external ear canals, both ears  Nose:   Nares normal, septum midline, mucosa normal, no drainage    or sinus tenderness  Throat:   Lips, mucosa, and tongue normal; teeth and gums normal  Neck:   Supple, symmetrical, trachea midline, no adenopathy;    thyroid:  no enlargement/tenderness/nodules; no carotid   bruit or JVD  Back:     Symmetric, no curvature, ROM normal, no CVA tenderness  Lungs:     Clear to auscultation bilaterally, respirations unlabored  Chest Wall:    No tenderness or deformity  Heart:    Regular rate and rhythm, S1 and S2 normal, no murmur, rub or gallop  Breast Exam:    Deferred  Abdomen:     Soft, non-tender, bowel sounds active all four quadrants,    no masses, no organomegaly  Genitalia:    Deferred   Extremities:   Extremities normal, atraumatic, no cyanosis or edema  Pulses:   2+ and symmetric all extremities  Skin:   Skin color, texture, turgor normal Right lateral aspect of forearm with multiple scattered erythematous papules  Lymph nodes:   Cervical, supraclavicular, and axillary nodes normal  Neurologic:   CNII-XII intact, normal strength, sensation and reflexes    throughout    Assessment/Plan:   Routine physical examination Today patient counseled on age appropriate routine health concerns for screening and prevention, each reviewed and up to date or declined. Immunizations reviewed and up to date or declined. Labs ordered and reviewed. Risk factors for depression reviewed and negative. Hearing function and visual acuity are intact. ADLs screened and addressed as needed. Functional ability and level of safety reviewed and appropriate. Education, counseling and referrals performed based on assessed  risks today. Patient provided with a copy of personalized plan for preventive services.  Severe recurrent major depression without psychotic features (HCC) Overall stable per patient Management per psychiatry and talk therapy Denies suicidal ideation/shi  Iron deficiency anemia, unspecified iron deficiency anemia type Update blood work today and provide recommendations  Family history of heart disease Update lipid panel Discussed calcium score - she will consider, handout provided  Rash Suspect contact dermatitis Continue hydrocortisone cream and consider antihistamine Follow up if doesn't continue to improve  Gastroesophageal reflux disease, unspecified whether esophagitis present Overall stable with nexium  I,Alexander Ruley,acting as a scribe for [employer], PA.,have documented all relevant documentation on the behalf of Jarold Motto, PA,as directed by  Jarold Motto, PA while in the presence of Jarold Motto, [place].  I, Jarold Motto, [place], have reviewed all documentation for this visit. The documentation on 11/15/22 for the exam, diagnosis, procedures, and orders are all accurate and complete.  Jarold Motto, PA-C [facility]

## 2022-11-15 ENCOUNTER — Ambulatory Visit (INDEPENDENT_AMBULATORY_CARE_PROVIDER_SITE_OTHER): Payer: 59 | Admitting: Physician Assistant

## 2022-11-15 ENCOUNTER — Encounter: Payer: Self-pay | Admitting: Physician Assistant

## 2022-11-15 VITALS — BP 100/60 | HR 88 | Temp 97.8°F | Ht 65.0 in | Wt 113.5 lb

## 2022-11-15 DIAGNOSIS — F332 Major depressive disorder, recurrent severe without psychotic features: Secondary | ICD-10-CM

## 2022-11-15 DIAGNOSIS — K219 Gastro-esophageal reflux disease without esophagitis: Secondary | ICD-10-CM | POA: Diagnosis not present

## 2022-11-15 DIAGNOSIS — Z0000 Encounter for general adult medical examination without abnormal findings: Secondary | ICD-10-CM

## 2022-11-15 DIAGNOSIS — Z8249 Family history of ischemic heart disease and other diseases of the circulatory system: Secondary | ICD-10-CM

## 2022-11-15 DIAGNOSIS — D509 Iron deficiency anemia, unspecified: Secondary | ICD-10-CM

## 2022-11-15 DIAGNOSIS — R21 Rash and other nonspecific skin eruption: Secondary | ICD-10-CM | POA: Diagnosis not present

## 2022-11-15 LAB — COMPREHENSIVE METABOLIC PANEL
ALT: 12 U/L (ref 0–35)
AST: 15 U/L (ref 0–37)
Albumin: 4.3 g/dL (ref 3.5–5.2)
Alkaline Phosphatase: 62 U/L (ref 39–117)
BUN: 11 mg/dL (ref 6–23)
CO2: 31 meq/L (ref 19–32)
Calcium: 9.6 mg/dL (ref 8.4–10.5)
Chloride: 104 meq/L (ref 96–112)
Creatinine, Ser: 0.78 mg/dL (ref 0.40–1.20)
GFR: 93.05 mL/min (ref >60.00)
Glucose, Bld: 77 mg/dL (ref 70–99)
Potassium: 3.4 meq/L — ABNORMAL LOW (ref 3.5–5.1)
Sodium: 141 meq/L (ref 135–145)
Total Bilirubin: 0.3 mg/dL (ref 0.2–1.2)
Total Protein: 6.6 g/dL (ref 6.0–8.3)

## 2022-11-15 LAB — CBC WITH DIFFERENTIAL/PLATELET
Basophils Absolute: 0.1 10*3/uL (ref 0.0–0.1)
Basophils Relative: 0.9 % (ref 0.0–3.0)
Eosinophils Absolute: 0.3 10*3/uL (ref 0.0–0.7)
Eosinophils Relative: 4.9 % (ref 0.0–5.0)
HCT: 42.9 % (ref 36.0–46.0)
Hemoglobin: 14 g/dL (ref 12.0–15.0)
Lymphocytes Relative: 31.4 % (ref 12.0–46.0)
Lymphs Abs: 2 10*3/uL (ref 0.7–4.0)
MCHC: 32.6 g/dL (ref 30.0–36.0)
MCV: 93.7 fL (ref 78.0–100.0)
Monocytes Absolute: 0.3 10*3/uL (ref 0.1–1.0)
Monocytes Relative: 4.5 % (ref 3.0–12.0)
Neutro Abs: 3.6 10*3/uL (ref 1.4–7.7)
Neutrophils Relative %: 58.3 % (ref 43.0–77.0)
Platelets: 200 10*3/uL (ref 150.0–400.0)
RBC: 4.58 Mil/uL (ref 3.87–5.11)
RDW: 13.2 % (ref 11.5–15.5)
WBC: 6.2 10*3/uL (ref 4.0–10.5)

## 2022-11-15 LAB — LIPID PANEL
Cholesterol: 158 mg/dL (ref 0–200)
HDL: 53 mg/dL (ref >39.00)
LDL Cholesterol: 95 mg/dL (ref 0–99)
NonHDL: 105.03
Total CHOL/HDL Ratio: 3
Triglycerides: 48 mg/dL (ref 0.0–149.0)
VLDL: 9.6 mg/dL (ref 0.0–40.0)

## 2022-11-15 LAB — IBC + FERRITIN
Ferritin: 60.9 ng/mL (ref 10.0–291.0)
Iron: 65 ug/dL (ref 42–145)
Saturation Ratios: 24.1 % (ref 20.0–50.0)
TIBC: 270.2 ug/dL (ref 250.0–450.0)
Transferrin: 193 mg/dL — ABNORMAL LOW (ref 212.0–360.0)

## 2022-11-15 NOTE — Patient Instructions (Addendum)
It was great to see you!  Consider daily antihistamine to help with your rash and ears Look over the calcium score information attached -- if interested, we can order for you. This is $99 out of pocket with [facility] currently.  Please go to the lab for blood work.   Our office will call you with your results unless you have chosen to receive results via MyChart.  If your blood work is normal we will follow-up each year for physicals and as scheduled for chronic medical problems.  If anything is abnormal we will treat accordingly and get you in for a follow-up.  Take care,  Lelon Mast

## 2022-11-22 ENCOUNTER — Ambulatory Visit: Payer: 59 | Admitting: Psychiatry

## 2022-11-22 DIAGNOSIS — F4312 Post-traumatic stress disorder, chronic: Secondary | ICD-10-CM | POA: Diagnosis not present

## 2022-11-27 ENCOUNTER — Encounter: Payer: Self-pay | Admitting: Psychiatry

## 2022-11-27 ENCOUNTER — Telehealth (INDEPENDENT_AMBULATORY_CARE_PROVIDER_SITE_OTHER): Payer: 59 | Admitting: Psychiatry

## 2022-11-27 DIAGNOSIS — F5105 Insomnia due to other mental disorder: Secondary | ICD-10-CM | POA: Diagnosis not present

## 2022-11-27 DIAGNOSIS — F4310 Post-traumatic stress disorder, unspecified: Secondary | ICD-10-CM | POA: Diagnosis not present

## 2022-11-27 DIAGNOSIS — F411 Generalized anxiety disorder: Secondary | ICD-10-CM

## 2022-11-27 DIAGNOSIS — F3341 Major depressive disorder, recurrent, in partial remission: Secondary | ICD-10-CM | POA: Diagnosis not present

## 2022-11-27 MED ORDER — VILAZODONE HCL 20 MG PO TABS: ORAL_TABLET | ORAL | 1 refills | Status: AC

## 2022-11-27 MED ORDER — CLONAZEPAM 1 MG PO TABS
1.0000 mg | ORAL_TABLET | Freq: Three times a day (TID) | ORAL | 5 refills | Status: AC | PRN
Start: 2022-12-11 — End: ?

## 2022-11-27 MED ORDER — LAMOTRIGINE 100 MG PO TABS
ORAL_TABLET | ORAL | 1 refills | Status: AC
Start: 2022-11-27 — End: ?

## 2022-11-27 MED ORDER — TRAZODONE HCL 100 MG PO TABS: 100.0000 mg | ORAL_TABLET | Freq: Every evening | ORAL | 1 refills | Status: DC | PRN

## 2022-11-27 NOTE — Progress Notes (Signed)
Breanna Young [id number] [birth year] 42 y.o.  Virtual Visit via Video Note  I connected with pt @ on 11/27/22 at  2:00 PM EST by a video enabled telemedicine application and verified that I am speaking with the correct person using two identifiers.   I discussed the limitations of evaluation and management by telemedicine and the availability of in person appointments. The patient expressed understanding and agreed to proceed.  I discussed the assessment and treatment plan with the patient. The patient was provided an opportunity to ask questions and all were answered. The patient agreed with the plan and demonstrated an understanding of the instructions.   The patient was advised to call back or seek an in-person evaluation if the symptoms worsen or if the condition fails to improve as anticipated.  I provided 28 minutes of non-face-to-face time during this encounter.  The patient was located at home.  The provider was located at home.   Corie Chiquito, PMHNP   Subjective:   Patient ID:  Breanna Young is a 42 y.o. (DOB August 15, 1980) female.  Chief Complaint:  Chief Complaint  Patient presents with   Follow-up    Anxiety, depression, and insomnia    HPI Breanna Young presents for follow-up of anxiety, depression, and insomnia. She reports that she has started therapy with a new therapist and this has been helpful for her. She reports that her anxiety is "still not great." She reports that she takes Klonopin every morning and often needs to take an additional Klonopin prn. She reports that she will feel "pressured" to complete things and this causes anxiety. She reports some anticipatory anxiety prior to appointments and things that she needs to do, such as appointments. She reports that she is having panic attacks 1-2 times a week. She reports that depression has been "totally fine." She reports that she has been experiencing some irritability. She reports feeling "numb" until emotions  rise to the point of irritability. She reports that occasionally it is "one thing" that will upset her. She reports occasionally becoming verbally agitated. Reports that she is not becoming agitated at work or physically agitated. She reports that her energy and motivation have been good. She reports that she is being more physically active with part-time job. She reports that having more responsibilities has been helpful for structuring her day and staying motivated. She reports improved concentration. Notices increased difficulty with concentration when she has more idle time. She reports that her sleep is often restless and may have an active mind. Sometimes does not feel rested upon awakening. Sleeping 7-8 hours a night if possible. Appetite has been "here and there, comes and goes." She reports diminished appetite with stress. Denies SI.   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 She is taking care of [employer].   She has had some family stressors in the last few months.   Klonopin last filled 11/13/22.   Past Psychiatric Medication Trials: Viibryd- More effective at 40 mg dose and then had more "fogginess" and some sexual side effective. Not effective at 20 mg.  Trintellix- Helpful for depression Zoloft-sexual side effects. Felt more "upbeat."  Lexapro-adverse reaction Wellbutrin XL-increased anxiety and felt fuzzy headed Lamictal- helpful for mood and anxiety. Rexulti-affective dulling, weight gain Trazodone- Had some excessive grogginess with higher doses.  Belsomra-Groggy, blurred vision Ativan Klonopin Xanax- Sleepy Doxazosin-dissociation BuSpar-adverse reaction BC Powder- Ineffective Excedrin Migraine Tizanidine- ineffective for migraines Propranolol Ubrelvy Topamax  Review of Systems:  Review of Systems  Gastrointestinal:        Occ GI symptoms after eating certain foods  Musculoskeletal:   Negative for gait problem.  Psychiatric/Behavioral:         Please refer to HPI    Medications: I have reviewed the patient's current medications.  Current Outpatient Medications  Medication Sig Dispense Refill   MAGNESIUM CITRATE PO Take by mouth.     Ascorbic Acid (VITAMIN C PO) Take 500 mg by mouth every other day.     calcium citrate (CALCITRATE - DOSED IN MG ELEMENTAL CALCIUM) 950 (200 Ca) MG tablet Take 200 mg of elemental calcium by mouth daily.     Cholecalciferol (VITAMIN D3) 125 MCG (5000 UT) CAPS Take by mouth.     [START ON 12/11/2022] clonazePAM (KLONOPIN) 1 MG tablet Take 1 tablet (1 mg total) by mouth 3 (three) times daily as needed. TAKE 1 TABLET BY MOUTH TWICE A DAY THEN 1 TAB AS NEEDED FOR ANXIETY 90 tablet 5   Collagenase POWD Take 1 Scoop by mouth daily in the afternoon.     cyclobenzaprine (FLEXERIL) 10 MG tablet Take 1 tablet (10 mg total) by mouth 3 (three) times daily as needed for muscle spasms. 30 tablet 0   docusate sodium (COLACE) 100 MG capsule Take 100 mg by mouth daily as needed for mild constipation.     esomeprazole (NEXIUM) 40 MG capsule Take 1 capsule by mouth 2 (two) times daily.     ferrous sulfate 325 (65 FE) MG tablet Take 325 mg by mouth every other day.     ibuprofen (ADVIL) 600 MG tablet Take 1 tablet (600 mg total) by mouth every 6 (six) hours as needed. 30 tablet 0   lamoTRIgine (LAMICTAL) 100 MG tablet TAKE 1 to 1.5 TABS BY MOUTH EVERY DAY 135 tablet 1   levonorgestrel (MIRENA) 20 MCG/24HR IUD 1 each by Intrauterine route once.      Misc Natural Products (SUPER GREENS) POWD Take 1 Scoop by mouth daily in the afternoon.     Multiple Vitamin (MULTIVITAMIN ADULT) TABS Take 1 tablet by mouth daily in the afternoon.     Omega-3 Fatty Acids (FISH OIL) 1000 MG CAPS Take by mouth.     ondansetron (ZOFRAN) 4 MG tablet Take 4 mg by mouth every 8 (eight) hours as needed for nausea or vomiting.     Probiotic Product (PROBIOTIC PO) Take 1 capsule by mouth  daily.     promethazine (PHENERGAN) 25 MG tablet Take 1 tablet (25 mg total) by mouth every 6 (six) hours as needed for nausea or vomiting. 30 tablet 0   SUMAtriptan (IMITREX) 50 MG tablet TAKE 1 TAB EVERY 2 HRS AS NEEDED FOR MIGRAINE. MAY REPEAT IN 2 HOURS IF HEADACHE PERSISTS OR RECURS. 8 tablet 1   traZODone (DESYREL) 100 MG tablet Take 1 tablet (100 mg total) by mouth at bedtime as needed for sleep. 90 tablet 1   Vilazodone HCl 20 MG TABS TAKE 1 AND 1/2 TABLETS DAILY BY MOUTH 135 tablet 1   No current facility-administered medications for this visit.    Medication Side Effects: None  Allergies:  Allergies  Allergen Reactions   Aluminum-Containing Compounds     Rash/itchy   Avelox [Moxifloxacin Hcl In Nacl] Other (See Comments)    Unknown reaction type (possible rash reaction)    Dexlansoprazole Other (See Comments)    Unknown reaction type (possible rash reaction)   Doxazosin Other (See Comments)  Dizziness/faint   Reglan [Metoclopramide]     Hallucinations and a contraindication to her viibyrd    Past Medical History:  Diagnosis Date   Anxiety    Depression    GERD (gastroesophageal reflux disease)    Hiatal hernia    IBS (irritable bowel syndrome)     Family History  Problem Relation Age of Onset   Depression Mother    Diabetes Mother    GER disease Mother        said she had it for a long time   Hyperlipidemia Mother    Hypertension Mother    Arthritis Mother    Heart attack Father 54   Diabetes Father    Depression Maternal Grandmother    Endometrial cancer Maternal Grandmother    Arthritis Maternal Grandmother    Hypertension Maternal Grandmother    Rheum arthritis Maternal Grandmother    Dementia Maternal Grandfather    Diabetes Maternal Grandfather    Stroke Maternal Grandfather    Dementia Paternal Grandmother    Arthritis Paternal Grandmother    Heart disease Paternal Grandfather    Cancer Maternal Great-grandmother        gynecological cancer    Colon cancer Neg Hx    Breast cancer Neg Hx    Esophageal cancer Neg Hx    Stomach cancer Neg Hx    Rectal cancer Neg Hx     Social History   Socioeconomic History   Marital status: Single    Spouse name: Not on file   Number of children: Not on file   Years of education: Not on file   Highest education level: Not on file  Occupational History   Occupation: hair stylist  Tobacco Use   Smoking status: Never   Smokeless tobacco: Never  Vaping Use   Vaping status: Never Used  Substance and Sexual Activity   Alcohol use: Not Currently   Drug use: Never   Sexual activity: Yes    Partners: Male    Birth control/protection: I.U.D.  Other Topics Concern   Not on file  Social History Narrative   Lives with boyfriend   Hairstylist   Social Determinants of Health   Financial Resource Strain: Not on file  Food Insecurity: Not on file  Transportation Needs: Not on file  Physical Activity: Sufficiently Active (10/17/2017)   Exercise Vital Sign    Days of Exercise per Week: 4 days    Minutes of Exercise per Session: 60 min  Stress: Not on file  Social Connections: Not on file  Intimate Partner Violence: Not on file    Past Medical History, Surgical history, Social history, and Family history were reviewed and updated as appropriate.   Please see review of systems for further details on the patient's review from today.   Objective:   Physical Exam:  There were no vitals taken for this visit.  Physical Exam Neurological:     Mental Status: She is alert and oriented to person, place, and time.     Cranial Nerves: No dysarthria.  Psychiatric:        Attention and Perception: Attention and perception normal.        Speech: Speech normal.        Behavior: Behavior is cooperative.        Thought Content: Thought content normal. Thought content is not paranoid or delusional. Thought content does not include homicidal or suicidal ideation. Thought content does not include  homicidal or suicidal plan.  Cognition and Memory: Cognition and memory normal.        Judgment: Judgment normal.     Comments: Insight intact Mood is mildly anxious in response to family stressors. Mood presents as more hopeful compared to recent exams     Lab Review:     Component Value Date/Time   NA 141 11/15/2022 1104   K 3.4 (L) 11/15/2022 1104   CL 104 11/15/2022 1104   CO2 31 11/15/2022 1104   GLUCOSE 77 11/15/2022 1104   BUN 11 11/15/2022 1104   CREATININE 0.78 11/15/2022 1104   CREATININE 0.86 11/05/2019 1435   CALCIUM 9.6 11/15/2022 1104   PROT 6.6 11/15/2022 1104   ALBUMIN 4.3 11/15/2022 1104   AST 15 11/15/2022 1104   ALT 12 11/15/2022 1104   ALKPHOS 62 11/15/2022 1104   BILITOT 0.3 11/15/2022 1104   GFRNONAA >60 12/08/2019 1422       Component Value Date/Time   WBC 6.2 11/15/2022 1104   RBC 4.58 11/15/2022 1104   HGB 14.0 11/15/2022 1104   HCT 42.9 11/15/2022 1104   PLT 200.0 11/15/2022 1104   MCV 93.7 11/15/2022 1104   MCH 32.4 12/08/2019 1422   MCHC 32.6 11/15/2022 1104   RDW 13.2 11/15/2022 1104   LYMPHSABS 2.0 11/15/2022 1104   MONOABS 0.3 11/15/2022 1104   EOSABS 0.3 11/15/2022 1104   BASOSABS 0.1 11/15/2022 1104    No results found for: "POCLITH", "LITHIUM"   No results found for: "PHENYTOIN", "PHENOBARB", "VALPROATE", "CBMZ"   .res Assessment: Plan:   32 minutes spent dedicated to the care of this patient on the date of this encounter to include pre-visit review of records, ordering of medication, post visit documentation, and face-to-face time with the patient discussing response to recently starting therapy with a therapist that she feels comfortable with. Agreed that therapy is likely to help with management of anxiety symptoms, which may also benefit sleep. Discussed that Magnesium may have some benefit with sleep and recommended moving administration time of Magnesium to bedtime to possibly improve sleep quality.  Pt reports that  she prefers not to make any medication changes at this time and to focus on improving symptoms with therapist.  Will continue Viibryd 30 mg daily for depression and anxiety.  Continue Lamictal 100-150 mg daily for mood symptoms.  Continue Trazodone prn insomnia.  Continue Klonopin 1 mg po TID prn anxiety.  Recommend continuing psychotherapy.  Pt to follow-up in 3-4 months or sooner if clinically indicated.  Patient advised to contact office with any questions, adverse effects, or acute worsening in signs and symptoms.   Zaakirah was seen today for follow-up.  Diagnoses and all orders for this visit:  Generalized anxiety disorder -     clonazePAM (KLONOPIN) 1 MG tablet; Take 1 tablet (1 mg total) by mouth 3 (three) times daily as needed. TAKE 1 TABLET BY MOUTH TWICE A DAY THEN 1 TAB AS NEEDED FOR ANXIETY -     Vilazodone HCl 20 MG TABS; TAKE 1 AND 1/2 TABLETS DAILY BY MOUTH  PTSD (post-traumatic stress disorder) -     clonazePAM (KLONOPIN) 1 MG tablet; Take 1 tablet (1 mg total) by mouth 3 (three) times daily as needed. TAKE 1 TABLET BY MOUTH TWICE A DAY THEN 1 TAB AS NEEDED FOR ANXIETY -     Vilazodone HCl 20 MG TABS; TAKE 1 AND 1/2 TABLETS DAILY BY MOUTH  Recurrent major depressive disorder, in partial remission (HCC) -     lamoTRIgine (LAMICTAL)  100 MG tablet; TAKE 1 to 1.5 TABS BY MOUTH EVERY DAY -     Vilazodone HCl 20 MG TABS; TAKE 1 AND 1/2 TABLETS DAILY BY MOUTH  Insomnia due to mental disorder -     traZODone (DESYREL) 100 MG tablet; Take 1 tablet (100 mg total) by mouth at bedtime as needed for sleep.     Please see After Visit Summary for patient specific instructions.  Future Appointments  Date Time Provider Department Center  11/21/2023  1:00 PM Jarold Motto, [place] [facility]    No orders of the defined types were placed in this encounter.     -------------------------------

## 2022-11-29 ENCOUNTER — Encounter: Payer: Self-pay | Admitting: Psychiatry

## 2022-12-13 DIAGNOSIS — F4312 Post-traumatic stress disorder, chronic: Secondary | ICD-10-CM | POA: Diagnosis not present

## 2023-01-17 DIAGNOSIS — F4312 Post-traumatic stress disorder, chronic: Secondary | ICD-10-CM | POA: Diagnosis not present

## 2023-02-07 DIAGNOSIS — F4312 Post-traumatic stress disorder, chronic: Secondary | ICD-10-CM | POA: Diagnosis not present

## 2023-03-14 ENCOUNTER — Ambulatory Visit: Payer: Self-pay | Admitting: Physician Assistant

## 2023-03-14 DIAGNOSIS — F339 Major depressive disorder, recurrent, unspecified: Secondary | ICD-10-CM | POA: Diagnosis not present

## 2023-03-14 DIAGNOSIS — F411 Generalized anxiety disorder: Secondary | ICD-10-CM | POA: Diagnosis not present

## 2023-03-14 DIAGNOSIS — F410 Panic disorder [episodic paroxysmal anxiety] without agoraphobia: Secondary | ICD-10-CM | POA: Diagnosis not present

## 2023-03-14 DIAGNOSIS — G4700 Insomnia, unspecified: Secondary | ICD-10-CM | POA: Diagnosis not present

## 2023-03-14 DIAGNOSIS — F4312 Post-traumatic stress disorder, chronic: Secondary | ICD-10-CM | POA: Diagnosis not present

## 2023-03-14 NOTE — Telephone Encounter (Signed)
 Chief Complaint: Right leg pain/back spasms Symptoms: Right leg pain and back spasms Frequency: A few months Pertinent Negatives: Patient denies numbness, weakness, difficulty breathing Disposition: [] ED /[] Urgent Care (no appt availability in office) / [x] Appointment(In office/virtual)/ []  [facility]/ [] Home Care/ [] Refused Recommended Disposition /[] [facility]/ []  Follow-up with PCP Additional Notes: Patient called in to make an appointment due to having muscle spasms in her back that she believes is originating from pain in her right leg/under her buttock. 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 Patient states she has leftover muscle relaxers but they are not effective and expired in 2022. Patient appt made for further evaluation.   Copied from CRM [id number]. Topic: Appointments - Appointment Scheduling >> Mar 14, 2023  5:08 PM Tiffany H wrote: Pink Word: Patient used to have muscle spasms frequently. Patient was written a prescription for muscle relaxers in 2023. Patient still had some in her bottle but they aren't as effective now.   Spasms went away for a while but have now come back. Tuesday/Wednesday the muscle spasms started again. Pain woke her up in a distorted position.   Leg pain has been present for a month, but now it's starting to affect her back. 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   Patient has been taking Tylenol and NSAIDs. Reason for Disposition  [1] MODERATE pain (e.g., interferes with normal activities, limping) AND [2] present > 3 days  Answer Assessment - Initial Assessment Questions 1. ONSET: "When did the pain start?"      A few months 2. LOCATION: "Where is the pain located?"      Right upper leg 3. PAIN: "How bad is the pain?"    (Scale 1-10; or mild, moderate, severe)   -  MILD (1-3): doesn't interfere with normal activities     -  MODERATE (4-7): interferes with normal activities (e.g., work or school) or awakens from sleep, limping    -  SEVERE (8-10): excruciating pain, unable to do any normal activities, unable to walk     Severe 4. WORK OR EXERCISE: "Has there been any recent work or exercise that involved this part of the body?"      Patient is hairdresser on feet all day 5. CAUSE: "What do you think is causing the leg pain?"     Unsure if it's muscle or nerve related 6. OTHER SYMPTOMS: "Do you have any other symptoms?" (e.g., chest pain, back pain, breathing difficulty, swelling, rash, fever, numbness, weakness)     Right side is back muscle spasms  Protocols used: Leg Pain-A-AH

## 2023-03-15 NOTE — Telephone Encounter (Signed)
 Noted.

## 2023-03-16 ENCOUNTER — Encounter: Payer: Self-pay | Admitting: Physician Assistant

## 2023-03-16 ENCOUNTER — Ambulatory Visit (INDEPENDENT_AMBULATORY_CARE_PROVIDER_SITE_OTHER): Payer: 59 | Admitting: Physician Assistant

## 2023-03-16 VITALS — BP 90/60 | HR 77 | Temp 98.3°F | Ht 65.0 in | Wt 112.5 lb

## 2023-03-16 DIAGNOSIS — G8929 Other chronic pain: Secondary | ICD-10-CM | POA: Diagnosis not present

## 2023-03-16 DIAGNOSIS — Z8261 Family history of arthritis: Secondary | ICD-10-CM | POA: Diagnosis not present

## 2023-03-16 DIAGNOSIS — M546 Pain in thoracic spine: Secondary | ICD-10-CM | POA: Diagnosis not present

## 2023-03-16 DIAGNOSIS — M2550 Pain in unspecified joint: Secondary | ICD-10-CM

## 2023-03-16 LAB — SEDIMENTATION RATE: Sed Rate: 3 mm/h (ref 0–20)

## 2023-03-16 LAB — C-REACTIVE PROTEIN: CRP: <1 mg/dL (ref 0.5–20.0)

## 2023-03-16 MED ORDER — GABAPENTIN 100 MG PO CAPS
100.0000 mg | ORAL_CAPSULE | Freq: Three times a day (TID) | ORAL | 3 refills | Status: DC | PRN
Start: 2023-03-16 — End: 2023-11-21

## 2023-03-16 MED ORDER — CYCLOBENZAPRINE HCL 10 MG PO TABS: 10.0000 mg | ORAL_TABLET | Freq: Three times a day (TID) | ORAL | 1 refills | Status: DC | PRN

## 2023-03-16 NOTE — Patient Instructions (Signed)
 It was great to see you!  Consider Lidocaine patches  Dr Sherryle Lis [email or web address]  Look at [employer] Video: Original 12 Minutes of Foundation Training with Dr. Myles Lipps  Trial gabapentin as needed  I have refilled flexeril as needed  I will be in touch with lab results and next steps!  Take care,  Jarold Motto PA-C

## 2023-03-16 NOTE — Progress Notes (Signed)
 Breanna Young is a 42 y.o. female here for a new problem.  History of Present Illness:   Chief Complaint  Patient presents with   Spasms    Pt c/o muscle spasms from mid back radiating down all the way down right leg. Started 3 months ago.    HPI  Muscle Spasms // Joint Pain Pt complains of right leg muscle spasms starting 2-3 months ago.  More recently last week on Tuesday/Wednesday, she had episodes of radiating pain from her mid-back down her right leg. Pain is more intense at her right buttock.  She also endorses tightness in her neck and shoulders; ongoing for over a year.  Has been seen by Dr. Jean Rosenthal of sports med in the past.  She has tried different stretches, stating it has helped loosen up her hip and knee joints and only slightly improve her back pain.  Pt is prescribed Cyclobenzaprine; only taken about 10 times since prescribed. OTC meds she has tried managing her pain with include Tylenol Extra Strength, Aleve, Advil, and Tylenol Arthritis. Pt has not tried gabapentin in the past.  Denies any urinary sx such as burning sensation, frequency, dysuria, or pelvic pressure.  She adds she has been seen by a multiple chiropractors in the past, with varying results.   Pt reports a fmhx of arthritis (mother) and osteoporosis and rheumatoid arthritis (maternal grandmother).  Past Medical History:  Diagnosis Date   Anxiety    Depression    GERD (gastroesophageal reflux disease)    Hiatal hernia    IBS (irritable bowel syndrome)      Social History   Tobacco Use   Smoking status: Never   Smokeless tobacco: Never  Vaping Use   Vaping status: Never Used  Substance Use Topics   Alcohol use: Not Currently   Drug use: Never    Past Surgical History:  Procedure Laterality Date   ESOPHAGEAL DILATION  03/26/2019   Procedure: ESOPHAGEAL DILATION;  Surgeon: Shellia Cleverly, DO;  Location: [facility];  Service: Gastroenterology;;   ESOPHAGOGASTRODUODENOSCOPY (EGD)  WITH PROPOFOL N/A 03/26/2019   Procedure: ESOPHAGOGASTRODUODENOSCOPY (EGD) WITH PROPOFOL;  Surgeon: Shellia Cleverly, DO;  Location: [facility];  Service: Gastroenterology;  Laterality: N/A;  Food Bolus in stomach removed   TONSILLECTOMY     TRANSORAL INCISIONLESS FUNDOPLICATION N/A 03/26/2019   Procedure: TRANSORAL INCISIONLESS FUNDOPLICATION;  Surgeon: Shellia Cleverly, DO;  Location: [facility];  Service: Gastroenterology;  Laterality: N/A;    Family History  Problem Relation Age of Onset   Depression Mother    Diabetes Mother    GER disease Mother        said she had it for a long time   Hyperlipidemia Mother    Hypertension Mother    Arthritis Mother    Heart attack Father 34   Diabetes Father    Depression Maternal Grandmother    Endometrial cancer Maternal Grandmother    Arthritis Maternal Grandmother    Hypertension Maternal Grandmother    Rheum arthritis Maternal Grandmother    Dementia Maternal Grandfather    Diabetes Maternal Grandfather    Stroke Maternal Grandfather    Dementia Paternal Grandmother    Arthritis Paternal Grandmother    Heart disease Paternal Grandfather    Cancer Maternal Great-grandmother        gynecological cancer   Colon cancer Neg Hx    Breast cancer Neg Hx    Esophageal cancer Neg Hx    Stomach cancer Neg Hx  Rectal cancer Neg Hx     Allergies  Allergen Reactions   Aluminum-Containing Compounds     Rash/itchy   Avelox [Moxifloxacin Hcl In Nacl] Other (See Comments)    Unknown reaction type (possible rash reaction)    Dexlansoprazole Other (See Comments)    Unknown reaction type (possible rash reaction)   Doxazosin Other (See Comments)    Dizziness/faint   Reglan [Metoclopramide]     Hallucinations and a contraindication to her viibyrd    Current Medications:   Current Outpatient Medications:    Ascorbic Acid (VITAMIN C PO), Take 500 mg by mouth every other day., Disp: , Rfl:    calcium citrate (CALCITRATE - DOSED IN  MG ELEMENTAL CALCIUM) 950 (200 Ca) MG tablet, Take 200 mg of elemental calcium by mouth daily., Disp: , Rfl:    Cholecalciferol (VITAMIN D3) 125 MCG (5000 UT) CAPS, Take by mouth., Disp: , Rfl:    clonazePAM (KLONOPIN) 1 MG tablet, Take 1 tablet (1 mg total) by mouth 3 (three) times daily as needed. TAKE 1 TABLET BY MOUTH TWICE A DAY THEN 1 TAB AS NEEDED FOR ANXIETY, Disp: 90 tablet, Rfl: 5   Collagenase POWD, Take 1 Scoop by mouth daily in the afternoon., Disp: , Rfl:    docusate sodium (COLACE) 100 MG capsule, Take 100 mg by mouth daily as needed for mild constipation., Disp: , Rfl:    esomeprazole (NEXIUM) 40 MG capsule, Take 1 capsule by mouth daily in the afternoon., Disp: , Rfl:    ferrous sulfate 325 (65 FE) MG tablet, Take 325 mg by mouth every other day., Disp: , Rfl:    gabapentin (NEURONTIN) 100 MG capsule, Take 1 capsule (100 mg total) by mouth 3 (three) times daily as needed (nerve pain)., Disp: 90 capsule, Rfl: 3   ibuprofen (ADVIL) 600 MG tablet, Take 1 tablet (600 mg total) by mouth every 6 (six) hours as needed., Disp: 30 tablet, Rfl: 0   lamoTRIgine (LAMICTAL) 100 MG tablet, TAKE 1 to 1.5 TABS BY MOUTH EVERY DAY (Patient taking differently: Take 100 mg by mouth daily.), Disp: 135 tablet, Rfl: 1   levonorgestrel (MIRENA) 20 MCG/24HR IUD, 1 each by Intrauterine route once. , Disp: , Rfl:    MAGNESIUM CITRATE PO, Take by mouth., Disp: , Rfl:    Misc Natural Products (SUPER GREENS) POWD, Take 1 Scoop by mouth daily in the afternoon., Disp: , Rfl:    Multiple Vitamin (MULTIVITAMIN ADULT) TABS, Take 1 tablet by mouth daily in the afternoon., Disp: , Rfl:    Omega-3 Fatty Acids (FISH OIL) 1000 MG CAPS, Take by mouth., Disp: , Rfl:    ondansetron (ZOFRAN) 4 MG tablet, Take 4 mg by mouth every 8 (eight) hours as needed for nausea or vomiting., Disp: , Rfl:    Probiotic Product (PROBIOTIC PO), Take 1 capsule by mouth daily., Disp: , Rfl:    promethazine (PHENERGAN) 25 MG tablet, Take 1  tablet (25 mg total) by mouth every 6 (six) hours as needed for nausea or vomiting., Disp: 30 tablet, Rfl: 0   SUMAtriptan (IMITREX) 50 MG tablet, TAKE 1 TAB EVERY 2 HRS AS NEEDED FOR MIGRAINE. MAY REPEAT IN 2 HOURS IF HEADACHE PERSISTS OR RECURS., Disp: 8 tablet, Rfl: 1   traZODone (DESYREL) 100 MG tablet, Take 1 tablet (100 mg total) by mouth at bedtime as needed for sleep., Disp: 90 tablet, Rfl: 1   tretinoin (RETIN-A) 0.025 % cream, Apply 1 Application topically at bedtime., Disp: , Rfl:  Vilazodone HCl 20 MG TABS, TAKE 1 AND 1/2 TABLETS DAILY BY MOUTH, Disp: 135 tablet, Rfl: 1   cyclobenzaprine (FLEXERIL) 10 MG tablet, Take 1 tablet (10 mg total) by mouth 3 (three) times daily as needed for muscle spasms., Disp: 30 tablet, Rfl: 1   fluorouracil (EFUDEX) 5 % cream, Apply 1 Application topically 2 (two) times daily. (Patient not taking: Reported on 03/16/2023), Disp: , Rfl:    Review of Systems:   Negative unless otherwise specified per HPI.  Vitals:   Vitals:   03/16/23 1030  BP: 90/60  Pulse: 77  Temp: 98.3 F (36.8 C)  TempSrc: Temporal  SpO2: 99%  Weight: 112 lb 8 oz (51 kg)  Height: 5\' 5"  (1.651 m)     Body mass index is 18.72 kg/m.  Physical Exam:   Physical Exam Constitutional:      Appearance: Normal appearance. She is well-developed.  HENT:     Head: Normocephalic and atraumatic.  Eyes:     General: Lids are normal.     Extraocular Movements: Extraocular movements intact.     Conjunctiva/sclera: Conjunctivae normal.  Pulmonary:     Effort: Pulmonary effort is normal.  Musculoskeletal:        General: Normal range of motion.     Cervical back: Normal range of motion and neck supple.     Comments: Tenderness to palpation to right sacroiliac joint No decreased range of motion appreciated No bony tenderness  Skin:    General: Skin is warm and dry.  Neurological:     Mental Status: She is alert and oriented to person, place, and time.  Psychiatric:         Attention and Perception: Attention and perception normal.        Mood and Affect: Mood normal.        Behavior: Behavior normal.        Thought Content: Thought content normal.        Judgment: Judgment normal.     Assessment and Plan:   Chronic right-sided thoracic back pain (Primary); Arthralgia, unspecified joint;  Family history of rheumatoid arthritis Uncontrolled She has significant neck and back pain that seem out of proportion to her job duties and doesn't really respond to traditional treatments (prednisone, NSAIDs, etc.) Will update blood work to rule out possible organic cause or autoimmune cause of symptom(s) with low threshold to refer to rheumatology if anything is abnormal Recommend Goodman's back exercises (showed Youtube video) Recommend lidocaine patches Refill lidocaine Trial gabapentin 100 mg three times daily as needed -- sleepy precautions advised She is unable to afford physical therapy currently Consider [facility] as well  If new/worsening symptom(s) in the meantime, may need to obtain additional imaging - ANA - C-reactive protein - CYCLIC CITRUL PEPTIDE ANTIBODY, IGG/IGA - Rheumatoid factor - Sedimentation rate    I, Isabelle Course, acting as a Neurosurgeon for [employer], [place]., have documented all relevant documentation on the behalf of Jarold Motto, [place], as directed by  Jarold Motto, PA while in the presence of Jarold Motto, [place].  I, Jarold Motto, [place], have reviewed all documentation for this visit. The documentation on 03/16/23 for the exam, diagnosis, procedures, and orders are all accurate and complete.  I spent a total of 36 minutes on this visit, today 03/16/23, which included reviewing previous notes from sports medicine, ordering tests, discussing plan of care with patient and using shared-decision making on next steps, refilling medications, and documenting the findings in the note.  Jarold Motto, PA-C

## 2023-03-18 LAB — RHEUMATOID FACTOR: Rheumatoid fact SerPl-aCnc: <10 [IU]/mL (ref <14)

## 2023-03-18 LAB — ANA: Anti Nuclear Antibody (ANA): NEGATIVE

## 2023-03-20 ENCOUNTER — Encounter: Payer: Self-pay | Admitting: Physician Assistant

## 2023-03-21 LAB — CYCLIC CITRUL PEPTIDE ANTIBODY, IGG/IGA: Cyclic Citrullin Peptide Ab: 3 U (ref 0–19)

## 2023-04-11 DIAGNOSIS — F4312 Post-traumatic stress disorder, chronic: Secondary | ICD-10-CM | POA: Diagnosis not present

## 2023-05-16 DIAGNOSIS — F4312 Post-traumatic stress disorder, chronic: Secondary | ICD-10-CM | POA: Diagnosis not present

## 2023-06-06 DIAGNOSIS — F339 Major depressive disorder, recurrent, unspecified: Secondary | ICD-10-CM | POA: Diagnosis not present

## 2023-06-06 DIAGNOSIS — G4700 Insomnia, unspecified: Secondary | ICD-10-CM | POA: Diagnosis not present

## 2023-06-06 DIAGNOSIS — F411 Generalized anxiety disorder: Secondary | ICD-10-CM | POA: Diagnosis not present

## 2023-06-06 DIAGNOSIS — F410 Panic disorder [episodic paroxysmal anxiety] without agoraphobia: Secondary | ICD-10-CM | POA: Diagnosis not present

## 2023-06-13 DIAGNOSIS — F4312 Post-traumatic stress disorder, chronic: Secondary | ICD-10-CM | POA: Diagnosis not present

## 2023-08-08 DIAGNOSIS — Z01419 Encounter for gynecological examination (general) (routine) without abnormal findings: Secondary | ICD-10-CM | POA: Diagnosis not present

## 2023-09-05 DIAGNOSIS — F4312 Post-traumatic stress disorder, chronic: Secondary | ICD-10-CM | POA: Diagnosis not present

## 2023-09-19 DIAGNOSIS — G4700 Insomnia, unspecified: Secondary | ICD-10-CM | POA: Diagnosis not present

## 2023-09-19 DIAGNOSIS — F411 Generalized anxiety disorder: Secondary | ICD-10-CM | POA: Diagnosis not present

## 2023-09-19 DIAGNOSIS — F410 Panic disorder [episodic paroxysmal anxiety] without agoraphobia: Secondary | ICD-10-CM | POA: Diagnosis not present

## 2023-09-19 DIAGNOSIS — F339 Major depressive disorder, recurrent, unspecified: Secondary | ICD-10-CM | POA: Diagnosis not present

## 2023-10-17 DIAGNOSIS — F3341 Major depressive disorder, recurrent, in partial remission: Secondary | ICD-10-CM | POA: Diagnosis not present

## 2023-10-17 DIAGNOSIS — F411 Generalized anxiety disorder: Secondary | ICD-10-CM | POA: Diagnosis not present

## 2023-10-17 DIAGNOSIS — G4700 Insomnia, unspecified: Secondary | ICD-10-CM | POA: Diagnosis not present

## 2023-10-17 DIAGNOSIS — F410 Panic disorder [episodic paroxysmal anxiety] without agoraphobia: Secondary | ICD-10-CM | POA: Diagnosis not present

## 2023-10-24 DIAGNOSIS — F4312 Post-traumatic stress disorder, chronic: Secondary | ICD-10-CM | POA: Diagnosis not present

## 2023-10-24 DIAGNOSIS — Z7142 Counseling for family member of alcoholic: Secondary | ICD-10-CM | POA: Diagnosis not present

## 2023-10-31 DIAGNOSIS — Z1231 Encounter for screening mammogram for malignant neoplasm of breast: Secondary | ICD-10-CM | POA: Diagnosis not present

## 2023-10-31 LAB — HM MAMMOGRAPHY

## 2023-11-21 ENCOUNTER — Ambulatory Visit: Payer: 59 | Admitting: Physician Assistant

## 2023-11-21 ENCOUNTER — Encounter: Payer: Self-pay | Admitting: Physician Assistant

## 2023-11-21 VITALS — BP 100/60 | HR 69 | Temp 98.2°F | Ht 65.0 in | Wt 113.0 lb

## 2023-11-21 DIAGNOSIS — Z7142 Counseling for family member of alcoholic: Secondary | ICD-10-CM | POA: Diagnosis not present

## 2023-11-21 DIAGNOSIS — F332 Major depressive disorder, recurrent severe without psychotic features: Secondary | ICD-10-CM

## 2023-11-21 DIAGNOSIS — E559 Vitamin D deficiency, unspecified: Secondary | ICD-10-CM | POA: Diagnosis not present

## 2023-11-21 DIAGNOSIS — Z0000 Encounter for general adult medical examination without abnormal findings: Secondary | ICD-10-CM

## 2023-11-21 DIAGNOSIS — F4312 Post-traumatic stress disorder, chronic: Secondary | ICD-10-CM | POA: Diagnosis not present

## 2023-11-21 DIAGNOSIS — M2550 Pain in unspecified joint: Secondary | ICD-10-CM

## 2023-11-21 DIAGNOSIS — D509 Iron deficiency anemia, unspecified: Secondary | ICD-10-CM | POA: Diagnosis not present

## 2023-11-21 DIAGNOSIS — E538 Deficiency of other specified B group vitamins: Secondary | ICD-10-CM | POA: Diagnosis not present

## 2023-11-21 DIAGNOSIS — Z8249 Family history of ischemic heart disease and other diseases of the circulatory system: Secondary | ICD-10-CM

## 2023-11-21 DIAGNOSIS — Z1211 Encounter for screening for malignant neoplasm of colon: Secondary | ICD-10-CM

## 2023-11-21 LAB — COMPREHENSIVE METABOLIC PANEL WITH GFR
ALT: 13 U/L (ref 0–35)
AST: 14 U/L (ref 0–37)
Albumin: 4.7 g/dL (ref 3.5–5.2)
Alkaline Phosphatase: 57 U/L (ref 39–117)
BUN: 15 mg/dL (ref 6–23)
CO2: 30 meq/L (ref 19–32)
Calcium: 9.7 mg/dL (ref 8.4–10.5)
Chloride: 100 meq/L (ref 96–112)
Creatinine, Ser: 0.77 mg/dL (ref 0.40–1.20)
GFR: 93.83 mL/min (ref >60.00)
Glucose, Bld: 94 mg/dL (ref 70–99)
Potassium: 4.1 meq/L (ref 3.5–5.1)
Sodium: 137 meq/L (ref 135–145)
Total Bilirubin: 0.5 mg/dL (ref 0.2–1.2)
Total Protein: 6.4 g/dL (ref 6.0–8.3)

## 2023-11-21 LAB — CBC WITH DIFFERENTIAL/PLATELET
Basophils Absolute: 0.1 K/uL (ref 0.0–0.1)
Basophils Relative: 0.8 % (ref 0.0–3.0)
Eosinophils Absolute: 0.1 K/uL (ref 0.0–0.7)
Eosinophils Relative: 1.1 % (ref 0.0–5.0)
HCT: 41.6 % (ref 36.0–46.0)
Hemoglobin: 14.2 g/dL (ref 12.0–15.0)
Lymphocytes Relative: 23.2 % (ref 12.0–46.0)
Lymphs Abs: 2.1 K/uL (ref 0.7–4.0)
MCHC: 34.1 g/dL (ref 30.0–36.0)
MCV: 92.8 fl (ref 78.0–100.0)
Monocytes Absolute: 0.4 K/uL (ref 0.1–1.0)
Monocytes Relative: 4.2 % (ref 3.0–12.0)
Neutro Abs: 6.4 K/uL (ref 1.4–7.7)
Neutrophils Relative %: 70.7 % (ref 43.0–77.0)
Platelets: 213 K/uL (ref 150.0–400.0)
RBC: 4.48 Mil/uL (ref 3.87–5.11)
RDW: 12.9 % (ref 11.5–15.5)
WBC: 9 K/uL (ref 4.0–10.5)

## 2023-11-21 LAB — IBC + FERRITIN
Ferritin: 82.7 ng/mL (ref 10.0–291.0)
Iron: 78 ug/dL (ref 42–145)
Saturation Ratios: 27 % (ref 20.0–50.0)
TIBC: 288.4 ug/dL (ref 250.0–450.0)
Transferrin: 206 mg/dL — ABNORMAL LOW (ref 212.0–360.0)

## 2023-11-21 LAB — LIPID PANEL
Cholesterol: 171 mg/dL (ref 0–200)
HDL: 70.3 mg/dL (ref >39.00)
LDL Cholesterol: 92 mg/dL (ref 0–99)
NonHDL: 101.04
Total CHOL/HDL Ratio: 2
Triglycerides: 47 mg/dL (ref 0.0–149.0)
VLDL: 9.4 mg/dL (ref 0.0–40.0)

## 2023-11-21 LAB — VITAMIN B12: Vitamin B-12: 415 pg/mL (ref 211–911)

## 2023-11-21 LAB — VITAMIN D 25 HYDROXY (VIT D DEFICIENCY, FRACTURES): VITD: 37.57 ng/mL (ref 30.00–100.00)

## 2023-11-21 LAB — TSH: TSH: 0.55 u[IU]/mL (ref 0.35–5.50)

## 2023-11-21 MED ORDER — CELECOXIB 100 MG PO CAPS: 100.0000 mg | ORAL_CAPSULE | Freq: Two times a day (BID) | ORAL | 1 refills | Status: AC | PRN

## 2023-11-21 MED ORDER — METHOCARBAMOL 500 MG PO TABS: 500.0000 mg | ORAL_TABLET | Freq: Three times a day (TID) | ORAL | 0 refills | Status: AC | PRN

## 2023-11-21 NOTE — Progress Notes (Signed)
 Subjective:    Breanna Young is a 43 y.o. female and is here for a comprehensive physical exam.  HPI  Health Maintenance Due  Topic Date Due   Mammogram  10/18/2023   Discussed the use of AI scribe software for clinical note transcription with the patient, who gave verbal consent to proceed.  History of Present Illness   Breanna Young is a 43 year old female who presents with fatigue and muscle pain.  She experiences significant fatigue and muscle pain, feeling 'burned out' and 'tired' due to her busy schedule working two jobs. Despite this, her energy levels remain low. She started Vraylar  about a month and a half ago, which has helped but increased restlessness. She has difficulty sleeping, often staying up late and waking up early, attributing this to stress and Vraylar .  Muscle pain and stiffness are particularly noted in her back and neck, with her back feeling 'like concrete'. Gabapentin  and muscle relaxers like Flexeril  and Zanaflex have not provided significant relief. Various home remedies and exercises have been tried without success.  Current medications include Vraylar , magnesium citrate, and vitamin D . She is not taking B12 supplements. She has an IUD with occasional spotting but no regular periods. She does not consume alcohol and has abstained for over two years.  There is a family history of significant cardiac problems and malignant polyps. She has not had a colonoscopy. A history of gastrointestinal surgery affects her dietary habits and food tolerance. She maintains a balanced diet despite challenges.  No chest pain, shortness of breath, unexplained headaches, numbness, tingling, tremors, carpal tunnel symptoms, or leg swelling.       Health Maintenance: Immunizations -- declined flu shot Colonoscopy -- n/a Mammogram -- UpToDate  PAP -- UpToDate  Bone Density -- n/a Diet -- trying to eat healthy Exercise -- regular exercise  Sleep habits -- no major  concerns Mood -- no major concerns  UTD with dentist? - yes UTD with eye doctor? - no  Weight history: Wt Readings from Last 10 Encounters:  11/21/23 113 lb (51.3 kg)  03/16/23 112 lb 8 oz (51 kg)  11/15/22 113 lb 8 oz (51.5 kg)  02/22/22 119 lb (54 kg)  12/28/21 125 lb (56.7 kg)  11/30/21 125 lb (56.7 kg)  10/26/21 128 lb (58.1 kg)  10/05/21 129 lb (58.5 kg)  09/21/21 127 lb (57.6 kg)  12/08/19 136 lb (61.7 kg)   Body mass index is 18.8 kg/m. No LMP recorded. (Menstrual status: IUD).  Alcohol use:  reports that she does not currently use alcohol.  Tobacco use:  Tobacco Use: Low Risk  (11/21/2023)   Patient History    Smoking Tobacco Use: Never    Smokeless Tobacco Use: Never    Passive Exposure: Not on file   Eligible for lung cancer screening? no     03/16/2023   11:14 AM  Depression screen PHQ 2/9  Decreased Interest 2  Down, Depressed, Hopeless 1  PHQ - 2 Score 3  Altered sleeping 1  Tired, decreased energy 2  Change in appetite 1  Feeling bad or failure about yourself  1  Trouble concentrating 1  Moving slowly or fidgety/restless 0  Suicidal thoughts 0  PHQ-9 Score 9  Difficult doing work/chores Somewhat difficult     Other providers/specialists: Patient Care Team: Job Lukes, [place] as PCP - General (Physician Assistant)    PMHx, SurgHx, SocialHx, Medications, and Allergies were reviewed in the Visit Navigator and updated as appropriate.   Past  Medical History:  Diagnosis Date   Anxiety    Depression    GERD (gastroesophageal reflux disease)    Hiatal hernia    IBS (irritable bowel syndrome)      Past Surgical History:  Procedure Laterality Date   ESOPHAGEAL DILATION  03/26/2019   Procedure: ESOPHAGEAL DILATION;  Surgeon: San Sandor GAILS, DO;  Location: [facility];  Service: Gastroenterology;;   ESOPHAGOGASTRODUODENOSCOPY (EGD) WITH PROPOFOL  N/A 03/26/2019   Procedure: ESOPHAGOGASTRODUODENOSCOPY (EGD) WITH PROPOFOL ;  Surgeon:  San Sandor GAILS, DO;  Location: [facility];  Service: Gastroenterology;  Laterality: N/A;  Food Bolus in stomach removed   TONSILLECTOMY     TRANSORAL INCISIONLESS FUNDOPLICATION N/A 03/26/2019   Procedure: TRANSORAL INCISIONLESS FUNDOPLICATION;  Surgeon: San Sandor GAILS, DO;  Location: [facility];  Service: Gastroenterology;  Laterality: N/A;     Family History  Problem Relation Age of Onset   Depression Mother    Diabetes Mother    GER disease Mother        said she had it for a long time   Hyperlipidemia Mother    Hypertension Mother    Arthritis Mother    Heart attack Father 16   Diabetes Father    Depression Maternal Grandmother    Endometrial cancer Maternal Grandmother    Arthritis Maternal Grandmother    Hypertension Maternal Grandmother    Rheum arthritis Maternal Grandmother    Dementia Maternal Grandfather    Diabetes Maternal Grandfather    Stroke Maternal Grandfather    Dementia Paternal Grandmother    Arthritis Paternal Grandmother    Heart disease Paternal Grandfather    Cancer Maternal Great-grandmother        gynecological cancer   Colon cancer Neg Hx    Breast cancer Neg Hx    Esophageal cancer Neg Hx    Stomach cancer Neg Hx    Rectal cancer Neg Hx     Social History   Tobacco Use   Smoking status: Never   Smokeless tobacco: Never  Vaping Use   Vaping status: Never Used  Substance Use Topics   Alcohol use: Not Currently   Drug use: Never    Review of Systems:   Review of Systems  Constitutional:  Negative for chills, fever, malaise/fatigue and weight loss.  HENT:  Negative for hearing loss, sinus pain and sore throat.   Respiratory:  Negative for cough and hemoptysis.   Cardiovascular:  Negative for chest pain, palpitations, leg swelling and PND.  Gastrointestinal:  Negative for abdominal pain, constipation, diarrhea, heartburn, nausea and vomiting.  Genitourinary:  Negative for dysuria, frequency and urgency.  Musculoskeletal:   Negative for back pain, myalgias and neck pain.  Skin:  Negative for itching and rash.  Neurological:  Negative for dizziness, tingling, seizures and headaches.  Endo/Heme/Allergies:  Negative for polydipsia.  Psychiatric/Behavioral:  Negative for depression. The patient is not nervous/anxious.     Objective:   BP 100/60 (BP Location: Left Arm, Patient Position: Sitting, Cuff Size: Normal)   Pulse 69   Temp 98.2 F (36.8 C) (Temporal)   Ht 5' 5 (1.651 m)   Wt 113 lb (51.3 kg)   SpO2 99%   BMI 18.80 kg/m  Body mass index is 18.8 kg/m.   General Appearance:    Alert, cooperative, no distress, appears stated age  Head:    Normocephalic, without obvious abnormality, atraumatic  Eyes:    PERRL, conjunctiva/corneas clear, EOM's intact, fundi    benign, both eyes  Ears:  Normal TM's and external ear canals, both ears  Nose:   Nares normal, septum midline, mucosa normal, no drainage    or sinus tenderness  Throat:   Lips, mucosa, and tongue normal; teeth and gums normal  Neck:   Supple, symmetrical, trachea midline, no adenopathy;    thyroid :  no enlargement/tenderness/nodules; no carotid   bruit or JVD  Back:     Symmetric, no curvature, ROM normal, no CVA tenderness  Lungs:     Clear to auscultation bilaterally, respirations unlabored  Chest Wall:    No tenderness or deformity   Heart:    Regular rate and rhythm, S1 and S2 normal, no murmur, rub or gallop  Breast Exam:    Deferred  Abdomen:     Soft, non-tender, bowel sounds active all four quadrants,    no masses, no organomegaly  Genitalia:    Deferred   Extremities:   Extremities normal, atraumatic, no cyanosis or edema  Pulses:   2+ and symmetric all extremities  Skin:   Skin color, texture, turgor normal, no rashes or lesions  Lymph nodes:   Cervical, supraclavicular, and axillary nodes normal  Neurologic:   CNII-XII intact, normal strength, sensation and reflexes    throughout    Assessment/Plan:   Assessment and  Plan    Adult Wellness Visit Routine wellness visit with focus on health maintenance, screenings, and lifestyle modifications. Iron deficiency anemia; vitamin D  deficiency; B12 deficiency - Ordered blood work: iron, thyroid , cholesterol, glucose, vitamin D , B12. - Discussed colonoscopy screening due to family history of malignant polyps. - Discussed calcium score CT scan due to family history of heart disease.  Severe recurrent major depression without psychotic features (HCC)  Managed with Vraylar . Reports improvement but experiences restlessness and early waking. Prefers to continue Vraylar  despite side effects. - Continue Vraylar  as prescribed.  Arthralgia, unspecified joint  Chronic pain in back and neck, exacerbated by work and stress. Previous treatments ineffective. Considering alternatives due to cost and effectiveness of physical therapy. - Prescribed Robaxin (methocarbamol) for muscle relaxation. - Consider Celebrex for anti-inflammatory effects, with caution due to stomach issues. - Discuss potential use of turmeric and tart cherry supplements for pain relief. - Consider Lyrica if other treatments are ineffective.  Screening for colon cancer due to family history Family history of malignant polyps. Discussed importance of colonoscopy screening given age and family history. - Ordered colonoscopy screening.  Screening for coronary artery disease due to family history Significant family history of heart disease. Discussed potential benefit of calcium score CT scan to assess coronary artery disease risk. - Ordered calcium score CT scan to assess coronary artery disease risk.      Lucie Buttner, PA-C [facility]

## 2023-11-21 NOTE — Patient Instructions (Signed)
 A coronary CT calcium scan is a computed tomography scan of the heart for the assessment of severity of coronary artery disease. Specifically, it looks for calcium deposits in atherosclerotic plaques in the coronary arteries that can narrow arteries and increase the risk of heart attack. This is $99 with [facility] currently. Let me know your thoughts.

## 2023-11-22 ENCOUNTER — Ambulatory Visit: Payer: Self-pay | Admitting: Physician Assistant

## 2023-12-05 ENCOUNTER — Ambulatory Visit (HOSPITAL_BASED_OUTPATIENT_CLINIC_OR_DEPARTMENT_OTHER)
Admission: RE | Admit: 2023-12-05 | Discharge: 2023-12-05 | Disposition: A | Discharge status: Home / Self Care | Payer: Self-pay | Source: Ambulatory Visit | Attending: Physician Assistant | Admitting: Physician Assistant

## 2023-12-05 DIAGNOSIS — Z8249 Family history of ischemic heart disease and other diseases of the circulatory system: Secondary | ICD-10-CM | POA: Insufficient documentation

## 2023-12-07 ENCOUNTER — Other Ambulatory Visit: Payer: Self-pay | Admitting: Physician Assistant

## 2023-12-07 DIAGNOSIS — Z8249 Family history of ischemic heart disease and other diseases of the circulatory system: Secondary | ICD-10-CM

## 2023-12-07 DIAGNOSIS — R931 Abnormal findings on diagnostic imaging of heart and coronary circulation: Secondary | ICD-10-CM

## 2023-12-07 MED ORDER — ROSUVASTATIN CALCIUM 10 MG PO TABS: 10.0000 mg | ORAL_TABLET | Freq: Every day | ORAL | 3 refills | Status: AC

## 2023-12-26 DIAGNOSIS — Z7142 Counseling for family member of alcoholic: Secondary | ICD-10-CM | POA: Diagnosis not present

## 2023-12-26 DIAGNOSIS — F4312 Post-traumatic stress disorder, chronic: Secondary | ICD-10-CM | POA: Diagnosis not present

## 2024-01-02 DIAGNOSIS — F3341 Major depressive disorder, recurrent, in partial remission: Secondary | ICD-10-CM | POA: Diagnosis not present

## 2024-01-02 DIAGNOSIS — G4700 Insomnia, unspecified: Secondary | ICD-10-CM | POA: Diagnosis not present

## 2024-01-02 DIAGNOSIS — F410 Panic disorder [episodic paroxysmal anxiety] without agoraphobia: Secondary | ICD-10-CM | POA: Diagnosis not present

## 2024-01-02 DIAGNOSIS — F411 Generalized anxiety disorder: Secondary | ICD-10-CM | POA: Diagnosis not present

## 2024-01-22 NOTE — Progress Notes (Unsigned)
 " Cardiology Office Note:  .   Date:  01/23/2024 ID:  Breanna Young, DOB Jan 25, 1980, MRN [id number] PCP: Job Lukes, PA [facility] Providers Cardiologist:  None   Patient Profile: .      PMH Coronary artery disease CT Calcium  score 12/05/23 CAC score 245 (no MESA age threshold) LM 0, LAD 154, LCx 37.6, RCA 53.9 Family history of heart disease Father - CAD, multiple stents, ICD Paternal GM - MI, sudden death in her siblings Paternal GF - Heart attack Depression Sinus bradycardia Fundoplication  in 2021 Hyperlipidemia       History of Present Illness: .    History of Present Illness Breanna Young is a very pleasant 44 year old female who presents for cardiovascular risk assessment and management. She is concerned about cardiovascular risk due to extensive paternal coronary disease. Her father survived a large myocardial infarction with significant cardiac damage and now has an internal defibrillator and multiple stents. Her paternal grandmother and several great-uncles died from myocardial infarctions, and her paternal grandfather had multiple open-heart surgeries. She had CT calcium  score that showed significant age advanced coronary calcification with CAC score of 245 with no MeSA age threshold, however likely placing her in the 99th percentile with distribution in LAD, LCx and RCA. She started rosuvastatin  about six weeks ago after receiving the CT results. She is not on antihypertensives and has no history of diabetes or tobacco use. Resting heart rate varies with stress and mood. She denies lightheadedness or presyncope. She is a self-employed scientist, research (medical) and cares for/feeds a parrot twice daily 365 days a year. She works long hours and has difficulty fitting in regular exercise. She has poor sleep hygiene, often staying up late and having fragmented sleep. In March 2021 she had transoral incisionless fundoplication for severe GERD. She remains on acid reflux medication  and has early satiety and nausea with some foods. She follows a vegetarian diet that includes fish, uses protein shakes and bars for protein, and tries to avoid high sodium and processed foods because of heart health concerns.   Family history: Her family history includes Arthritis in her maternal grandmother, mother, and paternal grandmother; Cancer in her maternal great-grandmother; Colon polyps in her paternal grandmother; Dementia in her maternal grandfather and paternal grandmother; Depression in her maternal grandmother and mother; Diabetes in her father, maternal grandfather, and mother; Endometrial cancer in her maternal grandmother; GER disease in her mother; Heart attack (age of onset: 72) in her father; Heart disease in her paternal grandfather; Hyperlipidemia in her mother; Hypertension in her maternal grandmother and mother; Rheum arthritis in her maternal grandmother; Stroke in her maternal grandfather.   Discussed the use of AI scribe software for clinical note transcription with the patient, who gave verbal consent to proceed.  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 No formal exercise  No results found for:  LIPOA    ROS: See HPI       Studies Reviewed: [name]   EKG Interpretation Date/Time:  Wednesday January 23 2024 14:40:17 EST Ventricular Rate:  46 PR Interval:  130 QRS Duration:  88 QT Interval:  450 QTC Calculation: 393 R Axis:   70  Text Interpretation: Sinus bradycardia No  previous ECGs available Confirmed by Percy Browning [phone]) on 01/23/2024 4:49:25 PM      Risk Assessment/Calculations:             Physical Exam:   VS: BP (!) 94/56   Pulse 63   Ht 5' 5 (1.651 m)   Wt 116 lb 6.4 oz (52.8 kg)   SpO2 99%   BMI 19.37 kg/m   Wt Readings from Last 3 Encounters:  01/23/24 116 lb 6.4 oz (52.8 kg)  11/21/23 113 lb (51.3 kg)  03/16/23 112 lb 8 oz (51 kg)     GEN: Well nourished, well developed in no acute distress NECK: No JVD; No carotid bruits CARDIAC: RRR, no murmurs, rubs, gallops RESPIRATORY:  Clear to auscultation without rales, wheezing or rhonchi  ABDOMEN: Soft, non-tender, non-distended EXTREMITIES:  No edema; No deformity     ASSESSMENT AND PLAN: .    Assessment & Plan Coronary artery disease  Cardiac risk Family history ASCVD Hyperlipidemia Family history of significant coronary artery disease led her to undergo CT calcium  score which revealed age advanced calcification with CAC score of 245.  There is no MeSA database for her age, however she would likely be in the 99th percentile given high score.  Calcification in 3 of 4 major arteries with bulk and LAD. Discussed plaque stabilization and potential genetic risk factors.  Lengthy discussion about healthy lifestyle and other risk factors that increase risk of ASCVD.  She does not have history of hypertension, diabetes, or smoking.  Although ASCVD risk score is low, she has started low-dose statin therapy for prevention. LDL was 92 on 11/21/23.  EKG today reveals sinus bradycardia, no ST/T abnormality.  She is active with standing at work but no formal exercise. She denies chest pain, dyspnea, or other symptoms concerning for angina.  Due to significant calcification, we will proceed with further ischemia evaluation. - Coronary CTA for evaluation of ischemia and mapping of coronary anatomy -No beta-blocker given low resting HR - Check lipoprotein A and apolipoprotein B  for further risk  stratification - Continue rosuvastatin  10 mg daily - Recheck lipid panel after 2 months of statin therapy - Heart healthy diet avoiding processed foods, saturated fat, sugar, and other simple carbohydrates encouraged - Be as physically active as possible every day and aim for at least 150 minutes of moderate intensity exercise each week  Gastroesophageal reflux disease status post TIF surgery   She is status post TIF surgery with ongoing symptoms of GERD.  Discussed dietary modifications to manage symptoms.  - Continue current acid reflux medication - Management per PCP/GI  Sinus bradycardia   Resting heart rate varies based on mood and stress per her report. She does not occasional resting HR in the 40s.  No lightheadedness, presyncope, syncope. She is not on AV nodal blocking agents.  - Continue to monitor heart rate and symptoms      Dispo: 3 months with me  Signed, Browning Percy, NP-C "

## 2024-01-23 ENCOUNTER — Ambulatory Visit (HOSPITAL_BASED_OUTPATIENT_CLINIC_OR_DEPARTMENT_OTHER): Admitting: Nurse Practitioner

## 2024-01-23 ENCOUNTER — Encounter (HOSPITAL_BASED_OUTPATIENT_CLINIC_OR_DEPARTMENT_OTHER): Payer: Self-pay | Admitting: Nurse Practitioner

## 2024-01-23 VITALS — BP 94/56 | HR 63 | Ht 65.0 in | Wt 116.4 lb

## 2024-01-23 DIAGNOSIS — Z8249 Family history of ischemic heart disease and other diseases of the circulatory system: Secondary | ICD-10-CM

## 2024-01-23 DIAGNOSIS — R931 Abnormal findings on diagnostic imaging of heart and coronary circulation: Secondary | ICD-10-CM

## 2024-01-23 DIAGNOSIS — R001 Bradycardia, unspecified: Secondary | ICD-10-CM | POA: Diagnosis not present

## 2024-01-23 DIAGNOSIS — K219 Gastro-esophageal reflux disease without esophagitis: Secondary | ICD-10-CM | POA: Diagnosis not present

## 2024-01-23 DIAGNOSIS — Z7689 Persons encountering health services in other specified circumstances: Secondary | ICD-10-CM

## 2024-01-23 DIAGNOSIS — Z7189 Other specified counseling: Secondary | ICD-10-CM

## 2024-01-23 DIAGNOSIS — E785 Hyperlipidemia, unspecified: Secondary | ICD-10-CM | POA: Diagnosis not present

## 2024-01-23 DIAGNOSIS — I2510 Atherosclerotic heart disease of native coronary artery without angina pectoris: Secondary | ICD-10-CM | POA: Diagnosis not present

## 2024-01-23 NOTE — Patient Instructions (Addendum)
 Medication Instructions:   Your physician recommends that you continue on your current medications as directed. Please refer to the Current Medication list given to you today.   *If you need a refill on your cardiac medications before your next appointment, please call your pharmacy*  Lab Work:  Your physician recommends that you return for a FASTING lipid profile/Lpa/Apolipo B, fasting after midnight, in 3 weeks to 1 month.   Patient given paperwork today.    If you have labs (blood work) drawn today and your tests are completely normal, you will receive your results only by: MyChart Message (if you have MyChart) OR A paper copy in the mail If you have any lab test that is abnormal or we need to change your treatment, we will call you to review the results.  Testing/Procedures:    Your cardiac CT will be scheduled at one of the below locations:   [facility] [address]  [place], [place] [address]  If scheduled at the [facility] at [address], please enter the parking lot using the [address] entrance and use the FREE valet service at the patient drop-off area. Enter the building and check-in with registration on the main floor.  Please follow these instructions carefully (unless otherwise directed):  An IV will be required for this test and Nitroglycerin will be given.    On the Night Before the Test: Be sure to Drink plenty of water. Do not consume any caffeinated/decaffeinated beverages or chocolate 12 hours prior to your test. Do not take any antihistamines 12 hours prior to your test.  On the Day of the Test: Drink plenty of water until 1 hour prior to the test. Do not eat any food 1 hour prior to test. You may take your regular medications prior to the test.  FEMALES- please wear underwire-free bra if available, avoid dresses & tight clothing  After the Test: Drink plenty of water. After receiving IV  contrast, you may experience a mild flushed feeling. This is normal. On occasion, you may experience a mild rash up to 24 hours after the test. This is not dangerous. If this occurs, you can take Benadryl 25 mg, Zyrtec, Claritin, or Allegra and increase your fluid intake. (Patients taking Tikosyn should avoid Benadryl, and may take Zyrtec, Claritin, or Allegra) If you experience trouble breathing, this can be serious. If it is severe call 911 IMMEDIATELY. If it is mild, please call our office.  We will call to schedule your test 2-4 weeks out understanding that some insurance companies will need an authorization prior to the service being performed.   For more information and frequently asked questions, please visit our website : [email or web address]  For non-scheduling related questions, please contact the cardiac imaging nurse navigator should you have any questions/concerns: Cardiac Imaging Nurse Navigators Direct Office Dial: [phone]   For scheduling needs, including cancellations and rescheduling, please call Brittany, [phone].   Follow-Up: At [facility], you and your health needs are our priority.  As part of our continuing mission to provide you with exceptional heart care, our providers are all part of one team.  This team includes your primary Cardiologist (physician) and Advanced Practice Providers or APPs (Physician Assistants and Nurse Practitioners) who all work together to provide you with the care you need, when you need it.  Your next appointment:   3 month(s)  Provider:   Rosaline Bane, NP    We recommend signing up  for the patient portal called MyChart.  Sign up information is provided on this After Visit Summary.  MyChart is used to connect with patients for Virtual Visits (Telemedicine).  Patients are able to view lab/test results, encounter notes, upcoming appointments, etc.  Non-urgent messages can be sent to your provider as well.    To learn more about what you can do with MyChart, go to [email or web address].   Other Instructions  Adopting a Healthy Lifestyle.   Weight: Know what a healthy weight is for you (roughly BMI <25) and aim to maintain this. You can calculate your body mass index on your smart phone. Unfortunately, this is not the most accurate measure of healthy weight, but it is the simplest measurement to use. A more accurate measurement involves body scanning which measures lean muscle, fat tissue and bony density. We do not have this equipment at [facility].    Diet: Aim for 7+ servings of fruits and vegetables daily Limit animal fats in diet for cholesterol and heart health - choose grass fed whenever available Avoid highly processed foods (fast food burgers, tacos, fried chicken, pizza, hot dogs, french fries)  Saturated fat comes in the form of butter, lard, coconut oil, margarine, partially hydrogenated oils, dairy products, and fat in meat. These increase your risk of cardiovascular disease.  Use healthy plant oils, such as olive, canola, soy, corn, sunflower and peanut.  Whole foods such as fruits, vegetables and whole grains have fiber  Men need > 38 grams of fiber per day Women need > 25 grams of fiber per day  Load up on vegetables and fruits - one-half of your plate: Aim for color and variety, and remember that potatoes dont count. Go for whole grains - one-quarter of your plate: Whole wheat, barley, wheat berries, quinoa, oats, brown rice, and foods made with them. If you want pasta, go with whole wheat pasta. Protein power - one-quarter of your plate: Fish, chicken, beans, and nuts are all healthy, versatile protein sources. Limit red meat. You need carbohydrates for energy! The type of carbohydrate is more important than the amount. Choose carbohydrates such as vegetables, fruits, whole grains, beans, and nuts in the place of white rice, white pasta, potatoes (baked or fried), macaroni and  cheese, cakes, cookies, and donuts.  If youre thirsty, drink water. Coffee and tea are good in moderation, but skip sugary drinks and limit milk and dairy products to one or two daily servings. Keep sugar intake at 6 teaspoons or 24 grams or LESS       Exercise: Aim for 150 min of moderate intensity exercise weekly for heart health, and weights twice weekly for bone health Stay active - any steps are better than no steps! Aim for 7-9 hours of sleep daily   Sleep: This provides your body with the reset and relaxation that it needs!  Aim to get 7-8 hours of sleep each night. Limit caffeine, screen time, and other distractions prior to bedtime.  Keep your bedroom cool and dark and do not wear heavy clothing to bed or use heavy bed covers - layer if needed.         Mediterranean Diet  Why follow it? Research shows Those who follow the Mediterranean diet have a reduced risk of heart disease  The diet is associated with a reduced incidence of Parkinson's and Alzheimer's diseases People following the diet may have longer life expectancies and lower rates of chronic diseases  The Dietary Guidelines for Americans recommends the Mediterranean  diet as an eating plan to promote health and prevent disease  What Is the Mediterranean Diet?  Healthy eating plan based on typical foods and recipes of Mediterranean-style cooking The diet is primarily a plant based diet; these foods should make up a majority of meals   Starches - Plant based foods should make up a majority of meals - They are an important sources of vitamins, minerals, energy, antioxidants, and fiber - Choose whole grains, foods high in fiber and minimally processed items  - Typical grain sources include wheat, oats, barley, corn, brown rice, bulgar, farro, millet, polenta, couscous  - Various types of beans include chickpeas, lentils, fava beans, black beans, white beans   Fruits  Veggies - Large quantities of antioxidant rich fruits  & veggies; 6 or more servings  - Vegetables can be eaten raw or lightly drizzled with oil and cooked  - Vegetables common to the traditional Mediterranean Diet include: artichokes, arugula, beets, broccoli, brussel sprouts, cabbage, carrots, celery, collard greens, cucumbers, eggplant, kale, leeks, lemons, lettuce, mushrooms, okra, onions, peas, peppers, potatoes, pumpkin, radishes, rutabaga, shallots, spinach, sweet potatoes, turnips, zucchini - Fruits common to the Mediterranean Diet include: apples, apricots, avocados, cherries, clementines, dates, figs, grapefruits, grapes, melons, nectarines, oranges, peaches, pears, pomegranates, strawberries, tangerines  Fats - Replace butter and margarine with healthy oils, such as olive oil, canola oil, and tahini  - Limit nuts to no more than a handful a day  - Nuts include walnuts, almonds, pecans, pistachios, pine nuts  - Limit or avoid candied, honey roasted or heavily salted nuts - Olives are central to the [employer] - can be eaten whole or used in a variety of dishes   Meats Protein - Limiting red meat: no more than a few times a month - When eating red meat: choose lean cuts and keep the portion to the size of deck of cards - Eggs: approx. 0 to 4 times a week  - Fish and lean poultry: at least 2 a week  - Healthy protein sources include, chicken, turkey, lean beef, lamb - Increase intake of seafood such as tuna, salmon, trout, mackerel, shrimp, scallops - Avoid or limit high fat processed meats such as sausage and bacon  Dairy - Include moderate amounts of low fat dairy products  - Focus on healthy dairy such as fat free yogurt, skim milk, low or reduced fat cheese - Limit dairy products higher in fat such as whole or 2% milk, cheese, ice cream  Alcohol - Moderate amounts of red wine is ok  - No more than 5 oz daily for women (all ages) and men older than age 57  - No more than 10 oz of wine daily for men younger than 88  Other - Limit  sweets and other desserts  - Use herbs and spices instead of salt to flavor foods  - Herbs and spices common to the traditional Mediterranean Diet include: basil, bay leaves, chives, cloves, cumin, fennel, garlic, lavender, marjoram, mint, oregano, parsley, pepper, rosemary, sage, savory, sumac, tarragon, thyme   Its not just a diet, its a lifestyle:  The Mediterranean diet includes lifestyle factors typical of those in the region  Foods, drinks and meals are best eaten with others and savored Daily physical activity is important for overall good health This could be strenuous exercise like running and aerobics This could also be more leisurely activities such as walking, housework, yard-work, or taking the stairs Moderation is the key; a balanced and healthy  diet accommodates most foods and drinks Consider portion sizes and frequency of consumption of certain foods   Meal Ideas & Options:  Breakfast:  Whole wheat toast or whole wheat English muffins with peanut butter & hard boiled egg Steel cut oats topped with apples & cinnamon and skim milk  Fresh fruit: banana, strawberries, melon, berries, peaches  Smoothies: strawberries, bananas, greek yogurt, peanut butter Low fat greek yogurt with blueberries and granola  Egg white omelet with spinach and mushrooms Breakfast couscous: whole wheat couscous, apricots, skim milk, cranberries  Sandwiches:  Hummus and grilled vegetables (peppers, zucchini, squash) on whole wheat bread   Grilled chicken on whole wheat pita with lettuce, tomatoes, cucumbers or tzatziki  Tuna salad on whole wheat bread: tuna salad made with greek yogurt, olives, red peppers, capers, green onions Garlic rosemary lamb pita: lamb sauted with garlic, rosemary, salt & pepper; add lettuce, cucumber, greek yogurt to pita - flavor with lemon juice and black pepper  Seafood:  Mediterranean grilled salmon, seasoned with garlic, basil, parsley, lemon juice and black  pepper Shrimp, lemon, and spinach whole-grain pasta salad made with low fat greek yogurt  Seared scallops with lemon orzo  Seared tuna steaks seasoned salt, pepper, coriander topped with tomato mixture of olives, tomatoes, olive oil, minced garlic, parsley, green onions and cappers  Meats:  Herbed greek chicken salad with kalamata olives, cucumber, feta  Red bell peppers stuffed with spinach, bulgur, lean ground beef (or lentils) & topped with feta   Kebabs: skewers of chicken, tomatoes, onions, zucchini, squash  Turkey burgers: made with red onions, mint, dill, lemon juice, feta cheese topped with roasted red peppers Vegetarian Cucumber salad: cucumbers, artichoke hearts, celery, red onion, feta cheese, tossed in olive oil & lemon juice  Hummus and whole grain pita points with a greek salad (lettuce, tomato, feta, olives, cucumbers, red onion) Lentil soup with celery, carrots made with vegetable broth, garlic, salt and pepper  Tabouli salad: parsley, bulgur, mint, scallions, cucumbers, tomato, radishes, lemon juice, olive oil, salt and pepper.

## 2024-02-11 ENCOUNTER — Encounter (HOSPITAL_COMMUNITY): Payer: Self-pay

## 2024-02-13 ENCOUNTER — Ambulatory Visit (HOSPITAL_COMMUNITY)
Admission: RE | Admit: 2024-02-13 | Discharge: 2024-02-13 | Disposition: A | Discharge status: Home / Self Care | Source: Ambulatory Visit | Attending: Nurse Practitioner | Admitting: Nurse Practitioner

## 2024-02-13 DIAGNOSIS — I2510 Atherosclerotic heart disease of native coronary artery without angina pectoris: Secondary | ICD-10-CM

## 2024-02-13 DIAGNOSIS — Z7689 Persons encountering health services in other specified circumstances: Secondary | ICD-10-CM | POA: Insufficient documentation

## 2024-02-13 DIAGNOSIS — Z7189 Other specified counseling: Secondary | ICD-10-CM | POA: Diagnosis present

## 2024-02-13 DIAGNOSIS — R931 Abnormal findings on diagnostic imaging of heart and coronary circulation: Secondary | ICD-10-CM | POA: Diagnosis present

## 2024-02-13 MED ORDER — IOHEXOL 350 MG/ML SOLN
100.0000 mL | Freq: Once | INTRAVENOUS | Status: AC | PRN
  Administered 2024-02-13: 100 mL via INTRAVENOUS

## 2024-02-13 MED ORDER — NITROGLYCERIN 0.4 MG SL SUBL
0.8000 mg | SUBLINGUAL_TABLET | Freq: Once | SUBLINGUAL | Status: AC
  Administered 2024-02-13: 0.8 mg via SUBLINGUAL

## 2024-02-14 ENCOUNTER — Ambulatory Visit (HOSPITAL_COMMUNITY)
Admission: RE | Admit: 2024-02-14 | Discharge: 2024-02-14 | Disposition: A | Discharge status: Home / Self Care | Source: Ambulatory Visit | Attending: Cardiology | Admitting: Cardiology

## 2024-02-14 ENCOUNTER — Other Ambulatory Visit: Payer: Self-pay | Admitting: Cardiology

## 2024-02-14 ENCOUNTER — Ambulatory Visit (HOSPITAL_BASED_OUTPATIENT_CLINIC_OR_DEPARTMENT_OTHER): Payer: Self-pay | Admitting: Nurse Practitioner

## 2024-02-14 DIAGNOSIS — R931 Abnormal findings on diagnostic imaging of heart and coronary circulation: Secondary | ICD-10-CM | POA: Diagnosis present

## 2024-02-18 ENCOUNTER — Other Ambulatory Visit (HOSPITAL_COMMUNITY): Payer: Self-pay

## 2024-02-18 ENCOUNTER — Encounter (HOSPITAL_BASED_OUTPATIENT_CLINIC_OR_DEPARTMENT_OTHER): Payer: Self-pay

## 2024-02-18 ENCOUNTER — Telehealth (HOSPITAL_BASED_OUTPATIENT_CLINIC_OR_DEPARTMENT_OTHER): Payer: Self-pay

## 2024-02-18 ENCOUNTER — Other Ambulatory Visit (HOSPITAL_BASED_OUTPATIENT_CLINIC_OR_DEPARTMENT_OTHER): Payer: Self-pay

## 2024-02-18 ENCOUNTER — Other Ambulatory Visit: Payer: Self-pay

## 2024-02-18 MED ORDER — REPATHA SURECLICK 140 MG/ML ~~LOC~~ SOAJ
140.0000 mg | SUBCUTANEOUS | 3 refills | Status: AC
  Filled 2024-02-18: qty 6, 84d supply, fill #0

## 2024-02-19 ENCOUNTER — Other Ambulatory Visit (HOSPITAL_COMMUNITY): Payer: Self-pay

## 2024-02-19 ENCOUNTER — Telehealth: Payer: Self-pay | Admitting: Pharmacy Technician

## 2024-02-19 NOTE — Telephone Encounter (Signed)
 PA request has been Submitted. New Encounter has been or will be created for follow up. For additional info see Pharmacy Prior Auth telephone encounter from 02/19/24.

## 2024-02-20 ENCOUNTER — Other Ambulatory Visit (HOSPITAL_COMMUNITY): Payer: Self-pay

## 2024-02-20 NOTE — Telephone Encounter (Signed)
 Pharmacy Patient Advocate Encounter  Received notification from [facility] that Prior Authorization for repatha  has been APPROVED from 02/19/24 to 02/18/27   PA #/Case ID/Reference #: [id number]

## 2024-02-21 ENCOUNTER — Telehealth: Payer: Self-pay | Admitting: Pharmacy Technician

## 2024-02-21 ENCOUNTER — Other Ambulatory Visit (HOSPITAL_COMMUNITY): Payer: Self-pay

## 2024-02-21 ENCOUNTER — Other Ambulatory Visit (HOSPITAL_BASED_OUTPATIENT_CLINIC_OR_DEPARTMENT_OTHER): Payer: Self-pay

## 2024-02-21 NOTE — Telephone Encounter (Signed)
 [name]

## 2024-04-30 ENCOUNTER — Ambulatory Visit (HOSPITAL_BASED_OUTPATIENT_CLINIC_OR_DEPARTMENT_OTHER): Admitting: Nurse Practitioner

## 2024-11-26 ENCOUNTER — Encounter: Admitting: Physician Assistant
